# Patient Record
Sex: Female | Born: 1970
Health system: Southern US, Community
[De-identification: ages and names within clinical notes are randomized; demographics above are authoritative.]

## PROBLEM LIST (undated history)

## (undated) DIAGNOSIS — R112 Nausea with vomiting, unspecified: Secondary | ICD-10-CM

## (undated) DIAGNOSIS — I10 Essential (primary) hypertension: Secondary | ICD-10-CM

## (undated) DIAGNOSIS — Z9889 Other specified postprocedural states: Secondary | ICD-10-CM

## (undated) DIAGNOSIS — R55 Syncope and collapse: Secondary | ICD-10-CM

## (undated) DIAGNOSIS — T7840XA Allergy, unspecified, initial encounter: Secondary | ICD-10-CM

## (undated) DIAGNOSIS — R002 Palpitations: Secondary | ICD-10-CM

## (undated) DIAGNOSIS — R51 Headache: Secondary | ICD-10-CM

## (undated) HISTORY — PX: ABDOMINAL HYSTERECTOMY: SHX81

## (undated) HISTORY — DX: Palpitations: R00.2

## (undated) HISTORY — PX: LUMBAR DISC SURGERY: SHX700

## (undated) HISTORY — DX: Allergy, unspecified, initial encounter: T78.40XA

---

## 2008-03-29 ENCOUNTER — Encounter: Admission: RE | Admit: 2008-03-29 | Discharge: 2008-03-29 | Payer: Self-pay | Admitting: Family Medicine

## 2008-04-14 ENCOUNTER — Encounter: Admission: RE | Admit: 2008-04-14 | Discharge: 2008-04-14 | Payer: Self-pay | Admitting: Family Medicine

## 2009-07-09 ENCOUNTER — Encounter: Admission: RE | Admit: 2009-07-09 | Discharge: 2009-07-09 | Payer: Self-pay | Admitting: Family Medicine

## 2009-07-11 DIAGNOSIS — E559 Vitamin D deficiency, unspecified: Secondary | ICD-10-CM | POA: Insufficient documentation

## 2009-09-01 DIAGNOSIS — R55 Syncope and collapse: Secondary | ICD-10-CM

## 2009-09-01 HISTORY — PX: OTHER SURGICAL HISTORY: SHX169

## 2009-09-01 HISTORY — DX: Syncope and collapse: R55

## 2009-11-24 IMAGING — MG MM SCREENING MAMMOGRAM
4 series · 4 of 4 positions shown · non-contrast
Comparison: none

DG SCREEN MAMMOGRAM BILATERAL
Bilateral CC and MLO view(s) were taken.
Technologist: Giorgi Jumper

DIGITAL SCREENING MAMMOGRAM WITH CAD:
There are scattered fibroglandular densities.  Multiple nodules are noted in both breasts.  Spot 
compression views and possibly sonography are recommended for further evaluation.

[R CC]
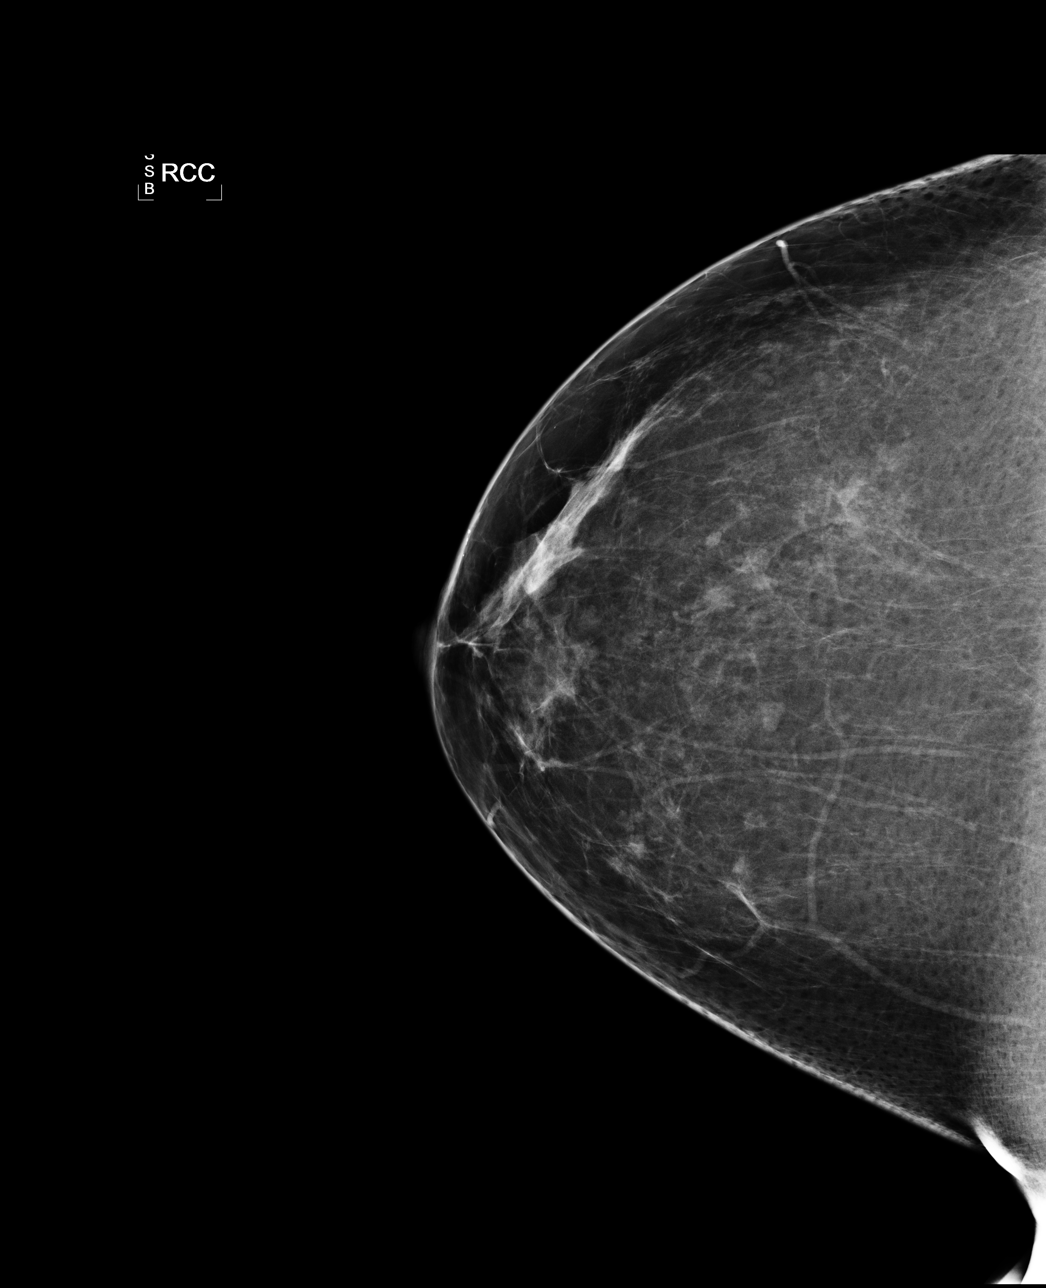

[L CC]
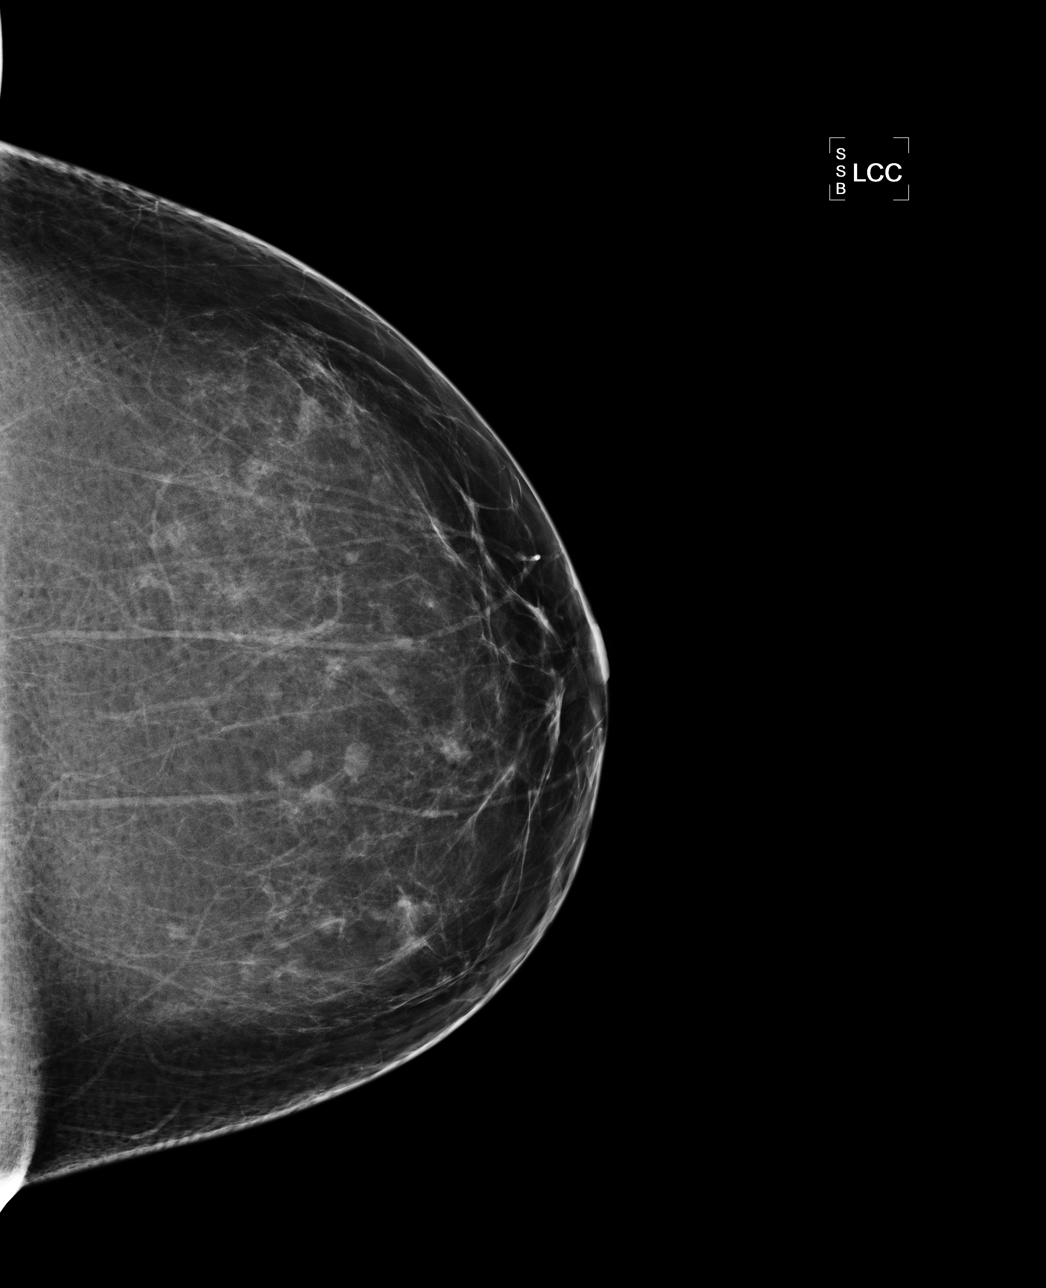

[L MLO]
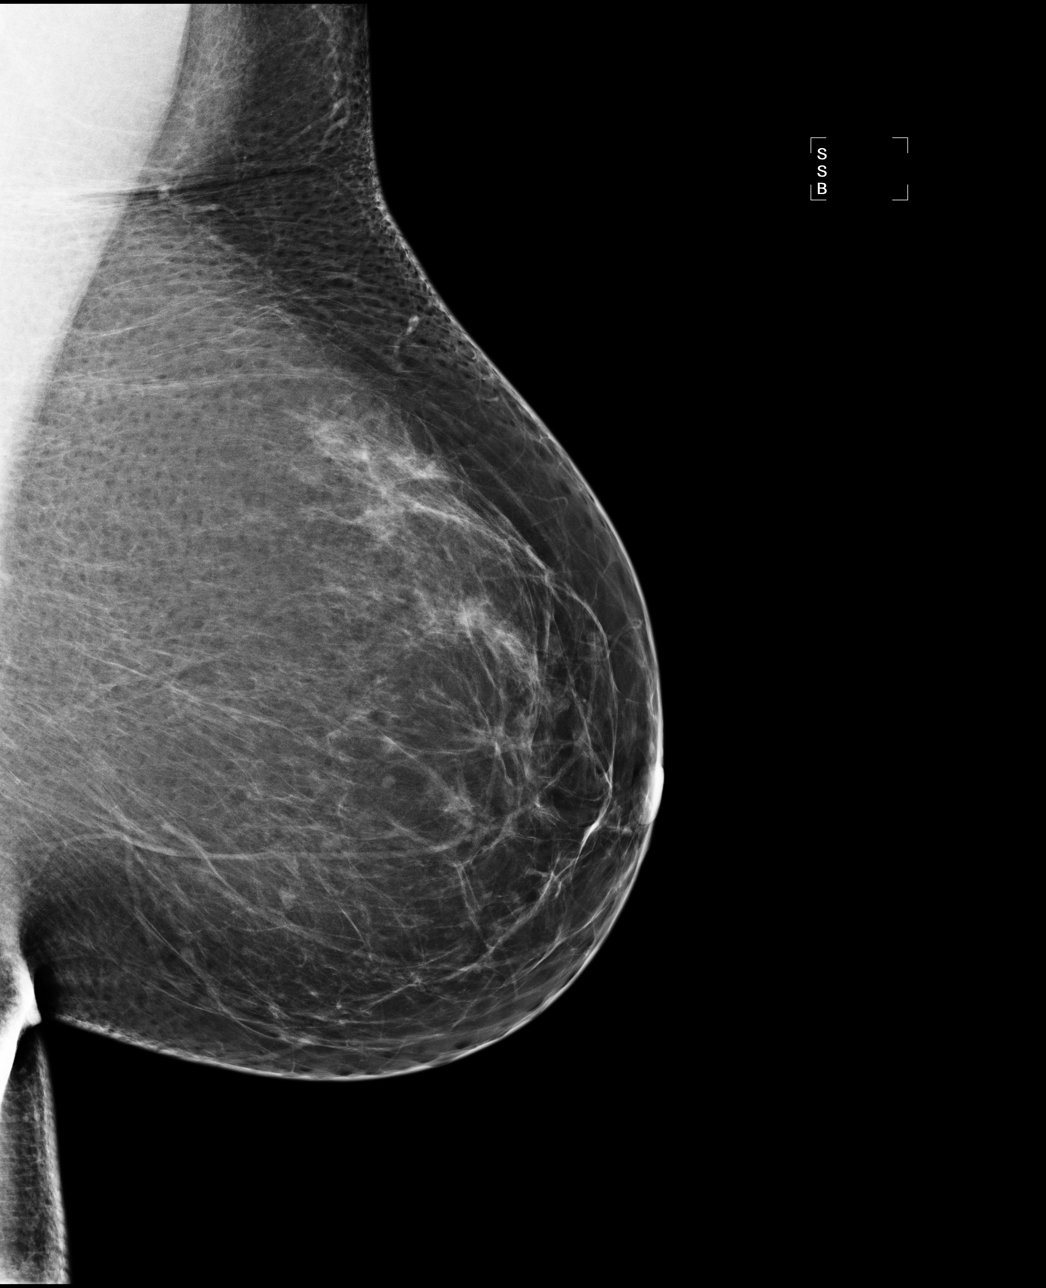

[R MLO]
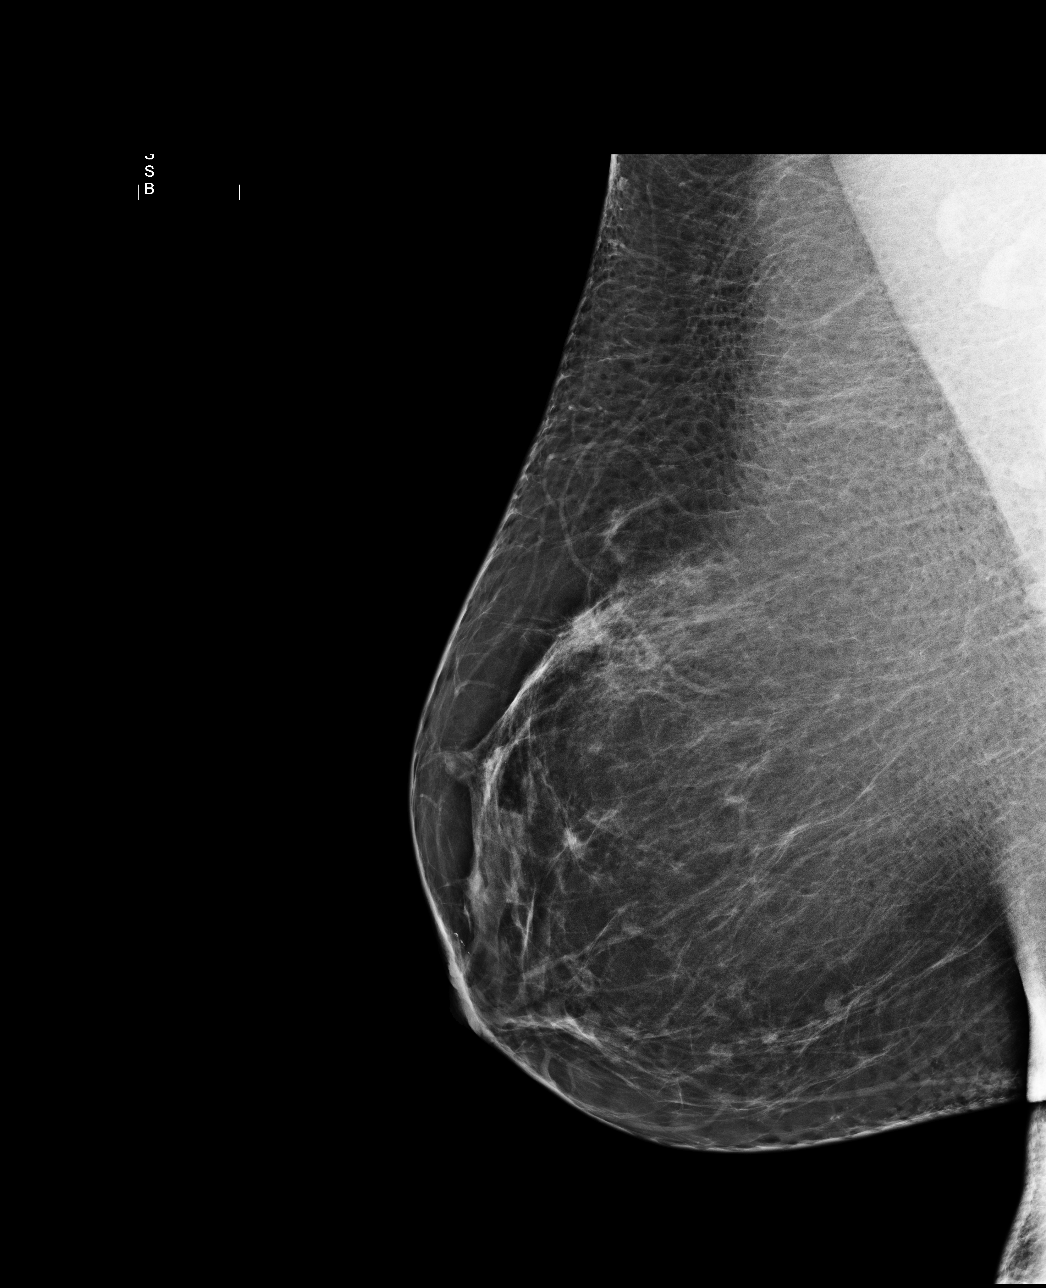

[4 of 4 positions shown; findings below may reference images not displayed]

IMPRESSION: Possible nodules, bilateral breasts.  Additional evaluation is indicated.  The patient will be 
contacted for additional studies and a supplementary report will follow.

ASSESSMENT: Need additional imaging evaluation and/or prior mammograms for comparison - BI-RADS 0

Further imaging of both breasts.
ANALYZED BY COMPUTER AIDED DETECTION. , THIS PROCEDURE WAS A DIGITAL MAMMOGRAM.

## 2010-09-21 ENCOUNTER — Other Ambulatory Visit: Payer: Self-pay | Admitting: Obstetrics

## 2010-09-21 DIAGNOSIS — Z0000 Encounter for general adult medical examination without abnormal findings: Secondary | ICD-10-CM

## 2010-09-21 DIAGNOSIS — Z1231 Encounter for screening mammogram for malignant neoplasm of breast: Secondary | ICD-10-CM

## 2010-10-03 ENCOUNTER — Ambulatory Visit: Payer: Self-pay

## 2010-10-10 ENCOUNTER — Ambulatory Visit
Admission: RE | Admit: 2010-10-10 | Discharge: 2010-10-10 | Disposition: A | Discharge status: Home / Self Care | Payer: PRIVATE HEALTH INSURANCE | Source: Ambulatory Visit | Attending: Obstetrics | Admitting: Obstetrics

## 2010-10-10 DIAGNOSIS — Z1231 Encounter for screening mammogram for malignant neoplasm of breast: Secondary | ICD-10-CM

## 2011-06-03 ENCOUNTER — Encounter: Payer: PRIVATE HEALTH INSURANCE | Admitting: Family Medicine

## 2011-06-03 NOTE — Progress Notes (Signed)
This encounter was created in error - please disregard.

## 2011-11-03 ENCOUNTER — Encounter (HOSPITAL_COMMUNITY): Payer: Self-pay | Admitting: Pharmacist

## 2011-11-14 ENCOUNTER — Other Ambulatory Visit: Payer: Self-pay | Admitting: Obstetrics

## 2011-11-17 ENCOUNTER — Other Ambulatory Visit: Payer: Self-pay

## 2011-11-17 ENCOUNTER — Encounter (HOSPITAL_COMMUNITY)
Admission: RE | Admit: 2011-11-17 | Discharge: 2011-11-17 | Disposition: A | Discharge status: Home / Self Care | Payer: No Typology Code available for payment source | Source: Ambulatory Visit | Attending: Obstetrics | Admitting: Obstetrics

## 2011-11-17 ENCOUNTER — Encounter (HOSPITAL_COMMUNITY): Payer: Self-pay

## 2011-11-17 HISTORY — DX: Syncope and collapse: R55

## 2011-11-17 HISTORY — DX: Nausea with vomiting, unspecified: R11.2

## 2011-11-17 HISTORY — DX: Headache: R51

## 2011-11-17 HISTORY — DX: Essential (primary) hypertension: I10

## 2011-11-17 HISTORY — DX: Nausea with vomiting, unspecified: Z98.890

## 2011-11-17 LAB — BASIC METABOLIC PANEL
CO2: 29 mEq/L (ref 19–32)
GFR calc non Af Amer: >90 mL/min (ref >90)
Glucose, Bld: 71 mg/dL (ref 70–99)
Potassium: 3.4 mEq/L — ABNORMAL LOW (ref 3.5–5.1)
Sodium: 141 mEq/L (ref 135–145)

## 2011-11-17 LAB — CBC
Hemoglobin: 14 g/dL (ref 12.0–15.0)
MCH: 27 pg (ref 26.0–34.0)
Platelets: 224 10*3/uL (ref 150–400)
RBC: 5.18 MIL/uL — ABNORMAL HIGH (ref 3.87–5.11)
WBC: 6.4 10*3/uL (ref 4.0–10.5)

## 2011-11-17 LAB — SURGICAL PCR SCREEN: Staphylococcus aureus: NEGATIVE

## 2011-11-17 NOTE — Patient Instructions (Addendum)
   Your procedure is scheduled on: Monday March 25th  Enter through the Main Entrance of [facility] at: [employer] up the phone at the desk and dial [phone] and inform us of your arrival.  Please call this number if you have any problems the morning of surgery: [phone]  Remember: Do not eat food after midnight: Sunday Do not drink clear liquids after: midnight Sunday Take these medicines the morning of surgery with a SIP OF WATER: lisinopril  Do not wear jewelry, make-up, or FINGER nail polish Do not wear lotions, powders, perfumes or deodorant. Do not shave 48 hours prior to surgery. Do not bring valuables to the hospital. Contacts, dentures or bridgework may not be worn into surgery.  Leave suitcase in the car. After Surgery it may be brought to your room. For patients being admitted to the hospital, checkout time is 11:00am the day of discharge.  Patients discharged on the day of surgery will not be allowed to drive home.     Remember to use your hibiclens as instructed.Please shower with 1/2 bottle the evening before your surgery and the other 1/2 bottle the morning of surgery. Neck down avoiding private area.

## 2011-11-23 MED ORDER — CEFAZOLIN SODIUM-DEXTROSE 2-3 GM-% IV SOLR
2.0000 g | Freq: Once | INTRAVENOUS | Status: AC
  Administered 2011-11-24: 2 g via INTRAVENOUS
  Filled 2011-11-23: qty 50

## 2011-11-24 ENCOUNTER — Encounter (HOSPITAL_COMMUNITY)
Admission: RE | Disposition: A | Discharge status: Home / Self Care | Payer: Self-pay | Source: Ambulatory Visit | Attending: Obstetrics

## 2011-11-24 ENCOUNTER — Encounter (HOSPITAL_COMMUNITY): Payer: Self-pay | Admitting: Anesthesiology

## 2011-11-24 ENCOUNTER — Ambulatory Visit (HOSPITAL_COMMUNITY)
Admission: RE | Admit: 2011-11-24 | Discharge: 2011-11-25 | Disposition: A | Discharge status: Home / Self Care | Payer: No Typology Code available for payment source | Source: Ambulatory Visit | Attending: Obstetrics | Admitting: Obstetrics

## 2011-11-24 ENCOUNTER — Encounter (HOSPITAL_COMMUNITY): Payer: Self-pay | Admitting: General Practice

## 2011-11-24 ENCOUNTER — Encounter (HOSPITAL_COMMUNITY): Payer: Self-pay

## 2011-11-24 ENCOUNTER — Ambulatory Visit (HOSPITAL_COMMUNITY): Payer: No Typology Code available for payment source | Admitting: Anesthesiology

## 2011-11-24 DIAGNOSIS — N920 Excessive and frequent menstruation with regular cycle: Secondary | ICD-10-CM | POA: Insufficient documentation

## 2011-11-24 DIAGNOSIS — Z01818 Encounter for other preprocedural examination: Secondary | ICD-10-CM | POA: Insufficient documentation

## 2011-11-24 DIAGNOSIS — I10 Essential (primary) hypertension: Secondary | ICD-10-CM | POA: Insufficient documentation

## 2011-11-24 DIAGNOSIS — Z01812 Encounter for preprocedural laboratory examination: Secondary | ICD-10-CM | POA: Insufficient documentation

## 2011-11-24 DIAGNOSIS — N946 Dysmenorrhea, unspecified: Secondary | ICD-10-CM | POA: Insufficient documentation

## 2011-11-24 LAB — TYPE AND SCREEN: Antibody Screen: NEGATIVE

## 2011-11-24 LAB — PREGNANCY, URINE: Preg Test, Ur: NEGATIVE

## 2011-11-24 LAB — ABO/RH: ABO/RH(D): A POS

## 2011-11-24 LAB — CBC
HCT: 41.6 % (ref 36.0–46.0)
MCHC: 32.5 g/dL (ref 30.0–36.0)
MCV: 83.9 fL (ref 78.0–100.0)
Platelets: 189 10*3/uL (ref 150–400)
RDW: 13.6 % (ref 11.5–15.5)

## 2011-11-24 SURGERY — ROBOTIC ASSISTED TOTAL HYSTERECTOMY
Anesthesia: General | Site: Abdomen | Wound class: Clean Contaminated

## 2011-11-24 MED ORDER — FENTANYL CITRATE 0.05 MG/ML IJ SOLN
INTRAMUSCULAR | Status: DC | PRN
  Administered 2011-11-24: 100 ug via INTRAVENOUS
  Administered 2011-11-24 (×3): 50 ug via INTRAVENOUS

## 2011-11-24 MED ORDER — LIDOCAINE HCL (CARDIAC) 20 MG/ML IV SOLN
INTRAVENOUS | Status: AC
  Filled 2011-11-24: qty 5

## 2011-11-24 MED ORDER — HYDROMORPHONE HCL PF 1 MG/ML IJ SOLN
INTRAMUSCULAR | Status: DC | PRN
  Administered 2011-11-24: 1 mg via INTRAVENOUS

## 2011-11-24 MED ORDER — KETOROLAC TROMETHAMINE 30 MG/ML IJ SOLN
15.0000 mg | Freq: Once | INTRAMUSCULAR | Status: AC | PRN
  Administered 2011-11-24: 30 mg via INTRAVENOUS

## 2011-11-24 MED ORDER — ONDANSETRON HCL 4 MG/2ML IJ SOLN: 4.0000 mg | Freq: Four times a day (QID) | INTRAMUSCULAR | Status: DC | PRN

## 2011-11-24 MED ORDER — SCOPOLAMINE 1 MG/3DAYS TD PT72
1.0000 | MEDICATED_PATCH | Freq: Once | TRANSDERMAL | Status: DC | PRN
  Administered 2011-11-24: 1.5 mg via TRANSDERMAL

## 2011-11-24 MED ORDER — GLYCOPYRROLATE 0.2 MG/ML IJ SOLN
INTRAMUSCULAR | Status: DC | PRN
  Administered 2011-11-24: .8 mg via INTRAVENOUS
  Administered 2011-11-24: 0.1 mg via INTRAVENOUS
  Administered 2011-11-24: 0.3 mg via INTRAVENOUS

## 2011-11-24 MED ORDER — ARTIFICIAL TEARS OP OINT
TOPICAL_OINTMENT | OPHTHALMIC | Status: DC | PRN
  Administered 2011-11-24: 1 via OPHTHALMIC

## 2011-11-24 MED ORDER — MENTHOL 3 MG MT LOZG: 1.0000 | LOZENGE | OROMUCOSAL | Status: DC | PRN

## 2011-11-24 MED ORDER — KETOROLAC TROMETHAMINE 30 MG/ML IJ SOLN
30.0000 mg | Freq: Four times a day (QID) | INTRAMUSCULAR | Status: DC
  Filled 2011-11-24: qty 1

## 2011-11-24 MED ORDER — IBUPROFEN 600 MG PO TABS: 600.0000 mg | ORAL_TABLET | Freq: Four times a day (QID) | ORAL | Status: DC | PRN

## 2011-11-24 MED ORDER — FENTANYL CITRATE 0.05 MG/ML IJ SOLN
25.0000 ug | INTRAMUSCULAR | Status: DC | PRN
  Administered 2011-11-24 (×2): 50 ug via INTRAVENOUS

## 2011-11-24 MED ORDER — DEXAMETHASONE SODIUM PHOSPHATE 10 MG/ML IJ SOLN
INTRAMUSCULAR | Status: AC
  Filled 2011-11-24: qty 1

## 2011-11-24 MED ORDER — ACETAMINOPHEN 325 MG PO TABS: 325.0000 mg | ORAL_TABLET | ORAL | Status: DC | PRN

## 2011-11-24 MED ORDER — NEOSTIGMINE METHYLSULFATE 1 MG/ML IJ SOLN
INTRAMUSCULAR | Status: DC | PRN
  Administered 2011-11-24: 4 mg via INTRAVENOUS

## 2011-11-24 MED ORDER — KETOROLAC TROMETHAMINE 30 MG/ML IJ SOLN
30.0000 mg | Freq: Four times a day (QID) | INTRAMUSCULAR | Status: DC
  Administered 2011-11-24 – 2011-11-25 (×3): 30 mg via INTRAVENOUS
  Filled 2011-11-24 (×2): qty 1

## 2011-11-24 MED ORDER — SCOPOLAMINE 1 MG/3DAYS TD PT72
MEDICATED_PATCH | TRANSDERMAL | Status: AC
  Administered 2011-11-24: 1.5 mg via TRANSDERMAL
  Filled 2011-11-24: qty 1

## 2011-11-24 MED ORDER — KETOROLAC TROMETHAMINE 30 MG/ML IJ SOLN
INTRAMUSCULAR | Status: AC
  Administered 2011-11-24: 30 mg via INTRAVENOUS
  Filled 2011-11-24: qty 1

## 2011-11-24 MED ORDER — EPHEDRINE 5 MG/ML INJ
INTRAVENOUS | Status: AC
  Filled 2011-11-24: qty 10

## 2011-11-24 MED ORDER — SODIUM CHLORIDE 0.9 % IV SOLN: INTRAVENOUS | Status: DC

## 2011-11-24 MED ORDER — LIDOCAINE HCL (CARDIAC) 20 MG/ML IV SOLN
INTRAVENOUS | Status: DC | PRN
  Administered 2011-11-24: 60 mg via INTRAVENOUS

## 2011-11-24 MED ORDER — PROPOFOL 10 MG/ML IV EMUL
INTRAVENOUS | Status: AC
  Filled 2011-11-24: qty 20

## 2011-11-24 MED ORDER — ROCURONIUM BROMIDE 100 MG/10ML IV SOLN
INTRAVENOUS | Status: DC | PRN
  Administered 2011-11-24: 45 mg via INTRAVENOUS
  Administered 2011-11-24: 5 mg via INTRAVENOUS
  Administered 2011-11-24: 20 mg via INTRAVENOUS

## 2011-11-24 MED ORDER — EPHEDRINE SULFATE 50 MG/ML IJ SOLN
INTRAMUSCULAR | Status: DC | PRN
  Administered 2011-11-24: 10 mg via INTRAVENOUS

## 2011-11-24 MED ORDER — MIDAZOLAM HCL 2 MG/2ML IJ SOLN: 0.5000 mg | Freq: Once | INTRAMUSCULAR | Status: DC | PRN

## 2011-11-24 MED ORDER — GLYCOPYRROLATE 0.2 MG/ML IJ SOLN
INTRAMUSCULAR | Status: AC
  Filled 2011-11-24: qty 1

## 2011-11-24 MED ORDER — PROPOFOL 10 MG/ML IV EMUL
INTRAVENOUS | Status: DC | PRN
  Administered 2011-11-24: 160 mg via INTRAVENOUS

## 2011-11-24 MED ORDER — HYDROMORPHONE HCL PF 1 MG/ML IJ SOLN
INTRAMUSCULAR | Status: AC
  Filled 2011-11-24: qty 1

## 2011-11-24 MED ORDER — LISINOPRIL 20 MG PO TABS
30.0000 mg | ORAL_TABLET | Freq: Every morning | ORAL | Status: DC
  Administered 2011-11-24: 30 mg via ORAL
  Filled 2011-11-24 (×2): qty 1

## 2011-11-24 MED ORDER — HYDROMORPHONE HCL PF 1 MG/ML IJ SOLN: 1.0000 mg | INTRAMUSCULAR | Status: DC | PRN

## 2011-11-24 MED ORDER — OXYCODONE-ACETAMINOPHEN 5-325 MG PO TABS
1.0000 | ORAL_TABLET | ORAL | Status: DC | PRN
  Administered 2011-11-24 (×2): 1 via ORAL
  Administered 2011-11-25: 2 via ORAL
  Filled 2011-11-24: qty 2
  Filled 2011-11-24 (×2): qty 1

## 2011-11-24 MED ORDER — MIDAZOLAM HCL 2 MG/2ML IJ SOLN
INTRAMUSCULAR | Status: AC
  Filled 2011-11-24: qty 2

## 2011-11-24 MED ORDER — NEOSTIGMINE METHYLSULFATE 1 MG/ML IJ SOLN
INTRAMUSCULAR | Status: AC
  Filled 2011-11-24: qty 10

## 2011-11-24 MED ORDER — ONDANSETRON HCL 4 MG/2ML IJ SOLN
INTRAMUSCULAR | Status: AC
  Filled 2011-11-24: qty 2

## 2011-11-24 MED ORDER — GLYCOPYRROLATE 0.2 MG/ML IJ SOLN
INTRAMUSCULAR | Status: AC
  Filled 2011-11-24: qty 2

## 2011-11-24 MED ORDER — PROMETHAZINE HCL 25 MG/ML IJ SOLN: 6.2500 mg | INTRAMUSCULAR | Status: DC | PRN

## 2011-11-24 MED ORDER — ONDANSETRON HCL 4 MG/2ML IJ SOLN
INTRAMUSCULAR | Status: DC | PRN
  Administered 2011-11-24: 4 mg via INTRAVENOUS

## 2011-11-24 MED ORDER — ONDANSETRON HCL 4 MG PO TABS: 4.0000 mg | ORAL_TABLET | Freq: Four times a day (QID) | ORAL | Status: DC | PRN

## 2011-11-24 MED ORDER — DEXAMETHASONE SODIUM PHOSPHATE 10 MG/ML IJ SOLN
INTRAMUSCULAR | Status: DC | PRN
  Administered 2011-11-24: 10 mg via INTRAVENOUS

## 2011-11-24 MED ORDER — BUPIVACAINE HCL (PF) 0.25 % IJ SOLN
INTRAMUSCULAR | Status: AC
  Filled 2011-11-24: qty 30

## 2011-11-24 MED ORDER — MEPERIDINE HCL 25 MG/ML IJ SOLN: 6.2500 mg | INTRAMUSCULAR | Status: DC | PRN

## 2011-11-24 MED ORDER — ZOLPIDEM TARTRATE 5 MG PO TABS: 5.0000 mg | ORAL_TABLET | Freq: Every evening | ORAL | Status: DC | PRN

## 2011-11-24 MED ORDER — FENTANYL CITRATE 0.05 MG/ML IJ SOLN
INTRAMUSCULAR | Status: AC
  Administered 2011-11-24: 50 ug via INTRAVENOUS
  Filled 2011-11-24: qty 2

## 2011-11-24 MED ORDER — MIDAZOLAM HCL 5 MG/5ML IJ SOLN
INTRAMUSCULAR | Status: DC | PRN
  Administered 2011-11-24: 2 mg via INTRAVENOUS

## 2011-11-24 MED ORDER — BUPIVACAINE HCL (PF) 0.25 % IJ SOLN
INTRAMUSCULAR | Status: DC | PRN
  Administered 2011-11-24: 13 mL

## 2011-11-24 MED ORDER — LACTATED RINGERS IV SOLN
INTRAVENOUS | Status: DC
  Administered 2011-11-24 (×2): via INTRAVENOUS
  Administered 2011-11-24: 125 mL/h via INTRAVENOUS

## 2011-11-24 MED ORDER — ROCURONIUM BROMIDE 50 MG/5ML IV SOLN
INTRAVENOUS | Status: AC
  Filled 2011-11-24: qty 2

## 2011-11-24 MED ORDER — PANTOPRAZOLE SODIUM 40 MG PO TBEC
40.0000 mg | DELAYED_RELEASE_TABLET | Freq: Every day | ORAL | Status: DC
  Filled 2011-11-24 (×2): qty 1

## 2011-11-24 MED ORDER — FENTANYL CITRATE 0.05 MG/ML IJ SOLN
INTRAMUSCULAR | Status: AC
  Filled 2011-11-24: qty 5

## 2011-11-24 SURGICAL SUPPLY — 72 items
BAG URINE DRAINAGE (UROLOGICAL SUPPLIES) ×4 IMPLANT
BARRIER ADHS 3X4 INTERCEED (GAUZE/BANDAGES/DRESSINGS) IMPLANT
BLADE LAPAROSCOPIC MORCELL KIT (BLADE) IMPLANT
CABLE HIGH FREQUENCY MONO STRZ (ELECTRODE) ×4 IMPLANT
CATH FOLEY 3WAY  5CC 16FR (CATHETERS) ×1
CATH FOLEY 3WAY 5CC 16FR (CATHETERS) ×3 IMPLANT
CHLORAPREP W/TINT 26ML (MISCELLANEOUS) ×4 IMPLANT
CLOTH BEACON ORANGE TIMEOUT ST (SAFETY) ×4 IMPLANT
CONT PATH 16OZ SNAP LID 3702 (MISCELLANEOUS) ×4 IMPLANT
COVER MAYO STAND STRL (DRAPES) ×4 IMPLANT
COVER TABLE BACK 60X90 (DRAPES) ×8 IMPLANT
COVER TIP SHEARS 8 DVNC (MISCELLANEOUS) ×3 IMPLANT
COVER TIP SHEARS 8MM DA VINCI (MISCELLANEOUS) ×1
DECANTER SPIKE VIAL GLASS SM (MISCELLANEOUS) ×4 IMPLANT
DERMABOND ADVANCED (GAUZE/BANDAGES/DRESSINGS) ×1
DERMABOND ADVANCED .7 DNX12 (GAUZE/BANDAGES/DRESSINGS) ×3 IMPLANT
DRAPE HUG U DISPOSABLE (DRAPE) ×4 IMPLANT
DRAPE LG THREE QUARTER DISP (DRAPES) ×8 IMPLANT
DRAPE MONITOR DA VINCI (DRAPE) ×4 IMPLANT
DRAPE WARM FLUID 44X44 (DRAPE) ×4 IMPLANT
ELECT REM PT RETURN 9FT ADLT (ELECTROSURGICAL) ×4
ELECTRODE REM PT RTRN 9FT ADLT (ELECTROSURGICAL) ×3 IMPLANT
EVACUATOR SMOKE 8.L (FILTER) ×4 IMPLANT
GAUZE VASELINE 3X9 (GAUZE/BANDAGES/DRESSINGS) IMPLANT
GLOVE BIO SURGEON STRL SZ 6.5 (GLOVE) ×16 IMPLANT
GLOVE BIO SURGEON STRL SZ7.5 (GLOVE) ×4 IMPLANT
GLOVE BIOGEL PI IND STRL 7.0 (GLOVE) ×9 IMPLANT
GLOVE BIOGEL PI INDICATOR 7.0 (GLOVE) ×3
GLOVE ECLIPSE 6.0 STRL STRAW (GLOVE) ×4 IMPLANT
GLOVE ECLIPSE 7.0 STRL STRAW (GLOVE) ×4 IMPLANT
GLOVE SS N UNI LF 7.0 STRL (GLOVE) ×4 IMPLANT
GOWN STRL REIN XL XLG (GOWN DISPOSABLE) ×32 IMPLANT
IV STOPCOCK 4 WAY 40  W/Y SET (IV SOLUTION) ×1
IV STOPCOCK 4 WAY 40 W/Y SET (IV SOLUTION) ×3 IMPLANT
KIT ACCESSORY DA VINCI DISP (KITS)
KIT ACCESSORY DVNC DISP (KITS) IMPLANT
KIT DISP ACCESSORY 4 ARM (KITS) ×4 IMPLANT
NEEDLE HYPO 22GX1.5 SAFETY (NEEDLE) IMPLANT
NEEDLE INSUFFLATION 14GA 120MM (NEEDLE) IMPLANT
OCCLUDER COLPOPNEUMO (BALLOONS) ×4 IMPLANT
PACK LAVH (CUSTOM PROCEDURE TRAY) ×4 IMPLANT
PAD PREP 24X48 CUFFED NSTRL (MISCELLANEOUS) ×8 IMPLANT
PLUG CATH AND CAP STER (CATHETERS) ×4 IMPLANT
PROTECTOR NERVE ULNAR (MISCELLANEOUS) ×8 IMPLANT
SET IRRIG TUBING LAPAROSCOPIC (IRRIGATION / IRRIGATOR) ×4 IMPLANT
SOLUTION ELECTROLUBE (MISCELLANEOUS) ×4 IMPLANT
SPONGE LAP 18X18 X RAY DECT (DISPOSABLE) IMPLANT
SUT VIC AB 0 CT1 27 (SUTURE) ×2
SUT VIC AB 0 CT1 27XBRD ANBCTR (SUTURE) ×6 IMPLANT
SUT VIC AB 0 CT1 27XBRD ANTBC (SUTURE) IMPLANT
SUT VIC AB 0 CT2 27 (SUTURE) IMPLANT
SUT VIC AB 2-0 CT1 27 (SUTURE)
SUT VIC AB 2-0 CT1 TAPERPNT 27 (SUTURE) IMPLANT
SUT VIC AB 4-0 PS2 27 (SUTURE) ×8 IMPLANT
SUT VICRYL 0 27 CT2 27 ABS (SUTURE) ×24 IMPLANT
SUT VICRYL 0 UR6 27IN ABS (SUTURE) ×8 IMPLANT
SYR 50ML LL SCALE MARK (SYRINGE) ×4 IMPLANT
SYSTEM CONVERTIBLE TROCAR (TROCAR) ×4 IMPLANT
TIP UTERINE 5.1X6CM LAV DISP (MISCELLANEOUS) IMPLANT
TIP UTERINE 6.7X10CM GRN DISP (MISCELLANEOUS) IMPLANT
TIP UTERINE 6.7X6CM WHT DISP (MISCELLANEOUS) IMPLANT
TIP UTERINE 6.7X8CM BLUE DISP (MISCELLANEOUS) ×4 IMPLANT
TOWEL OR 17X24 6PK STRL BLUE (TOWEL DISPOSABLE) ×12 IMPLANT
TROCAR 12M 150ML BLUNT (TROCAR) IMPLANT
TROCAR DISP BLADELESS 8 DVNC (TROCAR) ×3 IMPLANT
TROCAR DISP BLADELESS 8MM (TROCAR) ×1
TROCAR XCEL 12X100 BLDLESS (ENDOMECHANICALS) ×4 IMPLANT
TROCAR XCEL NON-BLD 5MMX100MML (ENDOMECHANICALS) ×4 IMPLANT
TROCAR Z-THREAD BLADED 12X100M (TROCAR) ×4 IMPLANT
TUBING FILTER THERMOFLATOR (ELECTROSURGICAL) ×8 IMPLANT
WARMER LAPAROSCOPE (MISCELLANEOUS) ×4 IMPLANT
WATER STERILE IRR 1000ML POUR (IV SOLUTION) ×12 IMPLANT

## 2011-11-24 NOTE — Anesthesia Preprocedure Evaluation (Signed)
Anesthesia Evaluation  Patient identified by MRN, date of birth, ID band Patient awake    Reviewed: Allergy & Precautions, H&P , Patient's Chart, lab work & pertinent test results, reviewed documented beta blocker date and time   History of Anesthesia Complications (+) PONV  Airway Mallampati: II TM Distance: >3 FB Neck ROM: full    Dental No notable dental hx.    Pulmonary neg pulmonary ROS,  breath sounds clear to auscultation  Pulmonary exam normal       Cardiovascular Exercise Tolerance: Good hypertension, negative cardio ROS  Rhythm:regular Rate:Normal     Neuro/Psych negative neurological ROS  negative psych ROS   GI/Hepatic negative GI ROS, Neg liver ROS,   Endo/Other  negative endocrine ROS  Renal/GU negative Renal ROS     Musculoskeletal   Abdominal   Peds  Hematology negative hematology ROS (+)   Anesthesia Other Findings PONV (postoperative nausea and vomiting)   vagal response per pt Hypertension   on lisinopril-will do ekg today    Headache   history of migraines Fainting episodes 2011 history of with blood draw, mammogram, post op 2011       Reproductive/Obstetrics negative OB ROS                           Anesthesia Physical Anesthesia Plan  ASA: II  Anesthesia Plan: General ETT   Post-op Pain Management:    Induction:   Airway Management Planned:   Additional Equipment:   Intra-op Plan:   Post-operative Plan:   Informed Consent: I have reviewed the patients History and Physical, chart, labs and discussed the procedure including the risks, benefits and alternatives for the proposed anesthesia with the patient or authorized representative who has indicated his/her understanding and acceptance.   Dental Advisory Given  Plan Discussed with: CRNA, Surgeon and Anesthesiologist  Anesthesia Plan Comments:         Anesthesia Quick Evaluation

## 2011-11-24 NOTE — H&P (Signed)
See full H&P under media tab  In short, 41 yo G0 w/ dysmenorrhea and menorrhagia. Nl pap, nl embx, nl appearing uterus. Pt tried IUD which made pain worse, despite proper placement. Not interested in hormonal management due to htn. Does not like the idea of ablation.  PMH: htn PSH: none PObH: G0, no h/o infertility, never attempted preg  Meds: lisinopril All: NKDA  PE: Filed Vitals:   11/24/11 0611  BP: 149/88  Pulse: 80  Temp: 98.1 F (36.7 C)  TempSrc: Oral  Resp: 16  SpO2: 100%   In office: CV- RRR Pulm: CTAB GU: 7 cm mobile uterus, limited cervical descent, nl vagina, no adenxal masses  Today: Abd: soft, NT LE: SCDs in place, no edema  CBC    Component Value Date/Time   WBC 6.4 11/17/2011 0846   RBC 5.18* 11/17/2011 0846   HGB 14.0 11/17/2011 0846   HCT 43.4 11/17/2011 0846   PLT 224 11/17/2011 0846   MCV 83.8 11/17/2011 0846   MCH 27.0 11/17/2011 0846   MCHC 32.3 11/17/2011 0846   RDW 13.5 11/17/2011 0846   A/P: 41 yo G0 for robotic assisted TLH w/ retention of ovaries for menorrhagia and dysmenorrhea - pt understands risks of surgery, including possible longer surgery w/ robotic approach leading to facial/ tracheal edema and possible difficult extubation. Pt also understands alternatives to surgery, that she has not exhausted all options and risks of bleeding, infections and surrounding damage. - htn, will restart meds post-op.  Chole Driver A. 11/24/2011 7:44 AM

## 2011-11-24 NOTE — Transfer of Care (Signed)
Immediate Anesthesia Transfer of Care Note  Patient: Tanya Carr  Procedure(s) Performed: Procedure(s) (LRB): ROBOTIC ASSISTED TOTAL HYSTERECTOMY (N/A) BILATERAL SALPINGECTOMY (Bilateral) ROBOTIC ASSISTED LAPAROSCOPIC LYSIS OF ADHESION (N/A)  Patient Location: PACU  Anesthesia Type: General  Level of Consciousness: awake, alert  and oriented  Airway & Oxygen Therapy: Patient Spontanous Breathing and Patient connected to nasal cannula oxygen  Post-op Assessment: Report given to PACU RN and Post -op Vital signs reviewed and stable  Post vital signs: Reviewed and stable  Complications: No apparent anesthesia complications

## 2011-11-24 NOTE — Addendum Note (Signed)
Addendum  created 11/24/11 1754 by Shanon Payor, CRNA   Modules edited:Notes Section

## 2011-11-24 NOTE — Op Note (Signed)
11/24/2011  10:39 AM  PATIENT:  Tanya Carr  41 y.o. female  PRE-OPERATIVE DIAGNOSIS:  Pelvic Pain, Dysmenorrhea  POST-OPERATIVE DIAGNOSIS:  Pelvic Pain, Dysmenorrhea  PROCEDURE:  Procedure(s) (LRB): ROBOTIC ASSISTED TOTAL HYSTERECTOMY (N/A) BILATERAL SALPINGECTOMY (Bilateral) ROBOTIC ASSISTED LAPAROSCOPIC LYSIS OF ADHESION (N/A)  SURGEON:  Surgeon(s) and Role:    Tresa Endo A. Ernestina Penna, MD - Primary    * Genia Del, MD - Assisting  PHYSICIAN ASSISTANT:   ASSISTANTS: Lavoie   ANESTHESIA:   general  EBL:  Total I/O In: 1000 [I.V.:1000] Out: 275 [Urine:200; Blood:75]  BLOOD ADMINISTERED:none and 0 CC PRBC  DRAINS: Urinary Catheter (Foley)   LOCAL MEDICATIONS USED:  MARCAINE     SPECIMEN:  Source of Specimen:  uterus, cervix, b/l tubes  DISPOSITION OF SPECIMEN:  PATHOLOGY Findings: nl uterus, tubes and ovaries, small adhesion L tube to ovary. Small bowel with filmy adhesions to RLQ side wall COUNTS:  YES  Abx: 1g Ancef Complications: none Indications: 41 yo G0 w/ bothersome menorrhagia and dsymenorrhea with limited uterine descent, failed trial of IUD and hypertension as the contraindication for OCs.   Procedure:Robotic-assisted total laparoscopic hysterectomy .  Procedure: After informed consent and discussion of alternatives to hysterectomy, the patient was taken to the operating room where general anesthesia was initiated without difficulty. She was prepped and draped in normal sterile fashion in the dorsal supine lithotomy position. A Foley catheter was inserted sterilely into the bladder. A bimanual examination was done to assess the size and position of the uterus.. A vaginal wall retractor was used on the anterior and posterior vagina and  the cervix was grasped with tenaculum. The cervix was sounded with the uterine sound. It sounded to 8 cm. The cervix was assessed to identify the Rumi-Co size.A small cup and an 8 cm shaft was used. This was easily  threaded through the cervix, into the uterus and the cup seated nicely around the cervix. The uterine balloon was inflated.  Gloved were changed. Attention was then turned to the patient's abdomen. 0.5 % marcaine was used prior to all incision. A total of 20 cc of marcaine was used.  A 10 mm incision was made in the umbilicus and blunt and sharp dissection was done until the fascia was identified. This was then grasped with Kocher clamps x2 and entered sharply. A pursestring suture of 0 Vicryl was then placed along the incision and a non-bladed Roseanne Reno was inserted into the peritoneal cavity. Intraperitoneal placement was confirmed with the use of the camera and pneumoperitoneum was created to 15 mm of mercury. The pursestring suture was secured around the port and pneumoperitoneum was maintained. Brief survey of the abdomen and pelvis was done with findings as above. The abdominal wall was assessed and additional port sites were marked. 8 mm incisions were placed in the right (two) and left lower quadrants (1) and non-bladed ports were placed under direct visualization. An additional 5 mm incision was made in LLQ and 5mm non-bladed port was placed under direct visualization.  The robot was brought to the patient's side and attached with the right side docking. The robotic instruments were placed under direct visualization until proper placement just over the uterus.  I then went to the robotic console. Brief survey of the patient's abdomen and pelvis revealed  findings as above. Bilateral ureters were identified and seen peristalsing well low in the pelvic sidewall. Bilateral tubes and ovaries were normal. . I began on the right side: the  right  tube  was serially cauterized with bipolar cautery with the use of the PK device and transected with the monopolar scissors. The  right utero-ovarian ligament was serially cauterized with the bipolar PK and transected with the monopolar scissors. The anterior leaf of the  broad ligament was dissected bluntly then monopolar cautery was used to separate the anterior and posterior leafs of the broad ligament. This was carried down through to the bladder flap. Attention was then turned to the patient's  left  side: the  left t tube was freed with monopolar scissored from it's attachment at the L ovary. The mesosalpynx was then cauterized and transected to the level of the utero-ovarian ligament,  the  left  uterine ovarian ligament was serially cauterized with the PK and transected with the monopolar scissors. The tube remained attached to the uterus. The left  broad ligament was opened up the anterior and posterior leaves were dissected apart from each other. Monopolar cautery was used to come across the anterior leaf of the right broad ligament. This dissection was carried down across to the midline to create the bladder flap. At this point the R mesosalpynx was serially cauterized and transected. The tube was freed and placed in the post cul-de-sac.  The right uterine artery was serially cauterized with the PK and transected with the monopolar scissors. The left uterine artery and then serially cauterized with the PK and transected with the monopolar scissors. The uterus was placed on stretch to allow better visualization of the arteries. The bladder flap was further taken down both sharply and with cautery. Cautery was used for hemostasis on several places along the bladder flap. At this point with the pressure inward on the Rumi, the posterior  colpotomy was made with the monopolar scissors. This was carried around to the patient's right side. Additional bipolar cautery was needed on the  both angles of the cuff- this was carried out with the PK bipolar. The uterus was then positioned  posteriorly  to allow easy access to the  anterior  colpotomy. This was carried out with the monopolar scissors.  Good hemostasis was noted along the angles of the incision.  The uterus, cervix and  b/l tubes were pulled out through the vagina.   Irrigation was used and the vaginal cuff appeared hemostatic. An 0 Vicryl on a CT-2 to was then placed through the #3 laparoscopic port. Instruments were changed to allow a suture cut needle driver through the #1 port and and a long-tipped forceps through the #2 port. The right angle was closed in a figure-of-eight fashion. Another suture was placed and the left angle was closed with a figure-of-eight suture. 4 additional sutures were then thrown along the vaginal cuff, all figure of 8 sutures with 0-vicryl. Excellent hemostasis was noted. Suction irrigation was carried out and hemostasis was assured along the vaginal cuff. The utero-ovarian ligaments were reassessed also found to be hemostatic. All free fluid was removed from the abdomen. T  The PK and monopolar scissors were introduced and the RLQ adhesions were taken down. Good hemostasis was noted.   The robotic portion was completed. The robot was undocked.   By standard laparoscopy the pelvis was irrigated and evaluated. The patient was placed in reverse Trendelenburg, all additional fluid was suctioned from the abdomen and pelvis the cuff was reinspected and  found to be hemostatic. All pedicles were also found to be hemostatic. Under direct visualization the ports were removed. Pneumoperitoneum was released and the umbilical port was removed.  The  pursestring suture at the umbilicus was then closed. No additional fascial incisions were closed due to the small size and the nondilated nature of these incisions. A 4-0 Vicryl was used to close the additional laparoscopic ports sites. Good hemostasis was noted.  Sponge lap and needle counts were correct x3 the patient was woken from general anesthesia having tolerated the procedure well and taken to the recovery room in a stable fashion.

## 2011-11-24 NOTE — Brief Op Note (Signed)
11/24/2011  10:39 AM  PATIENT:  Tanya Carr  41 y.o. female  PRE-OPERATIVE DIAGNOSIS:  Pelvic Pain, Dysmenorrhea  POST-OPERATIVE DIAGNOSIS:  Pelvic Pain, Dysmenorrhea  PROCEDURE:  Procedure(s) (LRB): ROBOTIC ASSISTED TOTAL HYSTERECTOMY (N/A) BILATERAL SALPINGECTOMY (Bilateral) ROBOTIC ASSISTED LAPAROSCOPIC LYSIS OF ADHESION (N/A)  SURGEON:  Surgeon(s) and Role:    Tresa Endo A. Ernestina Penna, MD - Primary    * Genia Del, MD - Assisting  PHYSICIAN ASSISTANT:   ASSISTANTS: Lavoie   ANESTHESIA:   general  EBL:  Total I/O In: 1000 [I.V.:1000] Out: 275 [Urine:200; Blood:75]  BLOOD ADMINISTERED:none and 0 CC PRBC  DRAINS: Urinary Catheter (Foley)   LOCAL MEDICATIONS USED:  MARCAINE     SPECIMEN:  Source of Specimen:  uterus, cervix, b/l tubes  DISPOSITION OF SPECIMEN:  PATHOLOGY  COUNTS:  YES  TOURNIQUET:  * No tourniquets in log *  DICTATION: .Note written in EPIC  PLAN OF CARE: Admit for overnight observation  PATIENT DISPOSITION:  PACU - hemodynamically stable.   Delay start of Pharmacological VTE agent (>24hrs) due to surgical blood loss or risk of bleeding: yes

## 2011-11-24 NOTE — Anesthesia Postprocedure Evaluation (Signed)
  Anesthesia Post-op Note  Patient: Tanya Carr  Procedure(s) Performed: Procedure(s) (LRB): ROBOTIC ASSISTED TOTAL HYSTERECTOMY (N/A) BILATERAL SALPINGECTOMY (Bilateral) ROBOTIC ASSISTED LAPAROSCOPIC LYSIS OF ADHESION (N/A)  Patient is awake and responsive. Pain and nausea are reasonably well controlled. Vital signs are stable and clinically acceptable. Oxygen saturation is clinically acceptable. There are no apparent anesthetic complications at this time. Patient is ready for discharge.

## 2011-11-24 NOTE — Anesthesia Procedure Notes (Signed)
Procedure Name: Intubation Date/Time: 11/24/2011 8:00 AM Performed by: Graciela Husbands Pre-anesthesia Checklist: Suction available, Emergency Drugs available, Timeout performed, Patient identified and Patient being monitored Patient Re-evaluated:Patient Re-evaluated prior to inductionOxygen Delivery Method: Circle system utilized Preoxygenation: Pre-oxygenation with 100% oxygen Intubation Type: IV induction Ventilation: Mask ventilation without difficulty Laryngoscope Size: Mac and 3 Grade View: Grade I Tube type: Oral Tube size: 7.0 mm Number of attempts: 1 Airway Equipment and Method: Stylet Placement Confirmation: ETT inserted through vocal cords under direct vision,  positive ETCO2 and breath sounds checked- equal and bilateral Secured at: 20 cm Tube secured with: Tape Dental Injury: Teeth and Oropharynx as per pre-operative assessment

## 2011-11-24 NOTE — Addendum Note (Signed)
Addendum  created 11/24/11 1357 by Graciela Husbands, CRNA   Modules edited:Anesthesia Responsible Staff

## 2011-11-24 NOTE — Anesthesia Postprocedure Evaluation (Signed)
  Anesthesia Post-op Note  Patient: Tanya Carr  Procedure(s) Performed: Procedure(s) (LRB): ROBOTIC ASSISTED TOTAL HYSTERECTOMY (N/A) BILATERAL SALPINGECTOMY (Bilateral) ROBOTIC ASSISTED LAPAROSCOPIC LYSIS OF ADHESION (N/A)  Patient Location: [facility]  Anesthesia Type: General  Level of Consciousness: awake, alert  and oriented  Airway and Oxygen Therapy: Patient Spontanous Breathing  Post-op Pain: none  Post-op Assessment: Post-op Vital signs reviewed and Patient's Cardiovascular Status Stable  Post-op Vital Signs: Reviewed and stable  Complications: No apparent anesthesia complications

## 2011-11-25 MED ORDER — IBUPROFEN 600 MG PO TABS: 600.0000 mg | ORAL_TABLET | Freq: Four times a day (QID) | ORAL | Status: AC | PRN

## 2011-11-25 MED ORDER — OXYCODONE-ACETAMINOPHEN 5-325 MG PO TABS: 2.0000 | ORAL_TABLET | ORAL | Status: AC | PRN

## 2011-11-25 MED ORDER — ONDANSETRON HCL 4 MG PO TABS: 4.0000 mg | ORAL_TABLET | Freq: Four times a day (QID) | ORAL | Status: AC | PRN

## 2011-11-25 NOTE — Progress Notes (Signed)
POD 1 s/p TLH- robotic assisted w/ LOA  No flatus, no N/V, tol reg po. Ambulating/ voiding w/o difficulty. Pain controlled w/ po meds. No vaginal bleeding, NO concerns, OR findings reviewed w/ pt. Pt notes blurry vision, no other neuro sx   PE  Filed Vitals:   11/24/11 2100 11/25/11 0155 11/25/11 0542 11/25/11 0544  BP: 113/73 100/58 152/70   Pulse: 61 61 99   Temp: 98.4 F (36.9 C) 98.1 F (36.7 C) 97.7 F (36.5 C)   TempSrc: Oral Oral Oral   Resp: 18 18 18    Height:      Weight:      SpO2: 100%   97%   Gen: well appearing CV: RRR Pulm: CTAB HEENT: scopolamine patch on Abd: soft, ND, approp tended Inc: well healing, dermabond LE: NT, no edema GU: no staining on pad  CBC    Component Value Date/Time   WBC 14.4* 11/24/2011 1330   RBC 4.96 11/24/2011 1330   HGB 13.5 11/24/2011 1330   HCT 41.6 11/24/2011 1330   PLT 189 11/24/2011 1330   MCV 83.9 11/24/2011 1330   MCH 27.2 11/24/2011 1330   MCHC 32.5 11/24/2011 1330   RDW 13.6 11/24/2011 1330    A/P; POD #1 TLH - appropriate post-op course, d/c home today, instructions reviewed, f/u in office 2 wks, warning signs d/w  Pt - scopolamine patch as cause for vision change - percocet at home for pain  Patient ID: Tanya Carr MRN: [id number] DOB/AGE: 1971/01/11 40 y.o.  Admit date: 11/24/2011 Discharge date: 11/25/11  Admission Diagnoses: Pelvic Pain, Dysmenorrhear  Discharge Diagnoses: Pelvic Pain, Dysmenorrhear         Discharged Condition: good  Hospital Course: normal post-op course  Consults: None and MFM  Treatments: surgery: TLH and b/l salpingectomy  Disposition: home   Medication List  As of 11/25/2011  8:01 AM   ASK your doctor about these medications         lisinopril 30 MG tablet   Commonly known as: PRINIVIL,ZESTRIL   Take 30 mg by mouth every morning.             Signed: Lendon Colonel., MD MD 11/25/2011, 8:01 AM

## 2011-11-25 NOTE — Progress Notes (Signed)
9604 nursing. Pt. Is discharged in the care of friend.Downstairs per ambulatory. Discharge instructions were given to pt, with good comprendison. Questions were asked and answered. Denies any further pain or discomfort. Voiding with out any difficulty. Stable.

## 2011-11-26 NOTE — Discharge Summary (Signed)
Physician Discharge Summary  Patient ID: Tanya Carr MRN: [id number] DOB/AGE: [birth year] 41 y.o.  Admit date: 11/24/2011 Discharge date: 11/25/11  Admission Diagnoses: Pelvic Pain, Dysmenorrhear  Discharge Diagnoses: Pelvic Pain, Dysmenorrhear        Active Problems:  * No active hospital problems. *    Discharged Condition: good  Hospital Course: Pt had uncomplicated robotic assisted TLH with bilateral salpingectomy. Post-operatively she did well and by time of d/c was ambulating, voiding w/o difficulty and had pain controlled with po meds and minimal vaginal spotting.   Consults: None  Treatments: surgery: robotic assisted TLH, b/l salpingectomy  Disposition: 01-Home or Self Care   Medication List  As of 11/26/2011  3:09 PM   TAKE these medications         ibuprofen 600 MG tablet   Commonly known as: ADVIL,MOTRIN   Take 1 tablet (600 mg total) by mouth every 6 (six) hours as needed for pain (mild pain).      lisinopril 30 MG tablet   Commonly known as: PRINIVIL,ZESTRIL   Take 30 mg by mouth every morning.      ondansetron 4 MG tablet   Commonly known as: ZOFRAN   Take 1 tablet (4 mg total) by mouth every 6 (six) hours as needed for nausea.      oxyCODONE-acetaminophen 5-325 MG per tablet   Commonly known as: PERCOCET   Take 2 tablets by mouth every 3 (three) hours as needed (moderate to severe pain (when tolerating fluids)).             Signed: Lendon Colonel., MD 11/26/2011, 3:09 PM

## 2011-12-18 ENCOUNTER — Other Ambulatory Visit (HOSPITAL_COMMUNITY): Payer: Self-pay | Admitting: Family Medicine

## 2011-12-18 DIAGNOSIS — Z1231 Encounter for screening mammogram for malignant neoplasm of breast: Secondary | ICD-10-CM

## 2011-12-19 ENCOUNTER — Ambulatory Visit (HOSPITAL_COMMUNITY)
Admission: RE | Admit: 2011-12-19 | Discharge: 2011-12-19 | Disposition: A | Discharge status: Home / Self Care | Payer: No Typology Code available for payment source | Source: Ambulatory Visit | Attending: Family Medicine | Admitting: Family Medicine

## 2011-12-19 DIAGNOSIS — Z1231 Encounter for screening mammogram for malignant neoplasm of breast: Secondary | ICD-10-CM | POA: Insufficient documentation

## 2013-02-16 ENCOUNTER — Other Ambulatory Visit: Payer: Self-pay | Admitting: Family Medicine

## 2013-02-16 DIAGNOSIS — Z1231 Encounter for screening mammogram for malignant neoplasm of breast: Secondary | ICD-10-CM

## 2013-03-24 ENCOUNTER — Encounter: Payer: Self-pay | Admitting: Family Medicine

## 2013-03-24 ENCOUNTER — Ambulatory Visit (HOSPITAL_COMMUNITY)
Admission: RE | Admit: 2013-03-24 | Discharge: 2013-03-24 | Disposition: A | Discharge status: Home / Self Care | Payer: No Typology Code available for payment source | Source: Ambulatory Visit | Attending: Family Medicine | Admitting: Family Medicine

## 2013-03-24 ENCOUNTER — Ambulatory Visit (INDEPENDENT_AMBULATORY_CARE_PROVIDER_SITE_OTHER): Payer: No Typology Code available for payment source | Admitting: Family Medicine

## 2013-03-24 ENCOUNTER — Ambulatory Visit: Payer: No Typology Code available for payment source | Admitting: Family Medicine

## 2013-03-24 VITALS — BP 120/82 | HR 76 | Temp 97.9°F | Ht 64.0 in | Wt 211.0 lb

## 2013-03-24 DIAGNOSIS — F4310 Post-traumatic stress disorder, unspecified: Secondary | ICD-10-CM | POA: Insufficient documentation

## 2013-03-24 DIAGNOSIS — I10 Essential (primary) hypertension: Secondary | ICD-10-CM | POA: Insufficient documentation

## 2013-03-24 DIAGNOSIS — R600 Localized edema: Secondary | ICD-10-CM | POA: Insufficient documentation

## 2013-03-24 DIAGNOSIS — R609 Edema, unspecified: Secondary | ICD-10-CM

## 2013-03-24 DIAGNOSIS — Z1231 Encounter for screening mammogram for malignant neoplasm of breast: Secondary | ICD-10-CM | POA: Insufficient documentation

## 2013-03-24 DIAGNOSIS — F4321 Adjustment disorder with depressed mood: Secondary | ICD-10-CM | POA: Insufficient documentation

## 2013-03-24 DIAGNOSIS — R252 Cramp and spasm: Secondary | ICD-10-CM | POA: Insufficient documentation

## 2013-03-24 LAB — COMPREHENSIVE METABOLIC PANEL
ALT: 20 U/L (ref 0–35)
AST: 19 U/L (ref 0–37)
Alkaline Phosphatase: 45 U/L (ref 39–117)
BUN: 16 mg/dL (ref 6–23)
Creatinine, Ser: 0.8 mg/dL (ref 0.4–1.2)
Total Bilirubin: 0.4 mg/dL (ref 0.3–1.2)

## 2013-03-24 LAB — CBC WITH DIFFERENTIAL/PLATELET
Basophils Relative: 0.5 % (ref 0.0–3.0)
Eosinophils Relative: 1.8 % (ref 0.0–5.0)
HCT: 42.3 % (ref 36.0–46.0)
Hemoglobin: 14 g/dL (ref 12.0–15.0)
Lymphs Abs: 1.6 10*3/uL (ref 0.7–4.0)
MCV: 84.1 fl (ref 78.0–100.0)
Monocytes Absolute: 0.4 10*3/uL (ref 0.1–1.0)
Neutro Abs: 5.8 10*3/uL (ref 1.4–7.7)
Neutrophils Relative %: 73 % (ref 43.0–77.0)
RBC: 5.03 Mil/uL (ref 3.87–5.11)
WBC: 8 10*3/uL (ref 4.5–10.5)

## 2013-03-24 MED ORDER — LISINOPRIL 30 MG PO TABS: 30.0000 mg | ORAL_TABLET | Freq: Every morning | ORAL | Status: DC

## 2013-03-24 MED ORDER — AMLODIPINE BESYLATE 5 MG PO TABS: 5.0000 mg | ORAL_TABLET | Freq: Every day | ORAL | Status: DC

## 2013-03-24 NOTE — Progress Notes (Signed)
Subjective:    Patient ID: Tanya Carr, female    DOB: [birth year], 42 y.o.   MRN: [id number]  HPI  42 yo G0 here to establish care.  Grief- has had a lot of loss over past 3 years including her mother, father and grandmother.  Feels she is coping the best she can.  Does have days when she is tearful and has anhedonia.  Denies panic attacks. No SI or HI.  HTN- has been stable on lisinopril 30 mg daily and amlodipine 5 mg daily. Had a CPX in 09/2012.  Brings in labs from previous doctor- LDL 110, TG 55, HDL 45.  Has mammogram scheduled for this afternoon.  Does complain of some cramping in her legs at night and some bilateral LE edema after she has been on her feet.  Concerned because her brother died of DVT/PE.  Patient Active Problem List   Diagnosis Date Noted  . Leg cramps 03/24/2013  . Lower extremity edema 03/24/2013  . PTSD (post-traumatic stress disorder) 03/24/2013  . Grief 03/24/2013  . Hypertension    Past Medical History  Diagnosis Date  . PONV (postoperative nausea and vomiting)     vagal response per pt  . Headache(784.0)     history of migraines  . Fainting episodes 2011    history of with blood draw, mammogram, post op 2011  . Hypertension     on lisinopril-will do ekg today   Past Surgical History  Procedure Laterality Date  . Ulnar nerve decompression  2011  . Abdominal hysterectomy     History  Substance Use Topics  . Smoking status: Former Smoker    Quit date: 11/16/1996  . Smokeless tobacco: Not on file  . Alcohol Use: Yes     Comment: rare   Family History  Problem Relation Age of Onset  . Cancer Mother   . Multiple sclerosis Mother   . COPD Father   . Pulmonary embolism Brother    Allergies  Allergen Reactions  . Food Hives    Seafood allergy: hives and itching   No current outpatient prescriptions on file prior to visit.   No current facility-administered medications on file prior to visit.   The PMH, PSH, Social  History, Family History, Medications, and allergies have been reviewed in [facility], and have been updated if relevant.   Review of Systems   See HPI No CP No SOB No blood in stool Objective:   Physical Exam BP 120/82  Pulse 76  Temp(Src) 97.9 F (36.6 C)  Ht 5\' 4"  (1.626 m)  Wt 211 lb (95.709 kg)  BMI 36.2 kg/m2  LMP 11/10/2011  General:  Well-developed,well-nourished,in no acute distress; alert,appropriate and cooperative throughout examination Head:  normocephalic and atraumatic.   Lungs:  Normal respiratory effort, chest expands symmetrically. Lungs are clear to auscultation, no crackles or wheezes. Heart:  Normal rate and regular rhythm. S1 and S2 normal without gallop, murmur, click, rub or other extra sounds. Abdomen:  Bowel sounds positive,abdomen soft and non-tender without masses, organomegaly or hernias noted. Msk:  No deformity or scoliosis noted of thoracic or lumbar spine.   Extremities:  No clubbing, cyanosis, edema, or deformity noted with normal full range of motion of all joints.   Neurologic:  alert & oriented X3 and gait normal.   Skin:  Intact without suspicious lesions or rashes Cervical Nodes:  No lymphadenopathy noted Axillary Nodes:  No palpable lymphadenopathy Psych:  Cognition and judgment appear intact. Alert and cooperative with normal  attention span and concentration. No apparent delusions, illusions, hallucinations     Assessment & Plan:  1. Hypertension Stable on current rx.  Rx refilled. - Comprehensive metabolic panel  2. Leg cramps Check electrolytes today.  3. Lower extremity edema Likely dependent edema.  Will check labs as well today. - TSH - CBC with Differential - T4, Free  4. PTSD (post-traumatic stress disorder) Discussed tx options.  Refusing medications and psychotherapy.  Has good support system.  5. Grief

## 2013-03-24 NOTE — Patient Instructions (Addendum)
Nice to meet you. I will call you with your lab results.  At your convenience, you can make an appointment for a physical.

## 2013-03-25 ENCOUNTER — Encounter: Payer: Self-pay | Admitting: *Deleted

## 2013-03-28 ENCOUNTER — Other Ambulatory Visit: Payer: Self-pay | Admitting: Family Medicine

## 2013-03-28 DIAGNOSIS — R928 Other abnormal and inconclusive findings on diagnostic imaging of breast: Secondary | ICD-10-CM

## 2013-03-29 ENCOUNTER — Telehealth: Payer: Self-pay

## 2013-03-29 NOTE — Telephone Encounter (Signed)
Noted. Thank you for the update.

## 2013-03-29 NOTE — Telephone Encounter (Signed)
Pt had mammogram done 03/24/13 and pt was called back for additional views on right breast to be done 04/12/13 at [facility] in [place]. Wanted Dr Dayton Martes to be aware. Pt requested lab results also ; advised would receive copy in mail and pt notified as instructed by phone.

## 2013-04-12 ENCOUNTER — Ambulatory Visit
Admission: RE | Admit: 2013-04-12 | Discharge: 2013-04-12 | Disposition: A | Discharge status: Home / Self Care | Payer: No Typology Code available for payment source | Source: Ambulatory Visit | Attending: Family Medicine | Admitting: Family Medicine

## 2013-04-12 DIAGNOSIS — R928 Other abnormal and inconclusive findings on diagnostic imaging of breast: Secondary | ICD-10-CM

## 2013-05-03 ENCOUNTER — Encounter: Payer: Self-pay | Admitting: Family Medicine

## 2013-07-07 ENCOUNTER — Other Ambulatory Visit: Payer: Self-pay

## 2013-10-14 ENCOUNTER — Ambulatory Visit (INDEPENDENT_AMBULATORY_CARE_PROVIDER_SITE_OTHER): Payer: No Typology Code available for payment source | Admitting: Internal Medicine

## 2013-10-14 ENCOUNTER — Encounter: Payer: Self-pay | Admitting: Internal Medicine

## 2013-10-14 VITALS — BP 120/82 | HR 70 | Temp 97.7°F | Ht 64.0 in | Wt 207.0 lb

## 2013-10-14 DIAGNOSIS — Z0000 Encounter for general adult medical examination without abnormal findings: Secondary | ICD-10-CM

## 2013-10-14 DIAGNOSIS — I10 Essential (primary) hypertension: Secondary | ICD-10-CM

## 2013-10-14 DIAGNOSIS — E669 Obesity, unspecified: Secondary | ICD-10-CM | POA: Insufficient documentation

## 2013-10-14 LAB — CBC
HEMATOCRIT: 41.7 % (ref 36.0–46.0)
Hemoglobin: 13.3 g/dL (ref 12.0–15.0)
MCHC: 32 g/dL (ref 30.0–36.0)
MCV: 85 fl (ref 78.0–100.0)
Platelets: 251 10*3/uL (ref 150.0–400.0)
RBC: 4.9 Mil/uL (ref 3.87–5.11)
RDW: 14 % (ref 11.5–14.6)
WBC: 7.6 10*3/uL (ref 4.5–10.5)

## 2013-10-14 LAB — COMPREHENSIVE METABOLIC PANEL
ALK PHOS: 40 U/L (ref 39–117)
ALT: 17 U/L (ref 0–35)
AST: 16 U/L (ref 0–37)
Albumin: 4 g/dL (ref 3.5–5.2)
BILIRUBIN TOTAL: 0.6 mg/dL (ref 0.3–1.2)
BUN: 16 mg/dL (ref 6–23)
CO2: 26 mEq/L (ref 19–32)
CREATININE: 0.8 mg/dL (ref 0.4–1.2)
Calcium: 9.1 mg/dL (ref 8.4–10.5)
Chloride: 105 mEq/L (ref 96–112)
GFR: 81.51 mL/min (ref >60.00)
GLUCOSE: 88 mg/dL (ref 70–99)
Potassium: 3.9 mEq/L (ref 3.5–5.1)
SODIUM: 138 meq/L (ref 135–145)
TOTAL PROTEIN: 7.1 g/dL (ref 6.0–8.3)

## 2013-10-14 LAB — LIPID PANEL
Cholesterol: 157 mg/dL (ref 0–200)
HDL: 43.1 mg/dL (ref >39.00)
LDL Cholesterol: 97 mg/dL (ref 0–99)
TRIGLYCERIDES: 83 mg/dL (ref 0.0–149.0)
Total CHOL/HDL Ratio: 4
VLDL: 16.6 mg/dL (ref 0.0–40.0)

## 2013-10-14 LAB — TSH: TSH: 2.36 u[IU]/mL (ref 0.35–5.50)

## 2013-10-14 LAB — HEMOGLOBIN A1C: HEMOGLOBIN A1C: 5.9 % (ref 4.6–6.5)

## 2013-10-14 NOTE — Patient Instructions (Addendum)

## 2013-10-14 NOTE — Assessment & Plan Note (Signed)
Encouraged her to work on diet and exercise 

## 2013-10-14 NOTE — Progress Notes (Signed)
Subjective:    Patient ID: Tanya Carr, female    DOB: 1970-10-16, 43 y.o.   MRN: [id number]  HPI  Pt presents to the clinic today for her annual exam. She does have some concerns today about a rash under her breast. She has been using a RX cream (triamcinolone) prescribed by another MD. She doesn't like to use the cream because it burns so bad.  Flu: never Tetanus: 09/2012 LMP: Hysterectomy Pap Smear: 2012 Mammogram: 03/2013 Eye Doctor: as needed Dentist: biannually  Review of Systems      Past Medical History  Diagnosis Date  . PONV (postoperative nausea and vomiting)     vagal response per pt  . Headache(784.0)     history of migraines  . Fainting episodes 2011    history of with blood draw, mammogram, post op 2011  . Hypertension     on lisinopril-will do ekg today    Current Outpatient Prescriptions  Medication Sig Dispense Refill  . amLODipine (NORVASC) 5 MG tablet Take 1 tablet (5 mg total) by mouth daily.  30 tablet  11  . lisinopril (PRINIVIL,ZESTRIL) 30 MG tablet Take 1 tablet (30 mg total) by mouth every morning.  30 tablet  11   No current facility-administered medications for this visit.    Allergies  Allergen Reactions  . Food Hives    Seafood allergy: hives and itching    Family History  Problem Relation Age of Onset  . Cancer Mother   . Multiple sclerosis Mother   . COPD Father   . Pulmonary embolism Brother     History   Social History  . Marital Status: Single    Spouse Name: N/A    Number of Children: N/A  . Years of Education: N/A   Occupational History  . Not on file.   Social History Main Topics  . Smoking status: Former Smoker    Quit date: 11/16/1996  . Smokeless tobacco: Not on file  . Alcohol Use: Yes     Comment: rare  . Drug Use: No  . Sexual Activity: Not on file   Other Topics Concern  . Not on file   Social History Narrative   G0   Travel agent     Constitutional: Denies fever, malaise, fatigue,  headache or abrupt weight changes.  HEENT: Denies eye pain, eye redness, ear pain, ringing in the ears, wax buildup, runny nose, nasal congestion, bloody nose, or sore throat. Respiratory: Denies difficulty breathing, shortness of breath, cough or sputum production.   Cardiovascular: Denies chest pain, chest tightness, palpitations or swelling in the hands or feet.  Gastrointestinal: Denies abdominal pain, bloating, constipation, diarrhea or blood in the stool.  GU: Denies urgency, frequency, pain with urination, burning sensation, blood in urine, odor or discharge. Musculoskeletal: Pt reports bilateral knee pain. Denies decrease in range of motion, difficulty with gait, muscle pain or joint swelling.  Skin: Pt reports rash around bilateral nipples. Denies redness, lesions or ulcercations.  Neurological: Denies dizziness, difficulty with memory, difficulty with speech or problems with balance and coordination.   No other specific complaints in a complete review of systems (except as listed in HPI above).  Objective:   Physical Exam   BP 120/82  Pulse 70  Temp(Src) 97.7 F (36.5 C) (Oral)  Ht 5\' 4"  (1.626 m)  Wt 207 lb (93.895 kg)  BMI 35.51 kg/m2  SpO2 98%  LMP 11/10/2011 Wt Readings from Last 3 Encounters:  10/14/13 207 lb (93.895 kg)  03/24/13 211 lb (95.709 kg)  11/25/11 190 lb (86.183 kg)    General: Appears her stated age, obese but well developed, well nourished in NAD. Skin: Warm, dry and intact. No lesions or ulcerations noted. Exzema noted on bilateral areolas. HEENT: Head: normal shape and size; Eyes: sclera white, no icterus, conjunctiva pink, PERRLA and EOMs intact; Ears: Tm's gray and intact, normal light reflex; Nose: mucosa pink and moist, septum midline; Throat/Mouth: Teeth present, mucosa pink and moist, no exudate, lesions or ulcerations noted.  Neck: Normal range of motion. Neck supple, trachea midline. No massses, lumps or thyromegaly present.  Cardiovascular:  Normal rate and rhythm. S1,S2 noted.  No murmur, rubs or gallops noted. No JVD or BLE edema. No carotid bruits noted. Pulmonary/Chest: Normal effort and positive vesicular breath sounds. No respiratory distress. No wheezes, rales or ronchi noted.  Abdomen: Soft and nontender. Normal bowel sounds, no bruits noted. No distention or masses noted. Liver, spleen and kidneys non palpable. Musculoskeletal: Normal range of motion. No signs of joint swelling. No difficulty with gait.  Neurological: Alert and oriented. Cranial nerves II-XII intact. Coordination normal. +DTRs bilaterally. Psychiatric: Mood and affect normal. Behavior is normal. Judgment and thought content normal.     BMET    Component Value Date/Time   NA 137 03/24/2013 1343   K 3.7 03/24/2013 1343   CL 102 03/24/2013 1343   CO2 28 03/24/2013 1343   GLUCOSE 84 03/24/2013 1343   BUN 16 03/24/2013 1343   CREATININE 0.8 03/24/2013 1343   CALCIUM 9.1 03/24/2013 1343   GFRNONAA >90 11/17/2011 0846   GFRAA >90 11/17/2011 0846     CBC    Component Value Date/Time   WBC 8.0 03/24/2013 1343   RBC 5.03 03/24/2013 1343   HGB 14.0 03/24/2013 1343   HCT 42.3 03/24/2013 1343   PLT 268.0 03/24/2013 1343   MCV 84.1 03/24/2013 1343   MCH 27.2 11/24/2011 1330   MCHC 33.1 03/24/2013 1343   RDW 14.2 03/24/2013 1343   LYMPHSABS 1.6 03/24/2013 1343   MONOABS 0.4 03/24/2013 1343   EOSABS 0.1 03/24/2013 1343   BASOSABS 0.0 03/24/2013 1343          Assessment & Plan:   Preventative Health Maintenance:  Encouraged her to work on diet and exercise- she is currently do workout videos at home She declines flu shot today Will obtain screening labs today  RTC in 1 year or sooner if needed

## 2013-10-14 NOTE — Progress Notes (Signed)
Pre-visit discussion using our clinic review tool. No additional management support is needed unless otherwise documented below in the visit note.  

## 2013-10-14 NOTE — Assessment & Plan Note (Signed)
Well controlled Continue current therapy for now

## 2013-10-17 ENCOUNTER — Telehealth: Payer: Self-pay | Admitting: Family Medicine

## 2013-10-17 NOTE — Telephone Encounter (Signed)
Relevant patient education assigned to patient using Emmi. ° °

## 2014-01-19 ENCOUNTER — Ambulatory Visit (INDEPENDENT_AMBULATORY_CARE_PROVIDER_SITE_OTHER): Payer: BC Managed Care – PPO | Admitting: Family Medicine

## 2014-01-19 ENCOUNTER — Ambulatory Visit (INDEPENDENT_AMBULATORY_CARE_PROVIDER_SITE_OTHER)
Admission: RE | Admit: 2014-01-19 | Discharge: 2014-01-19 | Disposition: A | Discharge status: Home / Self Care | Payer: BC Managed Care – PPO | Source: Ambulatory Visit | Attending: Family Medicine | Admitting: Family Medicine

## 2014-01-19 ENCOUNTER — Encounter: Payer: Self-pay | Admitting: Family Medicine

## 2014-01-19 VITALS — BP 122/80 | HR 73 | Temp 98.0°F | Ht 63.75 in | Wt 208.5 lb

## 2014-01-19 DIAGNOSIS — M25519 Pain in unspecified shoulder: Secondary | ICD-10-CM

## 2014-01-19 DIAGNOSIS — M25511 Pain in right shoulder: Secondary | ICD-10-CM | POA: Insufficient documentation

## 2014-01-19 NOTE — Patient Instructions (Signed)
You have rotator cuff impingement/tendonitis.  Try to avoid painful activities (overhead activities, lifting with extended arm) as much as possible.  Aleve 2 tabs twice a day with food OR ibuprofen 3 tabs three times a day with food for pain and inflammation.  Can take tylenol in addition to this.  Subacromial (cortisone) injection may be beneficial to help with pain and to decrease inflammation.  Start physical therapy with transition to home exercise program.  If not improving at follow-up we will consider further imaging, and/or nitro patches.

## 2014-01-19 NOTE — Progress Notes (Signed)
Pre visit review using our clinic review tool, if applicable. No additional management support is needed unless otherwise documented below in the visit note. 

## 2014-01-19 NOTE — Progress Notes (Signed)
SUBJECTIVE: Tanya Carr is a 43 y.o. female who complains of  right shoulder injury pain for 3-4 months.  No known injury:  Pain sometimes wakes her up at night, worse when she snaps her bra or lifts arm overhead.   Prior history of related problems: no prior problems with this area in the past.  Patient Active Problem List   Diagnosis Date Noted  . Right shoulder pain 01/19/2014  . Obesity (BMI 30-39.9) 10/14/2013  . Grief 03/24/2013  . Hypertension    Past Medical History  Diagnosis Date  . PONV (postoperative nausea and vomiting)     vagal response per pt  . Headache(784.0)     history of migraines  . Fainting episodes 2011    history of with blood draw, mammogram, post op 2011  . Hypertension     on lisinopril-will do ekg today   Past Surgical History  Procedure Laterality Date  . Ulnar nerve decompression  2011  . Abdominal hysterectomy     History  Substance Use Topics  . Smoking status: Former Smoker    Quit date: 11/16/1996  . Smokeless tobacco: Not on file  . Alcohol Use: Yes     Comment: rare   Family History  Problem Relation Age of Onset  . Cancer Mother   . Multiple sclerosis Mother   . COPD Father   . Pulmonary embolism Brother    Allergies  Allergen Reactions  . Food Hives    Seafood allergy: hives and itching   Current Outpatient Prescriptions on File Prior to Visit  Medication Sig Dispense Refill  . amLODipine (NORVASC) 5 MG tablet Take 1 tablet (5 mg total) by mouth daily.  30 tablet  11  . lisinopril (PRINIVIL,ZESTRIL) 30 MG tablet Take 1 tablet (30 mg total) by mouth every morning.  30 tablet  11   No current facility-administered medications on file prior to visit.   The PMH, PSH, Social History, Family History, Medications, and allergies have been reviewed in [facility], and have been updated if relevant.    OBJECTIVE: BP 122/80  Pulse 73  Temp(Src) 98 F (36.7 C) (Oral)  Ht 5' 3.75" (1.619 m)  Wt 208 lb 8 oz (94.575 kg)  BMI  36.08 kg/m2  SpO2 97%  LMP 11/10/2011  Vital signs as noted above. Appearance: alert, well appearing, and in no distress. Shoulder exam: normal exam, no swelling, tenderness, instability; ligaments intact, FROM shoulder joint, positive impingement signs. X-ray: ordered, but results not yet available.  ASSESSMENT: Shoulder impingement (rotator cuff)  PLAN: rest the injured area as much as practical, X-Ray ordered, PT. See orders for this visit as documented in the electronic medical record.

## 2014-03-13 ENCOUNTER — Other Ambulatory Visit: Payer: Self-pay

## 2014-03-13 DIAGNOSIS — Z1231 Encounter for screening mammogram for malignant neoplasm of breast: Secondary | ICD-10-CM

## 2014-03-22 ENCOUNTER — Other Ambulatory Visit: Payer: Self-pay | Admitting: Family Medicine

## 2014-04-24 ENCOUNTER — Ambulatory Visit
Admission: RE | Admit: 2014-04-24 | Discharge: 2014-04-24 | Disposition: A | Discharge status: Home / Self Care | Payer: BC Managed Care – PPO | Source: Ambulatory Visit

## 2014-04-24 DIAGNOSIS — Z1231 Encounter for screening mammogram for malignant neoplasm of breast: Secondary | ICD-10-CM

## 2014-05-10 ENCOUNTER — Ambulatory Visit
Admission: RE | Admit: 2014-05-10 | Discharge: 2014-05-10 | Disposition: A | Discharge status: Home / Self Care | Payer: BC Managed Care – PPO | Source: Ambulatory Visit | Attending: Family Medicine | Admitting: Family Medicine

## 2014-05-10 ENCOUNTER — Encounter: Payer: Self-pay | Admitting: Family Medicine

## 2014-05-10 ENCOUNTER — Ambulatory Visit (INDEPENDENT_AMBULATORY_CARE_PROVIDER_SITE_OTHER): Payer: BC Managed Care – PPO | Admitting: Family Medicine

## 2014-05-10 VITALS — BP 120/82 | HR 77 | Temp 98.2°F | Ht 63.75 in | Wt 209.8 lb

## 2014-05-10 DIAGNOSIS — S40022A Contusion of left upper arm, initial encounter: Secondary | ICD-10-CM

## 2014-05-10 DIAGNOSIS — S40029A Contusion of unspecified upper arm, initial encounter: Secondary | ICD-10-CM

## 2014-05-10 DIAGNOSIS — M79632 Pain in left forearm: Secondary | ICD-10-CM

## 2014-05-10 DIAGNOSIS — M79609 Pain in unspecified limb: Secondary | ICD-10-CM

## 2014-05-10 NOTE — Progress Notes (Signed)
Dr. Frederico Hamman T. Toshiye Kever, MD, [facility] [place], [address] Phone: [phone] Fax: [phone]  05/10/2014  Patient: Tanya Carr, MRN: [id number], DOB: [birth year], 43 y.o.  Primary Physician:  Arnette Norris, MD  Chief Complaint: Arm Injury  Subjective:   Tanya Carr is a 43 y.o. very pleasant female patient who presents with the following:  Left forearm, fell on Sunday. Tried to swing up from the hot sand. He patient [place]. At that time she traumatized and she has continued to have significant pain in the midshaft of herforearm and volarly. It is on both sides radially and ulnarly. She is able to move at the elbow and the shoulder freely. No prior operative interventions or fractures in the affected arm.  Past Medical History, Surgical History, Social History, Family History, Problem List, Medications, and Allergies have been reviewed and updated if relevant.  GEN: No fevers, chills. Nontoxic. Primarily MSK c/o today. MSK: Detailed in the HPI GI: tolerating PO intake without difficulty Neuro: No numbness, parasthesias, or tingling associated. Otherwise the pertinent positives of the ROS are noted above.   Objective:   BP 120/82  Pulse 77  Temp(Src) 98.2 F (36.8 C) (Oral)  Ht 5' 3.75" (1.619 m)  Wt 209 lb 12 oz (95.142 kg)  BMI 36.30 kg/m2  LMP 11/10/2011   GEN: WDWN, NAD, Non-toxic, Alert & Oriented x 3 HEENT: Atraumatic, Normocephalic.  Ears and Nose: No external deformity. EXTR: No clubbing/cyanosis/edema NEURO: Normal gait.  PSYCH: Normally interactive. Conversant. Not depressed or anxious appearing.  Calm demeanor.   Shoulder: L Inspection: No muscle wasting or winging Ecchymosis/edema: neg  AC joint, scapula, clavicle: NT Cervical spine: NT, full ROM Spurling's: neg Abduction: full, 5/5 Flexion: full, 5/5 IR, full, lift-off: 5/5 ER at neutral: full,  5/5  Full rom at elbow, all directions  Hand: L Ecchymosis or edema: VOLAR edema and bruising approx 2-3 cm prox to wrist extending 7 cm and tender to palpation ROM wrist/hand/digits/elbow: full  Carpals, MCP's, digits: NT Distal Ulna and Radius: NT Supination lift test: neg Ecchymosis or edema: neg Cysts/nodules: neg Finkelstein's test: neg Snuffbox tenderness: neg Scaphoid tubercle: NT Hook of Hamate: NT Resisted supination: NT Full composite fist Grip, all digits: 5/5 str No tenosynovitis Axial load test: neg Phalen's: neg Tinel's: neg Atrophy: neg  Hand sensation: intact   Radiology: Dg Forearm Left  05/10/2014   CLINICAL DATA:  Contusion to anterior distal third of left forearm, injured while jumping  EXAM: LEFT FOREARM - 2 VIEW  COMPARISON:  None.  FINDINGS: There is no evidence of fracture or other focal bone lesions. Soft tissues are unremarkable.  IMPRESSION: Negative.   Electronically Signed   By: Skipper Cliche M.D.   On: 05/10/2014 14:40   Assessment and Plan:   Left forearm pain - Plan: DG Forearm Left  Hematoma of arm, left, initial encounter  C/w hematoma without fracture. Anticipate at least 2 weeks until resolution. Advil, alleve, or tylenol prn.  Follow-up: No Follow-up on file.  New Prescriptions   No medications on file   Orders Placed This Encounter  Procedures  . DG Forearm Left    Signed,  Kelcy Laible T. Marleny Faller, MD   Patient's Medications  New Prescriptions   No medications on file  Previous Medications   AMLODIPINE (NORVASC) 5 MG TABLET    TAKE 1 TABLET BY MOUTH DAILY.   LISINOPRIL (PRINIVIL,ZESTRIL)  30 MG TABLET    TAKE 1 TABLET BY MOUTH EVERY MORNING.  Modified Medications   No medications on file  Discontinued Medications   No medications on file

## 2014-05-10 NOTE — Progress Notes (Signed)
Pre visit review using our clinic review tool, if applicable. No additional management support is needed unless otherwise documented below in the visit note. 

## 2014-07-21 ENCOUNTER — Other Ambulatory Visit: Payer: Self-pay | Admitting: Family Medicine

## 2014-08-07 ENCOUNTER — Encounter: Payer: Self-pay | Admitting: Family Medicine

## 2014-08-07 ENCOUNTER — Ambulatory Visit (INDEPENDENT_AMBULATORY_CARE_PROVIDER_SITE_OTHER): Payer: BC Managed Care – PPO | Admitting: Family Medicine

## 2014-08-07 VITALS — BP 120/78 | HR 72 | Temp 98.1°F | Wt 213.0 lb

## 2014-08-07 DIAGNOSIS — I10 Essential (primary) hypertension: Secondary | ICD-10-CM

## 2014-08-07 DIAGNOSIS — F4321 Adjustment disorder with depressed mood: Secondary | ICD-10-CM

## 2014-08-07 DIAGNOSIS — E669 Obesity, unspecified: Secondary | ICD-10-CM

## 2014-08-07 MED ORDER — LORCASERIN HCL 10 MG PO TABS: ORAL_TABLET | ORAL | Status: DC

## 2014-08-07 NOTE — Assessment & Plan Note (Signed)
Well controlled on current rx. No changes made but she is due for lab work. Orders entered- she would like to have them done at a [facility] ancillary facility.

## 2014-08-07 NOTE — Assessment & Plan Note (Signed)
Deteriorated. Discussed tx options. Explained that phentermine containing weight loss rx not appropriate for her since she is being treated for HTN. Also, she is prone to palpitations. She would like to try Belviq- rx given and discussed possible side effects/risk.s

## 2014-08-07 NOTE — Assessment & Plan Note (Signed)
Deteriorated but remains appropriate. No red flag symptoms. Offered my support.

## 2014-08-07 NOTE — Patient Instructions (Signed)
Great to see you. Please go to any [facility] for your labs.  We are starting Belviq- please call me with update.

## 2014-08-07 NOTE — Progress Notes (Signed)
Subjective:    Patient ID: Tanya Carr, female    DOB: 04-10-1971, 43 y.o.   MRN: [id number]  HPI  43 yo pleasant female here for follow up.  HTN- has been stable on lisinopril 30 mg daily and amlodipine 5 mg daily. Lab Results  Component Value Date   CREATININE 0.8 10/14/2013   Lab Results  Component Value Date   NA 138 10/14/2013   K 3.9 10/14/2013   CL 105 10/14/2013   CO2 26 10/14/2013   Obesity- she is asking for weight loss rx her husband has- phentermine-topamax combination.  Feels she needs something to suppress her appetite.  Now that she works from home, she eats when she bored. She also stress eats.  Now that all of her family has passed away- the holidays are a sad, stressful time. Wt Readings from Last 3 Encounters:  08/07/14 213 lb (96.616 kg)  05/10/14 209 lb 12 oz (95.142 kg)  01/19/14 208 lb 8 oz (94.575 kg)     Patient Active Problem List   Diagnosis Date Noted  . Right shoulder pain 01/19/2014  . Obesity (BMI 30-39.9) 10/14/2013  . Grief 03/24/2013  . Hypertension    Past Medical History  Diagnosis Date  . PONV (postoperative nausea and vomiting)     vagal response per pt  . Headache(784.0)     history of migraines  . Fainting episodes 2011    history of with blood draw, mammogram, post op 2011  . Hypertension     on lisinopril-will do ekg today   Past Surgical History  Procedure Laterality Date  . Ulnar nerve decompression  2011  . Abdominal hysterectomy     History  Substance Use Topics  . Smoking status: Former Smoker    Quit date: 11/16/1996  . Smokeless tobacco: Never Used  . Alcohol Use: Yes     Comment: rare   Family History  Problem Relation Age of Onset  . Cancer Mother   . Multiple sclerosis Mother   . COPD Father   . Pulmonary embolism Brother    Allergies  Allergen Reactions  . Food Hives    Seafood allergy: hives and itching   Current Outpatient Prescriptions on File Prior to Visit  Medication Sig  Dispense Refill  . amLODipine (NORVASC) 5 MG tablet TAKE 1 TABLET BY MOUTH DAILY. 30 tablet 0  . lisinopril (PRINIVIL,ZESTRIL) 30 MG tablet TAKE 1 TABLET BY MOUTH EVERY MORNING. 30 tablet 0   No current facility-administered medications on file prior to visit.   The PMH, PSH, Social History, Family History, Medications, and allergies have been reviewed in [facility], and have been updated if relevant.   Review of Systems   See HPI No CP No SOB No blood in stool +sadness but denies feeling depressed No SI or HI No HA No blurred vision Objective:   Physical Exam BP 120/78 mmHg  Pulse 72  Temp(Src) 98.1 F (36.7 C) (Oral)  Wt 213 lb (96.616 kg)  SpO2 97%  LMP 11/10/2011  General:  Well-developed,well-nourished,in no acute distress; alert,appropriate and cooperative throughout examination Head:  normocephalic and atraumatic.   Lungs:  Normal respiratory effort, chest expands symmetrically. Lungs are clear to auscultation, no crackles or wheezes. Heart:  Normal rate and regular rhythm. S1 and S2 normal without gallop, murmur, click, rub or other extra sounds. Abdomen:  Bowel sounds positive,abdomen soft and non-tender without masses, organomegaly or hernias noted. Msk:  No deformity or scoliosis noted of thoracic or lumbar spine.  Extremities:  No clubbing, cyanosis, edema, or deformity noted with normal full range of motion of all joints.   Neurologic:  alert & oriented X3 and gait normal.   Skin:  Intact without suspicious lesions or rashes Psych:  Cognition and judgment appear intact. Alert and cooperative with normal attention span and concentration. No apparent delusions, illusions, hallucinations     Assessment & Plan:

## 2014-08-07 NOTE — Progress Notes (Signed)
Pre visit review using our clinic review tool, if applicable. No additional management support is needed unless otherwise documented below in the visit note. 

## 2014-08-17 ENCOUNTER — Other Ambulatory Visit: Payer: Self-pay | Admitting: Family Medicine

## 2014-09-06 ENCOUNTER — Other Ambulatory Visit: Payer: Self-pay | Admitting: Family Medicine

## 2014-09-06 NOTE — Telephone Encounter (Signed)
Pt requesting medication refill. Last f/u appt 08/2014. pls advise

## 2014-09-06 NOTE — Telephone Encounter (Signed)
Rx called in to requested pharmacy 

## 2014-10-13 ENCOUNTER — Other Ambulatory Visit: Payer: Self-pay | Admitting: Family Medicine

## 2014-10-13 NOTE — Telephone Encounter (Signed)
Spoke to pt and advised that an appt is required q39mos. Pt states that she unable to schedule a f/u appt

## 2014-10-13 NOTE — Telephone Encounter (Signed)
Pt left v/m requesting why Belviq refill was denied. Called pt and advised that she does need to be seen at least every 2 months. Pt said she was unaware of that and wanted cb with more details of why she has to be seen q 14months.

## 2014-10-24 ENCOUNTER — Other Ambulatory Visit (INDEPENDENT_AMBULATORY_CARE_PROVIDER_SITE_OTHER): Payer: BLUE CROSS/BLUE SHIELD

## 2014-10-24 DIAGNOSIS — I10 Essential (primary) hypertension: Secondary | ICD-10-CM

## 2014-10-25 LAB — COMPREHENSIVE METABOLIC PANEL
A/G RATIO: 1.4 (ref 1.1–2.5)
ALBUMIN: 4.4 g/dL (ref 3.5–5.5)
ALK PHOS: 49 IU/L (ref 39–117)
ALT: 16 IU/L (ref 0–32)
AST: 23 IU/L (ref 0–40)
BILIRUBIN TOTAL: 0.3 mg/dL (ref 0.0–1.2)
BUN/Creatinine Ratio: 15 (ref 9–23)
BUN: 14 mg/dL (ref 6–24)
CHLORIDE: 101 mmol/L (ref 97–108)
CO2: 21 mmol/L (ref 18–29)
Calcium: 9.2 mg/dL (ref 8.7–10.2)
Creatinine, Ser: 0.93 mg/dL (ref 0.57–1.00)
GFR calc Af Amer: 86 mL/min/{1.73_m2} (ref >59)
GFR calc non Af Amer: 74 mL/min/{1.73_m2} (ref >59)
GLUCOSE: 111 mg/dL — AB (ref 65–99)
Globulin, Total: 3.1 g/dL (ref 1.5–4.5)
Potassium: 4.2 mmol/L (ref 3.5–5.2)
Sodium: 140 mmol/L (ref 134–144)
TOTAL PROTEIN: 7.5 g/dL (ref 6.0–8.5)

## 2014-11-20 ENCOUNTER — Other Ambulatory Visit: Payer: Self-pay | Admitting: Family Medicine

## 2015-01-06 ENCOUNTER — Encounter: Payer: Self-pay | Admitting: Family Medicine

## 2015-01-06 ENCOUNTER — Ambulatory Visit (INDEPENDENT_AMBULATORY_CARE_PROVIDER_SITE_OTHER): Payer: BLUE CROSS/BLUE SHIELD | Admitting: Family Medicine

## 2015-01-06 VITALS — BP 120/80 | HR 66 | Temp 97.7°F | Resp 20 | Ht 63.75 in | Wt 203.0 lb

## 2015-01-06 DIAGNOSIS — M545 Low back pain: Secondary | ICD-10-CM

## 2015-01-06 DIAGNOSIS — R109 Unspecified abdominal pain: Secondary | ICD-10-CM | POA: Diagnosis not present

## 2015-01-06 DIAGNOSIS — M5459 Other low back pain: Secondary | ICD-10-CM

## 2015-01-06 LAB — POCT URINALYSIS DIPSTICK
BILIRUBIN UA: NEGATIVE
Blood, UA: NEGATIVE
Glucose, UA: NEGATIVE
LEUKOCYTES UA: NEGATIVE
NITRITE UA: NEGATIVE
PROTEIN UA: NEGATIVE
Spec Grav, UA: <=1.005
Urobilinogen, UA: 0.2
pH, UA: 7.5

## 2015-01-06 NOTE — Patient Instructions (Signed)
Continue ibuprofen 600-800 mg two to three times per day with food as needed.

## 2015-01-06 NOTE — Progress Notes (Signed)
Pre visit review using our clinic review tool, if applicable. No additional management support is needed unless otherwise documented below in the visit note. 

## 2015-01-06 NOTE — Progress Notes (Signed)
OFFICE NOTE  01/06/2015  CC:  Chief Complaint  Patient presents with  . Flank Pain    Right, since Wed   HPI: Patient is a 44 y.o. Caucasian female who is here for right flank pain. Onset about 3d/a, pain in RLB/R flank region--constant ache, difficult to find comfortable position in bed.  No radiation of the pain.  No tingling or burning sensation, no rash in the area. Took 800 mg ibuprofen x 2 doses and this has helped.  No change in her usual urinary frequency, no urgency, no dysuria.  Urine appears normal.  No hx of UTIs.  No fevers.  No hx of injury/strain recalled by pt.  ROS: 1 wk diarrhea illness preceding these sx's but these sx's resolved now.  Pertinent PMH:  PMH and PSH reviewed  MEDS:  Outpatient Prescriptions Prior to Visit  Medication Sig Dispense Refill  . amLODipine (NORVASC) 5 MG tablet TAKE 1 TABLET (5 MG TOTAL) BY MOUTH DAILY. 30 tablet 8  . lisinopril (PRINIVIL,ZESTRIL) 30 MG tablet TAKE 1 TABLET (30 MG TOTAL) BY MOUTH DAILY. 30 tablet 8  . BELVIQ 10 MG TABS TAKE 1 TABLET BY MOUTH 2 TIMES A DAY. (Patient not taking: Reported on 01/06/2015) 60 tablet 0   No facility-administered medications prior to visit.    PE: Blood pressure 120/80, pulse 66, temperature 97.7 F (36.5 C), temperature source Oral, resp. rate 20, height 5' 3.75" (1.619 m), weight 203 lb (92.08 kg), last menstrual period 11/10/2011, SpO2 98 %. Gen: Alert, well appearing.  Patient is oriented to person, place, time, and situation. OMV:[facility]MC: no injection, icteris, swelling, or exudate.  EOMI, PERRLA. Mouth: lips without lesion/swelling.  Oral mucosa pink and moist. Oropharynx without erythema, exudate, or swelling.  CV: RRR, no m/r/g.   LUNGS: CTA bilat, nonlabored resps, good aeration in all lung fields. BACK and flanks: NO tenderness to palpation. ROM of back fully intact; mild pulling type discomfort in RLB with L lateral bending of L spine and with rotating L spine to the L.  LAB: CC UA today  normal.  IMPRESSION AND PLAN:  Acute musculoskeletal LBP. Reassured pt, encouraged stretching, heat, continue ibuprofen, watchful waiting approach.  An After Visit Summary was printed and given to the patient.  FOLLOW UP: prn

## 2015-02-14 ENCOUNTER — Ambulatory Visit (INDEPENDENT_AMBULATORY_CARE_PROVIDER_SITE_OTHER): Payer: BLUE CROSS/BLUE SHIELD | Admitting: Family Medicine

## 2015-02-14 ENCOUNTER — Encounter: Payer: Self-pay | Admitting: Family Medicine

## 2015-02-14 VITALS — BP 120/82 | HR 67 | Temp 98.1°F | Wt 205.8 lb

## 2015-02-14 DIAGNOSIS — F4321 Adjustment disorder with depressed mood: Secondary | ICD-10-CM | POA: Diagnosis not present

## 2015-02-14 DIAGNOSIS — E669 Obesity, unspecified: Secondary | ICD-10-CM

## 2015-02-14 DIAGNOSIS — I10 Essential (primary) hypertension: Secondary | ICD-10-CM | POA: Diagnosis not present

## 2015-02-14 MED ORDER — LORCASERIN HCL 10 MG PO TABS: ORAL_TABLET | ORAL | Status: DC

## 2015-02-14 NOTE — Progress Notes (Signed)
Subjective:    Patient ID: Tanya Carr, female    DOB: [birth year], 44 y.o.   MRN: [id number]  HPI  44 yo pleasant female here for follow up.  HTN- has been stable on lisinopril 30 mg daily and amlodipine 5 mg daily.  Feels it is working well.  Denies any CP, SOB, or LE edema. Lab Results  Component Value Date   CREATININE 0.93 10/24/2014   Lab Results  Component Value Date   NA 140 10/24/2014   K 4.2 10/24/2014   CL 101 10/24/2014   CO2 21 10/24/2014   Obesity- she is asking to restart Belviq.  Worked well for her in the past.  Lost 13 pounds over a course of 3 months.  Denied any side effects, just could not come in to see me for follow up.  Lab Results  Component Value Date   CHOL 157 10/14/2013   HDL 43.10 10/14/2013   LDLCALC 97 10/14/2013   TRIG 83.0 10/14/2013   CHOLHDL 4 10/14/2013   Lab Results  Component Value Date   TSH 2.36 10/14/2013     Wt Readings from Last 3 Encounters:  02/14/15 205 lb 12 oz (93.328 kg)  01/06/15 203 lb (92.08 kg)  08/07/14 213 lb (96.616 kg)     Patient Active Problem List   Diagnosis Date Noted  . Right shoulder pain 01/19/2014  . Obesity (BMI 30-39.9) 10/14/2013  . Grief 03/24/2013  . Hypertension    Past Medical History  Diagnosis Date  . PONV (postoperative nausea and vomiting)     vagal response per pt  . Headache(784.0)     history of migraines  . Fainting episodes 2011    history of with blood draw, mammogram, post op 2011  . Hypertension     on lisinopril-will do ekg today   Past Surgical History  Procedure Laterality Date  . Ulnar nerve decompression  2011  . Abdominal hysterectomy     History  Substance Use Topics  . Smoking status: Former Smoker    Quit date: 11/16/1996  . Smokeless tobacco: Never Used  . Alcohol Use: Yes     Comment: rare   Family History  Problem Relation Age of Onset  . Cancer Mother   . Multiple sclerosis Mother   . COPD Father   . Pulmonary embolism Brother     Allergies  Allergen Reactions  . Food Hives    Seafood allergy: hives and itching   Current Outpatient Prescriptions on File Prior to Visit  Medication Sig Dispense Refill  . amLODipine (NORVASC) 5 MG tablet TAKE 1 TABLET (5 MG TOTAL) BY MOUTH DAILY. 30 tablet 8  . lisinopril (PRINIVIL,ZESTRIL) 30 MG tablet TAKE 1 TABLET (30 MG TOTAL) BY MOUTH DAILY. 30 tablet 8   No current facility-administered medications on file prior to visit.   The PMH, PSH, Social History, Family History, Medications, and allergies have been reviewed in [facility], and have been updated if relevant.   Review of Systems   See HPI No CP No SOB No SI or HI No HA No blurred vision Objective:   Physical Exam BP 120/82 mmHg  Pulse 67  Temp(Src) 98.1 F (36.7 C) (Oral)  Wt 205 lb 12 oz (93.328 kg)  SpO2 97%  LMP 11/10/2011  General:  Well-developed,well-nourished,in no acute distress; alert,appropriate and cooperative throughout examination Head:  normocephalic and atraumatic.   Lungs:  Normal respiratory effort, chest expands symmetrically. Lungs are clear to auscultation, no crackles or wheezes. Heart:  Normal rate and regular rhythm. S1 and S2 normal without gallop, murmur, click, rub or other extra sounds. Abdomen:  Bowel sounds positive,abdomen soft and non-tender without masses, organomegaly or hernias noted. Msk:  No deformity or scoliosis noted of thoracic or lumbar spine.   Extremities:  No clubbing, cyanosis, edema, or deformity noted with normal full range of motion of all joints.   Neurologic:  alert & oriented X3 and gait normal.   Skin:  Intact without suspicious lesions or rashes Psych:  Cognition and judgment appear intact. Alert and cooperative with normal attention span and concentration. No apparent delusions, illusions, hallucinations     Assessment & Plan:

## 2015-02-14 NOTE — Assessment & Plan Note (Signed)
Did well with Belviq and she would like to restart it. Discussed risks and side effects again today. Rx given to pt with 2 additional refills. Follow up in 3 months. The patient indicates understanding of these issues and agrees with the plan.

## 2015-02-14 NOTE — Progress Notes (Signed)
Pre visit review using our clinic review tool, if applicable. No additional management support is needed unless otherwise documented below in the visit note. 

## 2015-02-14 NOTE — Assessment & Plan Note (Signed)
Well controlled on current rxs. No changes made today. 

## 2015-02-14 NOTE — Patient Instructions (Signed)
Great to see you. Please come to see me in 3 months.

## 2015-04-30 ENCOUNTER — Other Ambulatory Visit: Payer: Self-pay | Admitting: Family Medicine

## 2015-04-30 DIAGNOSIS — Z1231 Encounter for screening mammogram for malignant neoplasm of breast: Secondary | ICD-10-CM

## 2015-05-28 ENCOUNTER — Ambulatory Visit: Payer: BLUE CROSS/BLUE SHIELD | Admitting: Family Medicine

## 2015-05-28 ENCOUNTER — Ambulatory Visit (HOSPITAL_COMMUNITY)
Admission: RE | Admit: 2015-05-28 | Discharge: 2015-05-28 | Disposition: A | Discharge status: Home / Self Care | Payer: BLUE CROSS/BLUE SHIELD | Source: Ambulatory Visit | Attending: Family Medicine | Admitting: Family Medicine

## 2015-05-28 DIAGNOSIS — Z1231 Encounter for screening mammogram for malignant neoplasm of breast: Secondary | ICD-10-CM

## 2015-05-29 ENCOUNTER — Encounter: Payer: Self-pay | Admitting: Family Medicine

## 2015-05-29 ENCOUNTER — Ambulatory Visit (INDEPENDENT_AMBULATORY_CARE_PROVIDER_SITE_OTHER): Payer: BLUE CROSS/BLUE SHIELD | Admitting: Family Medicine

## 2015-05-29 VITALS — BP 118/72 | HR 85 | Temp 97.5°F | Wt 190.8 lb

## 2015-05-29 DIAGNOSIS — N6320 Unspecified lump in the left breast, unspecified quadrant: Secondary | ICD-10-CM | POA: Insufficient documentation

## 2015-05-29 DIAGNOSIS — E669 Obesity, unspecified: Secondary | ICD-10-CM | POA: Diagnosis not present

## 2015-05-29 DIAGNOSIS — N63 Unspecified lump in breast: Secondary | ICD-10-CM | POA: Diagnosis not present

## 2015-05-29 DIAGNOSIS — I10 Essential (primary) hypertension: Secondary | ICD-10-CM

## 2015-05-29 MED ORDER — LORCASERIN HCL 10 MG PO TABS: ORAL_TABLET | ORAL | Status: DC

## 2015-05-29 NOTE — Progress Notes (Signed)
Pre visit review using our clinic review tool, if applicable. No additional management support is needed unless otherwise documented below in the visit note. 

## 2015-05-29 NOTE — Assessment & Plan Note (Signed)
Improving and she is pleased with belviq. No side effects. Rx refilled and she will schedule 3 month follow up with me.

## 2015-05-29 NOTE — Patient Instructions (Signed)
Great to see you. Please stop by to see Tanya Carr on your way out. You're doing great and look fantastic! Please come see me in 3 months.

## 2015-05-29 NOTE — Progress Notes (Signed)
Subjective:    Patient ID: Tanya Carr, female    DOB: 09-Feb-1971, 44 y.o.   MRN: [id number]  HPI  44 yo pleasant female here for follow up.  Left breast mass- noticed a left breast mass a couple of weeks ago.  Went for her scheduled screening mammogram yesterday and answered "yes" on survey asking if she has noticed any masses.  Breast center cancelled her screening mammogram and advised her to see me here today instead.  Last mammogram 04/24/14.  HTN- has been stable on lisinopril 30 mg daily and amlodipine 5 mg daily.  Feels it is working well.  Denies any CP, SOB, or LE edema. Lab Results  Component Value Date   CREATININE 0.93 10/24/2014   Lab Results  Component Value Date   NA 140 10/24/2014   K 4.2 10/24/2014   CL 101 10/24/2014   CO2 21 10/24/2014   Obesity- has restarted Belviq, has lost 17 pounds.  She is pleased.  Denied any side effects, just could not come in to see me for follow up.  Lab Results  Component Value Date   CHOL 157 10/14/2013   HDL 43.10 10/14/2013   LDLCALC 97 10/14/2013   TRIG 83.0 10/14/2013   CHOLHDL 4 10/14/2013   Lab Results  Component Value Date   TSH 2.36 10/14/2013     Wt Readings from Last 3 Encounters:  05/29/15 190 lb 12 oz (86.524 kg)  02/14/15 205 lb 12 oz (93.328 kg)  01/06/15 203 lb (92.08 kg)     Patient Active Problem List   Diagnosis Date Noted  . Obesity (BMI 30-39.9) 10/14/2013  . Hypertension    Past Medical History  Diagnosis Date  . PONV (postoperative nausea and vomiting)     vagal response per pt  . Headache(784.0)     history of migraines  . Fainting episodes 2011    history of with blood draw, mammogram, post op 2011  . Hypertension     on lisinopril-will do ekg today   Past Surgical History  Procedure Laterality Date  . Ulnar nerve decompression  2011  . Abdominal hysterectomy     Social History  Substance Use Topics  . Smoking status: Former Smoker    Quit date: 11/16/1996  .  Smokeless tobacco: Never Used  . Alcohol Use: Yes     Comment: rare   Family History  Problem Relation Age of Onset  . Cancer Mother   . Multiple sclerosis Mother   . COPD Father   . Pulmonary embolism Brother    Allergies  Allergen Reactions  . Food Hives    Seafood allergy: hives and itching   Current Outpatient Prescriptions on File Prior to Visit  Medication Sig Dispense Refill  . amLODipine (NORVASC) 5 MG tablet TAKE 1 TABLET (5 MG TOTAL) BY MOUTH DAILY. 30 tablet 8  . lisinopril (PRINIVIL,ZESTRIL) 30 MG tablet TAKE 1 TABLET (30 MG TOTAL) BY MOUTH DAILY. 30 tablet 8   No current facility-administered medications on file prior to visit.   The PMH, PSH, Social History, Family History, Medications, and allergies have been reviewed in [facility], and have been updated if relevant.   Review of Systems  Constitutional: Negative.   HENT: Negative.   Respiratory: Negative.   Cardiovascular: Negative.   Gastrointestinal: Negative.   Endocrine: Negative.   Genitourinary: Negative.   Musculoskeletal: Negative.   Skin: Negative.   Allergic/Immunologic: Negative.   Neurological: Negative.   Hematological: Negative.   Psychiatric/Behavioral: Negative.  All other systems reviewed and are negative.     Objective:   Physical Exam BP 118/72 mmHg  Pulse 85  Temp(Src) 97.5 F (36.4 C) (Oral)  Wt 190 lb 12 oz (86.524 kg)  SpO2 99%  LMP 11/10/2011  General:  Well-developed,well-nourished,in no acute distress; alert,appropriate and cooperative throughout examination Head:  normocephalic and atraumatic.   Lungs:  Normal respiratory effort, chest expands symmetrically. Lungs are clear to auscultation, no crackles or wheezes. Breasts:  Area of concern for pt- above left breast in chest wall- freely movable- lipoma No masses or nipple changes noted in either breasts Heart:  Normal rate and regular rhythm. S1 and S2 normal without gallop, murmur, click, rub or other extra  sounds. Abdomen:  Bowel sounds positive,abdomen soft and non-tender without masses, organomegaly or hernias noted. Msk:  No deformity or scoliosis noted of thoracic or lumbar spine.   Extremities:  No clubbing, cyanosis, edema, or deformity noted with normal full range of motion of all joints.   Neurologic:  alert & oriented X3 and gait normal.   Skin:  Intact without suspicious lesions or rashes Psych:  Cognition and judgment appear intact. Alert and cooperative with normal attention span and concentration. No apparent delusions, illusions, hallucinations     Assessment & Plan:

## 2015-05-29 NOTE — Assessment & Plan Note (Signed)
New- actually above left breast. ?lipoma diag mammogram and [place] ordered for further evaluation. Orders Placed This Encounter  Procedures  . MM Digital Diagnostic Bilat  . US BREAST COMPLETE UNI LEFT INC AXILLA

## 2015-05-29 NOTE — Assessment & Plan Note (Signed)
Well controlled.  No changes made. 

## 2015-05-30 ENCOUNTER — Ambulatory Visit
Admission: RE | Admit: 2015-05-30 | Discharge: 2015-05-30 | Disposition: A | Discharge status: Home / Self Care | Payer: BLUE CROSS/BLUE SHIELD | Source: Ambulatory Visit | Attending: Family Medicine | Admitting: Family Medicine

## 2015-05-30 DIAGNOSIS — N6320 Unspecified lump in the left breast, unspecified quadrant: Secondary | ICD-10-CM

## 2015-08-06 ENCOUNTER — Encounter: Payer: BLUE CROSS/BLUE SHIELD | Admitting: Family Medicine

## 2015-08-15 ENCOUNTER — Ambulatory Visit (INDEPENDENT_AMBULATORY_CARE_PROVIDER_SITE_OTHER): Payer: BLUE CROSS/BLUE SHIELD | Admitting: Family Medicine

## 2015-08-15 VITALS — BP 130/80 | HR 66 | Temp 98.1°F | Ht 64.0 in | Wt 190.0 lb

## 2015-08-15 DIAGNOSIS — Z01419 Encounter for gynecological examination (general) (routine) without abnormal findings: Secondary | ICD-10-CM | POA: Insufficient documentation

## 2015-08-15 DIAGNOSIS — E669 Obesity, unspecified: Secondary | ICD-10-CM

## 2015-08-15 DIAGNOSIS — I10 Essential (primary) hypertension: Secondary | ICD-10-CM

## 2015-08-15 DIAGNOSIS — Z0000 Encounter for general adult medical examination without abnormal findings: Secondary | ICD-10-CM | POA: Diagnosis not present

## 2015-08-15 LAB — CBC WITH DIFFERENTIAL/PLATELET
BASOS ABS: 0 10*3/uL (ref 0.0–0.1)
BASOS PCT: 0.4 % (ref 0.0–3.0)
EOS ABS: 0.2 10*3/uL (ref 0.0–0.7)
Eosinophils Relative: 2.7 % (ref 0.0–5.0)
HCT: 40.7 % (ref 36.0–46.0)
HEMOGLOBIN: 13.3 g/dL (ref 12.0–15.0)
Lymphocytes Relative: 19 % (ref 12.0–46.0)
Lymphs Abs: 1.5 10*3/uL (ref 0.7–4.0)
MCHC: 32.7 g/dL (ref 30.0–36.0)
MCV: 81.6 fl (ref 78.0–100.0)
MONO ABS: 0.4 10*3/uL (ref 0.1–1.0)
Monocytes Relative: 5.2 % (ref 3.0–12.0)
Neutro Abs: 5.6 10*3/uL (ref 1.4–7.7)
Neutrophils Relative %: 72.7 % (ref 43.0–77.0)
PLATELETS: 244 10*3/uL (ref 150.0–400.0)
RBC: 4.99 Mil/uL (ref 3.87–5.11)
RDW: 14.3 % (ref 11.5–15.5)
WBC: 7.7 10*3/uL (ref 4.0–10.5)

## 2015-08-15 LAB — COMPREHENSIVE METABOLIC PANEL
ALBUMIN: 4 g/dL (ref 3.5–5.2)
ALT: 12 U/L (ref 0–35)
AST: 13 U/L (ref 0–37)
Alkaline Phosphatase: 41 U/L (ref 39–117)
BUN: 19 mg/dL (ref 6–23)
CHLORIDE: 103 meq/L (ref 96–112)
CO2: 29 meq/L (ref 19–32)
CREATININE: 0.91 mg/dL (ref 0.40–1.20)
Calcium: 9.1 mg/dL (ref 8.4–10.5)
GFR: 70.68 mL/min (ref >60.00)
Glucose, Bld: 88 mg/dL (ref 70–99)
Potassium: 3.9 mEq/L (ref 3.5–5.1)
SODIUM: 138 meq/L (ref 135–145)
Total Bilirubin: 0.3 mg/dL (ref 0.2–1.2)
Total Protein: 7 g/dL (ref 6.0–8.3)

## 2015-08-15 LAB — LIPID PANEL
CHOL/HDL RATIO: 3
Cholesterol: 160 mg/dL (ref 0–200)
HDL: 50 mg/dL (ref >39.00)
LDL CALC: 95 mg/dL (ref 0–99)
NONHDL: 109.81
Triglycerides: 75 mg/dL (ref 0.0–149.0)
VLDL: 15 mg/dL (ref 0.0–40.0)

## 2015-08-15 LAB — TSH: TSH: 3.95 u[IU]/mL (ref 0.35–4.50)

## 2015-08-15 LAB — HEMOGLOBIN A1C: HEMOGLOBIN A1C: 5.8 % (ref 4.6–6.5)

## 2015-08-15 MED ORDER — LISINOPRIL 30 MG PO TABS: 30.0000 mg | ORAL_TABLET | Freq: Every day | ORAL | Status: DC

## 2015-08-15 MED ORDER — LORCASERIN HCL 10 MG PO TABS: ORAL_TABLET | ORAL | Status: DC

## 2015-08-15 NOTE — Patient Instructions (Signed)
Great to see you. Happy Holidays.  Please STOP taking norvasc (amlodipine). Keep checking your blood pressure at home.  You can get your lab results on mychart.  Please let me know if you have any questions.

## 2015-08-15 NOTE — Progress Notes (Signed)
Subjective:   Patient ID: Tanya Carr, female    DOB: 1970-09-03, 44 y.o.   MRN: [id number]  PARMINDER CERICOLA is a pleasant 44 y.o. year old female who presents to clinic today with Annual Exam  and follow up of chronic medical conditions on 08/15/2015  HPI:  Remote h/o hysterectomy.  Does still have her ovaries.  Mammogram 05/30/15, recommended follow up in 6 months.  HTN- was well controlled on Amlodipine 5 mg daily and Prinvil 30 mg daily but now that she is losing weight, she is getting dizzy when she stands.  At home, BP ranging in low 100s/70s. She has been more fatigued.  Obesity- maintaining weight with Belviq.  Eating more around the holidays. Denies any side effects.   Lab Results  Component Value Date   CHOL 157 10/14/2013   HDL 43.10 10/14/2013   LDLCALC 97 10/14/2013   TRIG 83.0 10/14/2013   CHOLHDL 4 10/14/2013   Lab Results  Component Value Date   CREATININE 0.93 10/24/2014   Lab Results  Component Value Date   TSH 2.36 10/14/2013   Lab Results  Component Value Date   NA 140 10/24/2014   K 4.2 10/24/2014   CL 101 10/24/2014   CO2 21 10/24/2014   Lab Results  Component Value Date   ALT 16 10/24/2014   AST 23 10/24/2014   ALKPHOS 49 10/24/2014   BILITOT 0.3 10/24/2014    Wt Readings from Last 3 Encounters:  08/15/15 190 lb (86.183 kg)  05/29/15 190 lb 12 oz (86.524 kg)  02/14/15 205 lb 12 oz (93.328 kg)   Current Outpatient Prescriptions on File Prior to Visit  Medication Sig Dispense Refill  . amLODipine (NORVASC) 5 MG tablet TAKE 1 TABLET (5 MG TOTAL) BY MOUTH DAILY. 30 tablet 8  . lisinopril (PRINIVIL,ZESTRIL) 30 MG tablet TAKE 1 TABLET (30 MG TOTAL) BY MOUTH DAILY. 30 tablet 8  . Lorcaserin HCl (BELVIQ) 10 MG TABS TAKE 1 TABLET BY MOUTH 2 TIMES A DAY. 60 tablet 2   No current facility-administered medications on file prior to visit.    Allergies  Allergen Reactions  . Food Hives    Seafood allergy: hives and itching     Past Medical History  Diagnosis Date  . PONV (postoperative nausea and vomiting)     vagal response per pt  . Headache(784.0)     history of migraines  . Fainting episodes 2011    history of with blood draw, mammogram, post op 2011  . Hypertension     on lisinopril-will do ekg today    Past Surgical History  Procedure Laterality Date  . Ulnar nerve decompression  2011  . Abdominal hysterectomy      Family History  Problem Relation Age of Onset  . Cancer Mother   . Multiple sclerosis Mother   . COPD Father   . Pulmonary embolism Brother     Social History   Social History  . Marital Status: Single    Spouse Name: N/A  . Number of Children: N/A  . Years of Education: N/A   Occupational History  . Not on file.   Social History Main Topics  . Smoking status: Former Smoker    Quit date: 11/16/1996  . Smokeless tobacco: Never Used  . Alcohol Use: Yes     Comment: rare  . Drug Use: No  . Sexual Activity: Not on file   Other Topics Concern  . Not on file   Social  History Narrative   G0   Travel agent   The PMH, PSH, Social History, Family History, Medications, and allergies have been reviewed in [facility], and have been updated if relevant.   Review of Systems  Constitutional: Positive for fatigue.  HENT: Negative.   Eyes: Negative.   Respiratory: Negative.   Cardiovascular: Negative.   Gastrointestinal: Negative.   Musculoskeletal: Negative.   Skin: Negative.   Neurological: Positive for dizziness. Negative for seizures, facial asymmetry, light-headedness, numbness and headaches.  Hematological: Negative.   Psychiatric/Behavioral: Negative.   All other systems reviewed and are negative.      Objective:    BP 130/80 mmHg  Pulse 66  Temp(Src) 98.1 F (36.7 C) (Oral)  Ht 5\' 4"  (1.626 m)  Wt 190 lb (86.183 kg)  BMI 32.60 kg/m2  SpO2 100%  LMP 11/10/2011   Physical Exam    General:  Well-developed,well-nourished,in no acute distress;  alert,appropriate and cooperative throughout examination Head:  normocephalic and atraumatic.   Eyes:  vision grossly intact, pupils equal, pupils round, and pupils reactive to light.   Ears:  R ear normal and L ear normal.   Nose:  no external deformity.   Mouth:  good dentition.   Neck:  No deformities, masses, or tenderness noted. Breasts:  No mass, nodules, thickening, tenderness, bulging, retraction, inflamation, nipple discharge or skin changes noted.   Lungs:  Normal respiratory effort, chest expands symmetrically. Lungs are clear to auscultation, no crackles or wheezes. Heart:  Normal rate and regular rhythm. S1 and S2 normal without gallop, murmur, click, rub or other extra sounds. Abdomen:  Bowel sounds positive,abdomen soft and non-tender without masses, organomegaly or hernias noted. Rectal:  no external abnormalities.   Genitalia:  Pelvic Exam:        External: normal female genitalia without lesions or masses        Vagina: normal without lesions or masses        Cervix: absent        Adnexa: normal bimanual exam without masses or fullness        Uterus: absent        Msk:  No deformity or scoliosis noted of thoracic or lumbar spine.   Extremities:  No clubbing, cyanosis, edema, or deformity noted with normal full range of motion of all joints.   Neurologic:  alert & oriented X3 and gait normal.   Skin:  Intact without suspicious lesions or rashes Cervical Nodes:  No lymphadenopathy noted Axillary Nodes:  No palpable lymphadenopathy Psych:  Cognition and judgment appear intact. Alert and cooperative with normal attention span and concentration. No apparent delusions, illusions, hallucinations      Assessment & Plan:   Well woman exam - Plan: CBC with Differential/Platelet, Comprehensive metabolic panel, Lipid panel, TSH, HIV antibody (with reflex), Hemoglobin A1c  Essential hypertension  Obesity (BMI 30-39.9) No Follow-up on file.

## 2015-08-15 NOTE — Assessment & Plan Note (Signed)
Reviewed preventive care protocols, scheduled due services, and updated immunizations Discussed nutrition, exercise, diet, and healthy lifestyle.  Orders Placed This Encounter  Procedures  . CBC with Differential/Platelet  . Comprehensive metabolic panel  . Lipid panel  . TSH  . HIV antibody (with reflex)    

## 2015-08-15 NOTE — Progress Notes (Signed)
Pre visit review using our clinic review tool, if applicable. No additional management support is needed unless otherwise documented below in the visit note. 

## 2015-08-15 NOTE — Assessment & Plan Note (Signed)
Now with hypotension at home, symptomatic. D/c amlodipine. She will continue to monitor BP at home.

## 2015-08-15 NOTE — Assessment & Plan Note (Signed)
Stable with belviq. Rx refilled today.

## 2015-08-15 NOTE — Addendum Note (Signed)
Addended by: Daralene Milch C on: 08/15/2015 11:24 AM   Modules accepted: Miquel Dunn

## 2015-08-16 LAB — HIV ANTIBODY (ROUTINE TESTING W REFLEX): HIV 1&2 Ab, 4th Generation: NONREACTIVE

## 2015-09-04 ENCOUNTER — Other Ambulatory Visit: Payer: Self-pay | Admitting: Internal Medicine

## 2015-09-04 ENCOUNTER — Telehealth: Payer: Self-pay

## 2015-09-04 MED ORDER — AMLODIPINE BESYLATE 5 MG PO TABS: 5.0000 mg | ORAL_TABLET | Freq: Every day | ORAL | Status: DC

## 2015-09-04 NOTE — Telephone Encounter (Signed)
Did she have any adverse effects on Norvasc? If not, we can restart that one.

## 2015-09-04 NOTE — Telephone Encounter (Signed)
Norvasc sent to [facility]

## 2015-09-04 NOTE — Telephone Encounter (Signed)
Spoke to pt who states she did not have any adverse reactions to norvasc and is willing to restart. Can be sent to [facility]

## 2015-09-04 NOTE — Telephone Encounter (Signed)
Pt left v/m; pt was seen 08/15/15, amlodipine was stopped at that time;now pt said BP readings are high for her(BP 145/94 on 09/02/15; BP 172/82 later on 09/02/15;no rushing or upset and had not eaten food with salt; and heart rate in the 40s; today BP 155/98; pt having h/a,dizziness and nausea today; No CP or SOB.Pt taking lisinopril 30 mg one daily. Pt request to start another BP med. Pt request cb. [facility].

## 2015-09-07 ENCOUNTER — Telehealth: Payer: Self-pay | Admitting: Family Medicine

## 2015-09-07 NOTE — Telephone Encounter (Signed)
Pt called inquiring about the labs drawn at her last visit (cpe in December 2016). She said she was billed for one lab which she was not aware was being ordered and stated she would have refused to have the test done if she knew about it. She can be reached at [phone].

## 2015-09-07 NOTE — Telephone Encounter (Signed)
Please call pt to help her with billing issue.

## 2015-09-11 NOTE — Telephone Encounter (Signed)
I spoke w/Tanya Carr and she states she did not request HIV testing and did not authorize it.  She states if she had known she would have denied b/c she is not high risk and was tested in her 52's. Please advise. Thank you.

## 2015-09-11 NOTE — Telephone Encounter (Signed)
Please apologize and see if we can remove charge.  Thank you.

## 2015-10-31 ENCOUNTER — Other Ambulatory Visit: Payer: Self-pay | Admitting: Internal Medicine

## 2015-10-31 NOTE — Telephone Encounter (Signed)
Please confirm--last OV was CPE and i saw in note that this medication was to be d/c--please advise if pt should continue medication

## 2015-10-31 NOTE — Telephone Encounter (Signed)
Thanks for asking.  It looks like it was restarted by REgina. See phone note from 09/04/15. Ok to refill.

## 2015-10-31 NOTE — Telephone Encounter (Signed)
Rx sent through e-scribe  

## 2015-12-12 ENCOUNTER — Other Ambulatory Visit: Payer: Self-pay | Admitting: Family Medicine

## 2015-12-12 DIAGNOSIS — N63 Unspecified lump in unspecified breast: Secondary | ICD-10-CM

## 2015-12-20 ENCOUNTER — Ambulatory Visit
Admission: RE | Admit: 2015-12-20 | Discharge: 2015-12-20 | Disposition: A | Discharge status: Home / Self Care | Payer: BLUE CROSS/BLUE SHIELD | Source: Ambulatory Visit | Attending: Family Medicine | Admitting: Family Medicine

## 2015-12-20 DIAGNOSIS — N63 Unspecified lump in unspecified breast: Secondary | ICD-10-CM

## 2015-12-25 ENCOUNTER — Other Ambulatory Visit: Payer: BLUE CROSS/BLUE SHIELD

## 2016-03-20 ENCOUNTER — Other Ambulatory Visit: Payer: Self-pay | Admitting: Family Medicine

## 2016-07-23 ENCOUNTER — Other Ambulatory Visit: Payer: Self-pay | Admitting: Family Medicine

## 2016-07-23 DIAGNOSIS — N6320 Unspecified lump in the left breast, unspecified quadrant: Secondary | ICD-10-CM

## 2016-08-15 ENCOUNTER — Ambulatory Visit
Admission: RE | Admit: 2016-08-15 | Discharge: 2016-08-15 | Disposition: A | Discharge status: Home / Self Care | Payer: BLUE CROSS/BLUE SHIELD | Source: Ambulatory Visit | Attending: Family Medicine | Admitting: Family Medicine

## 2016-08-15 DIAGNOSIS — N6320 Unspecified lump in the left breast, unspecified quadrant: Secondary | ICD-10-CM

## 2016-08-18 ENCOUNTER — Other Ambulatory Visit: Payer: Self-pay | Admitting: Family Medicine

## 2016-08-19 ENCOUNTER — Ambulatory Visit (INDEPENDENT_AMBULATORY_CARE_PROVIDER_SITE_OTHER): Payer: BLUE CROSS/BLUE SHIELD | Admitting: Family Medicine

## 2016-08-19 ENCOUNTER — Encounter: Payer: Self-pay | Admitting: Family Medicine

## 2016-08-19 VITALS — BP 130/82 | HR 68 | Temp 98.1°F | Ht 64.0 in | Wt 195.0 lb

## 2016-08-19 DIAGNOSIS — Z01419 Encounter for gynecological examination (general) (routine) without abnormal findings: Secondary | ICD-10-CM | POA: Diagnosis not present

## 2016-08-19 DIAGNOSIS — I10 Essential (primary) hypertension: Secondary | ICD-10-CM

## 2016-08-19 LAB — LIPID PANEL
CHOL/HDL RATIO: 3
Cholesterol: 168 mg/dL (ref 0–200)
HDL: 54.5 mg/dL (ref >39.00)
LDL Cholesterol: 102 mg/dL — ABNORMAL HIGH (ref 0–99)
NONHDL: 113.72
Triglycerides: 61 mg/dL (ref 0.0–149.0)
VLDL: 12.2 mg/dL (ref 0.0–40.0)

## 2016-08-19 LAB — CBC WITH DIFFERENTIAL/PLATELET
BASOS ABS: 0 10*3/uL (ref 0.0–0.1)
Basophils Relative: 0.5 % (ref 0.0–3.0)
EOS ABS: 0.3 10*3/uL (ref 0.0–0.7)
Eosinophils Relative: 5.2 % — ABNORMAL HIGH (ref 0.0–5.0)
HCT: 41.6 % (ref 36.0–46.0)
Hemoglobin: 13.8 g/dL (ref 12.0–15.0)
LYMPHS ABS: 1.4 10*3/uL (ref 0.7–4.0)
Lymphocytes Relative: 21.5 % (ref 12.0–46.0)
MCHC: 33.2 g/dL (ref 30.0–36.0)
MCV: 81.4 fl (ref 78.0–100.0)
MONO ABS: 0.4 10*3/uL (ref 0.1–1.0)
MONOS PCT: 7 % (ref 3.0–12.0)
NEUTROS PCT: 65.8 % (ref 43.0–77.0)
Neutro Abs: 4.2 10*3/uL (ref 1.4–7.7)
Platelets: 244 10*3/uL (ref 150.0–400.0)
RBC: 5.11 Mil/uL (ref 3.87–5.11)
RDW: 14 % (ref 11.5–15.5)
WBC: 6.4 10*3/uL (ref 4.0–10.5)

## 2016-08-19 LAB — COMPREHENSIVE METABOLIC PANEL
ALK PHOS: 38 U/L — AB (ref 39–117)
ALT: 16 U/L (ref 0–35)
AST: 16 U/L (ref 0–37)
Albumin: 4 g/dL (ref 3.5–5.2)
BILIRUBIN TOTAL: 0.3 mg/dL (ref 0.2–1.2)
BUN: 20 mg/dL (ref 6–23)
CO2: 30 meq/L (ref 19–32)
Calcium: 8.9 mg/dL (ref 8.4–10.5)
Chloride: 104 mEq/L (ref 96–112)
Creatinine, Ser: 0.85 mg/dL (ref 0.40–1.20)
GFR: 76.13 mL/min (ref >60.00)
GLUCOSE: 93 mg/dL (ref 70–99)
Potassium: 4.2 mEq/L (ref 3.5–5.1)
SODIUM: 139 meq/L (ref 135–145)
TOTAL PROTEIN: 6.7 g/dL (ref 6.0–8.3)

## 2016-08-19 LAB — TSH: TSH: 3.01 u[IU]/mL (ref 0.35–4.50)

## 2016-08-19 NOTE — Progress Notes (Signed)
Subjective:   Patient ID: Tanya Carr, female    DOB: [birth year], 45 y.o.   MRN: [id number]  Tanya Carr is a pleasant 45 y.o. year old female who presents to clinic today with Annual Exam  on 08/19/2016  HPI:  Remote h/o hysterectomy. Does still have ovaries.  Mammogram 08/15/16  HTN- has been well controlled with Norvasc 5 mg daily and Lisinopril 30 mg daily. Denies HA, blurred vision, CP, SOB or LE edema. Lab Results  Component Value Date   CREATININE 0.91 08/15/2015   Lab Results  Component Value Date   CHOL 160 08/15/2015   HDL 50.00 08/15/2015   [place] 95 08/15/2015   TRIG 75.0 08/15/2015   CHOLHDL 3 08/15/2015   Lab Results  Component Value Date   ALT 12 08/15/2015   AST 13 08/15/2015   ALKPHOS 41 08/15/2015   BILITOT 0.3 08/15/2015   Lab Results  Component Value Date   TSH 3.95 08/15/2015   Lab Results  Component Value Date   WBC 7.7 08/15/2015   HGB 13.3 08/15/2015   HCT 40.7 08/15/2015   MCV 81.6 08/15/2015   PLT 244.0 08/15/2015   Current Outpatient Prescriptions on File Prior to Visit  Medication Sig Dispense Refill  . amLODipine (NORVASC) 5 MG tablet TAKE 1 TABLET (5 MG TOTAL) BY MOUTH DAILY. 90 tablet 1  . lisinopril (PRINIVIL,ZESTRIL) 30 MG tablet Take 1 tablet (30 mg total) by mouth daily. OV REQUIRED FOR ADDITIONAL REFILLS 30 tablet 0   No current facility-administered medications on file prior to visit.     Allergies  Allergen Reactions  . Food Hives    Seafood allergy: hives and itching    Past Medical History:  Diagnosis Date  . Fainting episodes 2011   history of with blood draw, mammogram, post op 2011  . Headache(784.0)    history of migraines  . Hypertension    on lisinopril-will do ekg today  . PONV (postoperative nausea and vomiting)    vagal response per pt    Past Surgical History:  Procedure Laterality Date  . ABDOMINAL HYSTERECTOMY    . ulnar nerve decompression  2011    Family History    Problem Relation Age of Onset  . Cancer Mother   . Multiple sclerosis Mother   . COPD Father   . Pulmonary embolism Brother     Social History   Social History  . Marital status: Single    Spouse name: N/A  . Number of children: N/A  . Years of education: N/A   Occupational History  . Not on file.   Social History Main Topics  . Smoking status: Former Smoker    Quit date: 11/16/1996  . Smokeless tobacco: Never Used  . Alcohol use Yes     Comment: rare  . Drug use: No  . Sexual activity: Not on file   Other Topics Concern  . Not on file   Social History Narrative   G0   Travel agent   The PMH, PSH, Social History, Family History, Medications, and allergies have been reviewed in [facility], and have been updated if relevant.  Review of Systems  Constitutional: Negative.   HENT: Negative.   Eyes: Negative.   Respiratory: Negative.   Cardiovascular: Negative.   Gastrointestinal: Negative.   Endocrine: Negative.   Genitourinary: Negative.   Musculoskeletal: Negative.   Skin: Negative.   Allergic/Immunologic: Negative.   Neurological: Negative.   Psychiatric/Behavioral: Negative.   All other systems reviewed  and are negative.      Objective:    BP (!) 152/96   Pulse 68   Temp 98.1 F (36.7 C) (Oral)   Ht 5\' 4"  (1.626 m)   Wt 195 lb (88.5 kg)   LMP 11/10/2011   SpO2 99%   BMI 33.47 kg/m   BP Readings from Last 3 Encounters:  08/19/16 (!) 152/96  08/15/15 130/80  05/29/15 118/72    Physical Exam   General:  Well-developed,well-nourished,in no acute distress; alert,appropriate and cooperative throughout examination Head:  normocephalic and atraumatic.   Eyes:  vision grossly intact, PERRL Ears:  R ear normal and L ear normal externally, TMs clear bilaterally Nose:  no external deformity.   Mouth:  good dentition.   Neck:  No deformities, masses, or tenderness noted. Breasts:  No mass, nodules, thickening, tenderness, bulging, retraction,  inflamation, nipple discharge or skin changes noted.   Lungs:  Normal respiratory effort, chest expands symmetrically. Lungs are clear to auscultation, no crackles or wheezes. Heart:  Normal rate and regular rhythm. S1 and S2 normal without gallop, murmur, click, rub or other extra sounds. Abdomen:  Bowel sounds positive,abdomen soft and non-tender without masses, organomegaly or hernias noted. Rectal:  no external abnormalities.   Genitalia:  Pelvic Exam:        External: normal female genitalia without lesions or masses        Vagina: normal without lesions or masses        Cervix: normal without lesions or masses        Adnexa: normal bimanual exam without masses or fullness    Msk:  No deformity or scoliosis noted of thoracic or lumbar spine.   Extremities:  No clubbing, cyanosis, edema, or deformity noted with normal full range of motion of all joints.   Neurologic:  alert & oriented X3 and gait normal.   Skin:  Intact without suspicious lesions or rashes Cervical Nodes:  No lymphadenopathy noted Axillary Nodes:  No palpable lymphadenopathy Psych:  Cognition and judgment appear intact. Alert and cooperative with normal attention span and concentration. No apparent delusions, illusions, hallucinations       Assessment & Plan:   Well woman exam - Plan: CBC with Differential/Platelet, Comprehensive metabolic panel, Lipid panel, TSH  Essential hypertension No Follow-up on file.

## 2016-08-19 NOTE — Assessment & Plan Note (Signed)
Reviewed preventive care protocols, scheduled due services, and updated immunizations Discussed nutrition, exercise, diet, and healthy lifestyle.  Orders Placed This Encounter  Procedures  . CBC with Differential/Platelet  . Comprehensive metabolic panel  . Lipid panel  . TSH     

## 2016-08-19 NOTE — Assessment & Plan Note (Signed)
Well controlled. No changes made today. 

## 2016-08-19 NOTE — Patient Instructions (Signed)
Great to see you. Happy Holidays.   

## 2016-09-04 ENCOUNTER — Encounter: Payer: Self-pay | Admitting: Family Medicine

## 2016-09-04 ENCOUNTER — Ambulatory Visit (INDEPENDENT_AMBULATORY_CARE_PROVIDER_SITE_OTHER): Payer: BLUE CROSS/BLUE SHIELD | Admitting: Family Medicine

## 2016-09-04 VITALS — BP 114/72 | HR 60 | Temp 97.7°F | Wt 201.2 lb

## 2016-09-04 DIAGNOSIS — M5442 Lumbago with sciatica, left side: Secondary | ICD-10-CM | POA: Diagnosis not present

## 2016-09-04 MED ORDER — PREDNISONE 20 MG PO TABS: 40.0000 mg | ORAL_TABLET | Freq: Every day | ORAL | 0 refills | Status: DC

## 2016-09-04 NOTE — Progress Notes (Signed)
SUBJECTIVE:  Tanya Carr is a 46 y.o. female who complains of low back pain for 3 week(s), positional with bending or lifting, with radiation down the legs. Precipitating factors: recent heavy lifting. Prior history of back problems: recurrent self limited episodes of low back pain in the past. There is no numbness in the legs.  Current Outpatient Prescriptions on File Prior to Visit  Medication Sig Dispense Refill  . amLODipine (NORVASC) 5 MG tablet TAKE 1 TABLET (5 MG TOTAL) BY MOUTH DAILY. 90 tablet 1  . lisinopril (PRINIVIL,ZESTRIL) 30 MG tablet Take 1 tablet (30 mg total) by mouth daily. OV REQUIRED FOR ADDITIONAL REFILLS 30 tablet 0   No current facility-administered medications on file prior to visit.     Allergies  Allergen Reactions  . Food Hives    Seafood allergy: hives and itching    Past Medical History:  Diagnosis Date  . Fainting episodes 2011   history of with blood draw, mammogram, post op 2011  . Headache(784.0)    history of migraines  . Hypertension    on lisinopril-will do ekg today  . PONV (postoperative nausea and vomiting)    vagal response per pt    Past Surgical History:  Procedure Laterality Date  . ABDOMINAL HYSTERECTOMY    . ulnar nerve decompression  2011    Family History  Problem Relation Age of Onset  . Cancer Mother   . Multiple sclerosis Mother   . COPD Father   . Pulmonary embolism Brother     Social History   Social History  . Marital status: Single    Spouse name: N/A  . Number of children: N/A  . Years of education: N/A   Occupational History  . Not on file.   Social History Main Topics  . Smoking status: Former Smoker    Quit date: 11/16/1996  . Smokeless tobacco: Never Used  . Alcohol use Yes     Comment: rare  . Drug use: No  . Sexual activity: Not on file   Other Topics Concern  . Not on file   Social History Narrative   G0   Travel agent   The PMH, PSH, Social History, Family History, Medications,  and allergies have been reviewed in [facility], and have been updated if relevant.  OBJECTIVE: BP 114/72   Pulse 60   Temp 97.7 F (36.5 C) (Oral)   Wt 201 lb 4 oz (91.3 kg)   LMP 11/10/2011   SpO2 98%   BMI 34.54 kg/m   BP 114/72   Pulse 60   Temp 97.7 F (36.5 C) (Oral)   Wt 201 lb 4 oz (91.3 kg)   LMP 11/10/2011   SpO2 98%   BMI 34.54 kg/m   Patient appears to be in mild to moderate pain, antalgic gait noted. Lumbosacral spine area reveals no local tenderness or mass.  Painful and reduced LS ROM noted. Straight leg raise is positive at 45 degrees on left. DTR's, motor strength and sensation normal, including heel and toe gait.  Peripheral pulses are palpable. X-Ray: not indicated.  ASSESSMENT:  herniated disc likely at L5-S1  PLAN: Prednisone 40 mg daily x 7 days,  For acute pain, rest, intermittent application of heat (do not sleep on heating pad)  Discussed longer term treatment plan and discussed a home back care exercise program with flexion exercise routine. Proper lifting with avoidance of heavy lifting discussed. Consider Physical Therapy and XRay studies if not improving. Call or return to  clinic prn if these symptoms worsen or fail to improve as anticipated.

## 2016-09-04 NOTE — Patient Instructions (Signed)
Sciatica Sciatica is pain, numbness, weakness, or tingling along the path of the sciatic nerve. The sciatic nerve starts in the lower back and runs down the back of each leg. The nerve controls the muscles in the lower leg and in the back of the knee. It also provides feeling (sensation) to the back of the thigh, the lower leg, and the sole of the foot. Sciatica is a symptom of another medical condition that pinches or puts pressure on the sciatic nerve. Generally, sciatica only affects one side of the body. Sciatica usually goes away on its own or with treatment. In some cases, sciatica may keep coming back (recur). What are the causes? This condition is caused by pressure on the sciatic nerve, or pinching of the sciatic nerve. This may be the result of:  A disk in between the bones of the spine (vertebrae) bulging out too far (herniated disk).  Age-related changes in the spinal disks (degenerative disk disease).  A pain disorder that affects a muscle in the buttock (piriformis syndrome).  Extra bone growth (bone spur) near the sciatic nerve.  An injury or break (fracture) of the pelvis.  Pregnancy.  Tumor (rare). What increases the risk? The following factors may make you more likely to develop this condition:  Playing sports that place pressure or stress on the spine, such as football or weight lifting.  Having poor strength and flexibility.  A history of back injury.  A history of back surgery.  Sitting for long periods of time.  Doing activities that involve repetitive bending or lifting.  Obesity. What are the signs or symptoms? Symptoms can vary from mild to very severe, and they may include:  Any of these problems in the lower back, leg, hip, or buttock:  Mild tingling or dull aches.  Burning sensations.  Sharp pains.  Numbness in the back of the calf or the sole of the foot.  Leg weakness.  Severe back pain that makes movement difficult. These symptoms may  get worse when you cough, sneeze, or laugh, or when you sit or stand for long periods of time. Being overweight may also make symptoms worse. In some cases, symptoms may recur over time. How is this diagnosed? This condition may be diagnosed based on:  Your symptoms.  A physical exam. Your health care provider may ask you to do certain movements to check whether those movements trigger your symptoms.  You may have tests, including:  Blood tests.  X-rays.  MRI.  CT scan. How is this treated? In many cases, this condition improves on its own, without any treatment. However, treatment may include:  Reducing or modifying physical activity during periods of pain.  Exercising and stretching to strengthen your abdomen and improve the flexibility of your spine.  Icing and applying heat to the affected area.  Medicines that help:  To relieve pain and swelling.  To relax your muscles.  Injections of medicines that help to relieve pain, irritation, and inflammation around the sciatic nerve (steroids).  Surgery. Follow these instructions at home: Medicines   Take over-the-counter and prescription medicines only as told by your health care provider.  Do not drive or operate heavy machinery while taking prescription pain medicine. Managing pain   If directed, apply ice to the affected area.  Put ice in a plastic bag.  Place a towel between your skin and the bag.  Leave the ice on for 20 minutes, 2-3 times a day.  After icing, apply heat to the   affected area before you exercise or as often as told by your health care provider. Use the heat source that your health care provider recommends, such as a moist heat pack or a heating pad.  Place a towel between your skin and the heat source.  Leave the heat on for 20-30 minutes.  Remove the heat if your skin turns bright red. This is especially important if you are unable to feel pain, heat, or cold. You may have a greater risk of  getting burned. Activity   Return to your normal activities as told by your health care provider. Ask your health care provider what activities are safe for you.  Avoid activities that make your symptoms worse.  Take brief periods of rest throughout the day. Resting in a lying or standing position is usually better than sitting to rest.  When you rest for longer periods, mix in some mild activity or stretching between periods of rest. This will help to prevent stiffness and pain.  Avoid sitting for long periods of time without moving. Get up and move around at least one time each hour.  Exercise and stretch regularly, as told by your health care provider.  Do not lift anything that is heavier than 10 lb (4.5 kg) while you have symptoms of sciatica. When you do not have symptoms, you should still avoid heavy lifting, especially repetitive heavy lifting.  When you lift objects, always use proper lifting technique, which includes:  Bending your knees.  Keeping the load close to your body.  Avoiding twisting. General instructions   Use good posture.  Avoid leaning forward while sitting.  Avoid hunching over while standing.  Maintain a healthy weight. Excess weight puts extra stress on your back and makes it difficult to maintain good posture.  Wear supportive, comfortable shoes. Avoid wearing high heels.  Avoid sleeping on a mattress that is too soft or too hard. A mattress that is firm enough to support your back when you sleep may help to reduce your pain.  Keep all follow-up visits as told by your health care provider. This is important. Contact a health care provider if:  You have pain that wakes you up when you are sleeping.  You have pain that gets worse when you lie down.  Your pain is worse than you have experienced in the past.  Your pain lasts longer than 4 weeks.  You experience unexplained weight loss. Get help right away if:  You lose control of your bowel  or bladder (incontinence).  You have:  Weakness in your lower back, pelvis, buttocks, or legs that gets worse.  Redness or swelling of your back.  A burning sensation when you urinate. This information is not intended to replace advice given to you by your health care provider. Make sure you discuss any questions you have with your health care provider. Document Released: 08/12/2001 Document Revised: 01/22/2016 Document Reviewed: 04/27/2015 Elsevier Interactive Patient Education  2017 [employer]  

## 2016-09-12 ENCOUNTER — Telehealth: Payer: Self-pay

## 2016-09-12 DIAGNOSIS — M5440 Lumbago with sciatica, unspecified side: Secondary | ICD-10-CM

## 2016-09-12 NOTE — Telephone Encounter (Signed)
I am sorry she has had no relief.  Yes, I would suggest PT and xray at this point. I will place referral for both.  She can come in to our office to have xray done.

## 2016-09-12 NOTE — Telephone Encounter (Signed)
Pt left v/m; pt seen 09/04/16 for sciatica; pt has finished prednisone with no relief of pain. Pt wanted to know if needed refill of prednisone or a different med. [facility]. Pt request cb. At 09/04/16 visit mentioned possible PT or xray studies.Please advise.

## 2016-09-12 NOTE — Telephone Encounter (Signed)
Spoke to pt and advised per Dr Deborra Medina. Pt advised to await a call with appt details for referral and states she will RTO to have XRay completed.

## 2016-09-16 ENCOUNTER — Ambulatory Visit (INDEPENDENT_AMBULATORY_CARE_PROVIDER_SITE_OTHER)
Admission: RE | Admit: 2016-09-16 | Discharge: 2016-09-16 | Disposition: A | Discharge status: Home / Self Care | Payer: BLUE CROSS/BLUE SHIELD | Source: Ambulatory Visit | Attending: Family Medicine | Admitting: Family Medicine

## 2016-09-16 ENCOUNTER — Telehealth: Payer: Self-pay | Admitting: Family Medicine

## 2016-09-16 DIAGNOSIS — M5440 Lumbago with sciatica, unspecified side: Secondary | ICD-10-CM | POA: Diagnosis not present

## 2016-09-16 NOTE — Telephone Encounter (Signed)
Spoke with pt she stated she didn't want to go to PT because of the expense.  She stated she goes to gym a couple times a week.  She would like a call back she has additional questions about her xray results

## 2016-09-19 ENCOUNTER — Other Ambulatory Visit: Payer: Self-pay | Admitting: Family Medicine

## 2016-09-23 ENCOUNTER — Encounter: Payer: Self-pay | Admitting: Family Medicine

## 2016-09-24 ENCOUNTER — Ambulatory Visit (INDEPENDENT_AMBULATORY_CARE_PROVIDER_SITE_OTHER): Payer: BLUE CROSS/BLUE SHIELD | Admitting: Family Medicine

## 2016-09-24 ENCOUNTER — Encounter: Payer: Self-pay | Admitting: Family Medicine

## 2016-09-24 DIAGNOSIS — M5137 Other intervertebral disc degeneration, lumbosacral region: Secondary | ICD-10-CM | POA: Insufficient documentation

## 2016-09-24 DIAGNOSIS — M51379 Other intervertebral disc degeneration, lumbosacral region without mention of lumbar back pain or lower extremity pain: Secondary | ICD-10-CM | POA: Insufficient documentation

## 2016-09-24 MED ORDER — PREDNISONE 20 MG PO TABS: 40.0000 mg | ORAL_TABLET | Freq: Every day | ORAL | 0 refills | Status: AC

## 2016-09-24 NOTE — Patient Instructions (Signed)
Back Exercises Introduction If you have pain in your back, do these exercises 2-3 times each day or as told by your doctor. When the pain goes away, do the exercises once each day, but repeat the steps more times for each exercise (do more repetitions). If you do not have pain in your back, do these exercises once each day or as told by your doctor. Exercises Single Knee to Chest  Do these steps 3-5 times in a row for each leg: 1. Lie on your back on a firm bed or the floor with your legs stretched out. 2. Bring one knee to your chest. 3. Hold your knee to your chest by grabbing your knee or thigh. 4. Pull on your knee until you feel a gentle stretch in your lower back. 5. Keep doing the stretch for 10-30 seconds. 6. Slowly let go of your leg and straighten it. Pelvic Tilt  Do these steps 5-10 times in a row: 1. Lie on your back on a firm bed or the floor with your legs stretched out. 2. Bend your knees so they point up to the ceiling. Your feet should be flat on the floor. 3. Tighten your lower belly (abdomen) muscles to press your lower back against the floor. This will make your tailbone point up to the ceiling instead of pointing down to your feet or the floor. 4. Stay in this position for 5-10 seconds while you gently tighten your muscles and breathe evenly. Cat-Cow  Do these steps until your lower back bends more easily: 1. Get on your hands and knees on a firm surface. Keep your hands under your shoulders, and keep your knees under your hips. You may put padding under your knees. 2. Let your head hang down, and make your tailbone point down to the floor so your lower back is round like the back of a cat. 3. Stay in this position for 5 seconds. 4. Slowly lift your head and make your tailbone point up to the ceiling so your back hangs low (sags) like the back of a cow. 5. Stay in this position for 5 seconds. Press-Ups  Do these steps 5-10 times in a row: 1. Lie on your belly  (face-down) on the floor. 2. Place your hands near your head, about shoulder-width apart. 3. While you keep your back relaxed and keep your hips on the floor, slowly straighten your arms to raise the top half of your body and lift your shoulders. Do not use your back muscles. To make yourself more comfortable, you may change where you place your hands. 4. Stay in this position for 5 seconds. 5. Slowly return to lying flat on the floor. Bridges  Do these steps 10 times in a row: 1. Lie on your back on a firm surface. 2. Bend your knees so they point up to the ceiling. Your feet should be flat on the floor. 3. Tighten your butt muscles and lift your butt off of the floor until your waist is almost as high as your knees. If you do not feel the muscles working in your butt and the back of your thighs, slide your feet 1-2 inches farther away from your butt. 4. Stay in this position for 3-5 seconds. 5. Slowly lower your butt to the floor, and let your butt muscles relax. If this exercise is too easy, try doing it with your arms crossed over your chest. Belly Crunches  Do these steps 5-10 times in a row: 1. Lie   on your back on a firm bed or the floor with your legs stretched out. 2. Bend your knees so they point up to the ceiling. Your feet should be flat on the floor. 3. Cross your arms over your chest. 4. Tip your chin a little bit toward your chest but do not bend your neck. 5. Tighten your belly muscles and slowly raise your chest just enough to lift your shoulder blades a tiny bit off of the floor. 6. Slowly lower your chest and your head to the floor. Back Lifts  Do these steps 5-10 times in a row: 1. Lie on your belly (face-down) with your arms at your sides, and rest your forehead on the floor. 2. Tighten the muscles in your legs and your butt. 3. Slowly lift your chest off of the floor while you keep your hips on the floor. Keep the back of your head in line with the curve in your back.  Look at the floor while you do this. 4. Stay in this position for 3-5 seconds. 5. Slowly lower your chest and your face to the floor. Contact a doctor if:  Your back pain gets a lot worse when you do an exercise.  Your back pain does not lessen 2 hours after you exercise. If you have any of these problems, stop doing the exercises. Do not do them again unless your doctor says it is okay. Get help right away if:  You have sudden, very bad back pain. If this happens, stop doing the exercises. Do not do them again unless your doctor says it is okay. This information is not intended to replace advice given to you by your health care provider. Make sure you discuss any questions you have with your health care provider. Document Released: 09/20/2010 Document Revised: 01/24/2016 Document Reviewed: 10/12/2014  2017 Elsevier  

## 2016-09-24 NOTE — Progress Notes (Signed)
Pre visit review using our clinic review tool, if applicable. No additional management support is needed unless otherwise documented below in the visit note. 

## 2016-09-24 NOTE — Assessment & Plan Note (Signed)
With persistent radiculopathy. Refer to spine specialist, likely would benefit from further imaging.   NSAIDs do help but pain returns as soon as they wear off. Repeat course of prednisone. Exercises given to pt and discussed- see AVS.

## 2016-09-24 NOTE — Progress Notes (Signed)
Subjective:   Patient ID: Tanya Carr, female    DOB: Nov 15, 1970, 46 y.o.   MRN: [id number]  Tanya Carr is a pleasant 46 y.o. year old female who presents to clinic today with Back Pain (Discuss xray results and options)  on 09/24/2016  HPI:  I saw her on 09/04/16 for back pain with left sided radiculopathy.  Notes reviewed.  Placed on prednsione taper for 7 days and ordered an xray.  Xray showed degenerative disc discuss at L5- S1:  CLINICAL DATA:  Low back pain and left lower extremity sciatica.  EXAM: LUMBAR SPINE - COMPLETE 4+ VIEW  COMPARISON:  None.  FINDINGS: There is evidence of degenerative disc disease with disc space narrowing and facet hypertrophy at the L5-S1 level. No fracture or subluxation. No other significant disc space narrowing. No bony lesions identified.  IMPRESSION: Degenerative disc disease at L5-S1.  Symptoms improved with the prednisone but now have returned.  She feels she cannot afford PT at this time- copay is $100 a visit with her current insurance.  Current Outpatient Prescriptions on File Prior to Visit  Medication Sig Dispense Refill  . amLODipine (NORVASC) 5 MG tablet TAKE 1 TABLET (5 MG TOTAL) BY MOUTH DAILY. 90 tablet 1  . lisinopril (PRINIVIL,ZESTRIL) 30 MG tablet TAKE 1 TABLET (30 MG TOTAL) BY MOUTH DAILY. 90 tablet 3   No current facility-administered medications on file prior to visit.     Allergies  Allergen Reactions  . Food Hives    Seafood allergy: hives and itching    Past Medical History:  Diagnosis Date  . Fainting episodes 2011   history of with blood draw, mammogram, post op 2011  . Headache(784.0)    history of migraines  . Hypertension    on lisinopril-will do ekg today  . PONV (postoperative nausea and vomiting)    vagal response per pt    Past Surgical History:  Procedure Laterality Date  . ABDOMINAL HYSTERECTOMY    . ulnar nerve decompression  2011    Family History  Problem  Relation Age of Onset  . Cancer Mother   . Multiple sclerosis Mother   . COPD Father   . Pulmonary embolism Brother     Social History   Social History  . Marital status: Single    Spouse name: N/A  . Number of children: N/A  . Years of education: N/A   Occupational History  . Not on file.   Social History Main Topics  . Smoking status: Former Smoker    Quit date: 11/16/1996  . Smokeless tobacco: Never Used  . Alcohol use Yes     Comment: rare  . Drug use: No  . Sexual activity: Not on file   Other Topics Concern  . Not on file   Social History Narrative   G0   Travel agent   The PMH, PSH, Social History, Family History, Medications, and allergies have been reviewed in [facility], and have been updated if relevant.     Review of Systems  Constitutional: Negative.   Musculoskeletal: Positive for back pain.  Neurological: Positive for numbness. Negative for dizziness, tremors, seizures, syncope, facial asymmetry, speech difficulty, weakness, light-headedness and headaches.  All other systems reviewed and are negative.      Objective:    BP 118/80 (BP Location: Left Arm, Patient Position: Sitting, Cuff Size: Normal)   Pulse 66   Temp 97.5 F (36.4 C) (Oral)   Wt 200 lb 12 oz (91.1 kg)  LMP 11/10/2011   SpO2 98%   BMI 34.46 kg/m    Physical Exam   OBJECTIVE: BP 118/80 (BP Location: Left Arm, Patient Position: Sitting, Cuff Size: Normal)   Pulse 66   Temp 97.5 F (36.4 C) (Oral)   Wt 200 lb 12 oz (91.1 kg)   LMP 11/10/2011   SpO2 98%   BMI 34.46 kg/m   Patient appears to be in mild to moderate pain, antalgic gait noted. Lumbosacral spine area reveals no local tenderness or mass.  Painful and reduced LS ROM noted. Straight leg raise is positive at 45 degrees on left. DTR's, motor strength and sensation normal, including heel and toe gait.  Peripheral pulses are palpable.         Assessment & Plan:   DDD (degenerative disc disease), lumbosacral No  Follow-up on file.

## 2016-09-30 ENCOUNTER — Other Ambulatory Visit: Payer: Self-pay | Admitting: Family Medicine

## 2017-02-06 DIAGNOSIS — M5137 Other intervertebral disc degeneration, lumbosacral region: Secondary | ICD-10-CM | POA: Diagnosis not present

## 2017-02-06 DIAGNOSIS — M5432 Sciatica, left side: Secondary | ICD-10-CM | POA: Diagnosis not present

## 2017-02-06 DIAGNOSIS — M545 Low back pain: Secondary | ICD-10-CM | POA: Diagnosis not present

## 2017-02-10 DIAGNOSIS — M5116 Intervertebral disc disorders with radiculopathy, lumbar region: Secondary | ICD-10-CM | POA: Diagnosis not present

## 2017-02-10 DIAGNOSIS — M4726 Other spondylosis with radiculopathy, lumbar region: Secondary | ICD-10-CM | POA: Diagnosis not present

## 2017-02-10 DIAGNOSIS — M5137 Other intervertebral disc degeneration, lumbosacral region: Secondary | ICD-10-CM | POA: Diagnosis not present

## 2017-03-03 DIAGNOSIS — M5417 Radiculopathy, lumbosacral region: Secondary | ICD-10-CM | POA: Diagnosis not present

## 2017-03-03 DIAGNOSIS — M5416 Radiculopathy, lumbar region: Secondary | ICD-10-CM | POA: Diagnosis not present

## 2017-03-03 DIAGNOSIS — M4726 Other spondylosis with radiculopathy, lumbar region: Secondary | ICD-10-CM | POA: Diagnosis not present

## 2017-03-05 ENCOUNTER — Telehealth: Payer: Self-pay | Admitting: Family Medicine

## 2017-03-05 DIAGNOSIS — M545 Low back pain: Secondary | ICD-10-CM | POA: Diagnosis not present

## 2017-03-05 DIAGNOSIS — Z4689 Encounter for fitting and adjustment of other specified devices: Secondary | ICD-10-CM | POA: Diagnosis not present

## 2017-03-05 DIAGNOSIS — M4726 Other spondylosis with radiculopathy, lumbar region: Secondary | ICD-10-CM | POA: Diagnosis not present

## 2017-03-05 DIAGNOSIS — M5432 Sciatica, left side: Secondary | ICD-10-CM | POA: Diagnosis not present

## 2017-03-05 DIAGNOSIS — M5116 Intervertebral disc disorders with radiculopathy, lumbar region: Secondary | ICD-10-CM | POA: Diagnosis not present

## 2017-03-05 NOTE — Telephone Encounter (Signed)
No if they fax EKG, I am comfortable clearing her without an appointment.

## 2017-03-05 NOTE — Telephone Encounter (Signed)
I do not see the form but typically and EKG is required.  Would she be able to come in to see one of my partners tomorrow?

## 2017-03-05 NOTE — Telephone Encounter (Signed)
Yes I can see her on Monday.

## 2017-03-05 NOTE — Telephone Encounter (Signed)
Patient said if they do her ekg tomorrow, she'll call to cancel her appointment on Monday.  She said it's very important that the form is faxed back to Dr.Torrealba's office as soon as possible on Monday. I received the form from Dr.Torrealba's office and will put it in Dr.Aron's in box.

## 2017-03-05 NOTE — Telephone Encounter (Signed)
The schedule is full on tomorrow.  Do you want to see her on Monday?

## 2017-03-05 NOTE — Telephone Encounter (Signed)
Patient scheduled appointment with Dr.Aron on Monday at 11:30.  Patient is going for pre-op tomorrow at [facility].  Patient will have them fax ekg to Dr.Aron.  Patient will ask Dr.Torrealba's office to fax clearance form again.  I gave her my fax number to send  the ekg and form. Patient asked if they send the ekg, does she still need the appointment on Monday?

## 2017-03-05 NOTE — Telephone Encounter (Signed)
Patient called.  Patient is having surgery on 03/11/17 by Dr.Rubin Torrealba for back surgery.  Dr.Torrealba's office sent a clearance for surgery on 03/03/17.  Patient said they need the form sent back to them a.s.a.p.. Please call patient back to let patient know when form is sent.

## 2017-03-06 ENCOUNTER — Other Ambulatory Visit: Payer: Self-pay | Admitting: Orthopedic Surgery

## 2017-03-06 DIAGNOSIS — M4726 Other spondylosis with radiculopathy, lumbar region: Secondary | ICD-10-CM

## 2017-03-06 DIAGNOSIS — M5116 Intervertebral disc disorders with radiculopathy, lumbar region: Secondary | ICD-10-CM | POA: Diagnosis not present

## 2017-03-06 DIAGNOSIS — F419 Anxiety disorder, unspecified: Secondary | ICD-10-CM | POA: Insufficient documentation

## 2017-03-06 DIAGNOSIS — Z01818 Encounter for other preprocedural examination: Secondary | ICD-10-CM | POA: Diagnosis not present

## 2017-03-09 ENCOUNTER — Ambulatory Visit (INDEPENDENT_AMBULATORY_CARE_PROVIDER_SITE_OTHER): Payer: Commercial Managed Care - PPO | Admitting: Family Medicine

## 2017-03-09 ENCOUNTER — Encounter: Payer: Self-pay | Admitting: Family Medicine

## 2017-03-09 DIAGNOSIS — Z01818 Encounter for other preprocedural examination: Secondary | ICD-10-CM | POA: Diagnosis not present

## 2017-03-09 NOTE — Telephone Encounter (Signed)
Patient seen today for pre op visit, EKG performed and surgical clearance faxed back as requested.

## 2017-03-09 NOTE — Progress Notes (Signed)
Subjective:    Tanya Carr is a 46 y.o. female who presents to the office today for a preoperative consultation at the request of surgeon Dr. Ivan Croft who plans on performing removal of spinal lamina on July 11. Planned anesthesia: general. The patient has the following known anesthesia issues: none.  Patient does not have objections to receiving blood products if needed.   Current Outpatient Prescriptions on File Prior to Visit  Medication Sig Dispense Refill  . amLODipine (NORVASC) 5 MG tablet TAKE 1 TABLET BY MOUTH DAILY. 90 tablet 2  . lisinopril (PRINIVIL,ZESTRIL) 30 MG tablet TAKE 1 TABLET (30 MG TOTAL) BY MOUTH DAILY. 90 tablet 3   No current facility-administered medications on file prior to visit.     Allergies  Allergen Reactions  . Food Hives    Seafood allergy: hives and itching    Past Medical History:  Diagnosis Date  . Fainting episodes 2011   history of with blood draw, mammogram, post op 2011  . Headache(784.0)    history of migraines  . Hypertension    on lisinopril-will do ekg today  . PONV (postoperative nausea and vomiting)    vagal response per pt    Past Surgical History:  Procedure Laterality Date  . ABDOMINAL HYSTERECTOMY    . ulnar nerve decompression  2011    Family History  Problem Relation Age of Onset  . Cancer Mother   . Multiple sclerosis Mother   . COPD Father   . Pulmonary embolism Brother     Social History   Social History  . Marital status: Single    Spouse name: N/A  . Number of children: N/A  . Years of education: N/A   Occupational History  . Not on file.   Social History Main Topics  . Smoking status: Former Smoker    Quit date: 11/16/1996  . Smokeless tobacco: Never Used  . Alcohol use Yes     Comment: rare  . Drug use: No  . Sexual activity: Not on file   Other Topics Concern  . Not on file   Social History Narrative   G0   Travel agent   The PMH, PSH, Social History, Family History,  Medications, and allergies have been reviewed in [facility], and have been updated if relevant.  Review of Systems Pertinent items are noted in HPI.    Objective:    BP 118/72 (BP Location: Left Arm, Patient Position: Sitting, Cuff Size: Normal)   Pulse 90   Ht 5\' 4"  (1.626 m)   Wt 203 lb (92.1 kg)   LMP 11/10/2011   SpO2 98%   BMI 34.84 kg/m   General Appearance:    Alert, cooperative, no distress, appears stated age  Head:    Normocephalic, without obvious abnormality, atraumatic  Eyes:    PERRL, conjunctiva/corneas clear, EOM's intact, fundi    benign, both eyes  Ears:    Normal TM's and external ear canals, both ears  Nose:   Nares normal, septum midline, mucosa normal, no drainage    or sinus tenderness  Throat:   Lips, mucosa, and tongue normal; teeth and gums normal  Neck:   Supple, symmetrical, trachea midline, no adenopathy;    thyroid:  no enlargement/tenderness/nodules; no carotid   bruit or JVD  Back:     Symmetric, no curvature, ROM normal, no CVA tenderness  Lungs:     Clear to auscultation bilaterally, respirations unlabored  Chest Wall:    No tenderness or deformity  Heart:    Regular rate and rhythm, S1 and S2 normal, no murmur, rub   or gallop  Extremities:   Extremities normal, atraumatic, no cyanosis or edema  Pulses:   2+ and symmetric all extremities  Skin:   Skin color, texture, turgor normal, no rashes or lesions  Lymph nodes:   Cervical, supraclavicular, and axillary nodes normal  Neurologic:   CNII-XII intact, normal strength, sensation and reflexes    throughout      Cardiographics ECG: normal sinus rhythm, no blocks or conduction defects, no ischemic changes   Lab Review  No visits with results within 6 Month(s) from this visit.  Latest known visit with results is:  Office Visit on 08/19/2016  Component Date Value  . WBC 08/19/2016 6.4   . RBC 08/19/2016 5.11   . Hemoglobin 08/19/2016 13.8   . HCT 08/19/2016 41.6   . MCV 08/19/2016 81.4   .  MCHC 08/19/2016 33.2   . RDW 08/19/2016 14.0   . Platelets 08/19/2016 244.0   . Neutrophils Relative % 08/19/2016 65.8   . Lymphocytes Relative 08/19/2016 21.5   . Monocytes Relative 08/19/2016 7.0   . Eosinophils Relative 08/19/2016 5.2*  . Basophils Relative 08/19/2016 0.5   . Neutro Abs 08/19/2016 4.2   . Lymphs Abs 08/19/2016 1.4   . Monocytes Absolute 08/19/2016 0.4   . Eosinophils Absolute 08/19/2016 0.3   . Basophils Absolute 08/19/2016 0.0   . Sodium 08/19/2016 139   . Potassium 08/19/2016 4.2   . Chloride 08/19/2016 104   . CO2 08/19/2016 30   . Glucose, Bld 08/19/2016 93   . BUN 08/19/2016 20   . Creatinine, Ser 08/19/2016 0.85   . Total Bilirubin 08/19/2016 0.3   . Alkaline Phosphatase 08/19/2016 38*  . AST 08/19/2016 16   . ALT 08/19/2016 16   . Total Protein 08/19/2016 6.7   . Albumin 08/19/2016 4.0   . Calcium 08/19/2016 8.9   . GFR 08/19/2016 76.13   . Cholesterol 08/19/2016 168   . Triglycerides 08/19/2016 61.0   . HDL 08/19/2016 54.50   . VLDL 08/19/2016 12.2   . LDL Cholesterol 08/19/2016 102*  . Total CHOL/HDL Ratio 08/19/2016 3   . NonHDL 08/19/2016 113.72   . TSH 08/19/2016 3.01       Assessment:      46 y.o. female with planned surgery as above.   Known risk factors for perioperative complications: None   Difficulty with intubation is not anticipated.  Cardiac Risk Estimation: low    Plan:   Medically cleared for surgery Form fillwed out and faxed back to Dr. Sherlyn Lick.

## 2017-03-11 DIAGNOSIS — M5116 Intervertebral disc disorders with radiculopathy, lumbar region: Secondary | ICD-10-CM | POA: Diagnosis not present

## 2017-03-11 DIAGNOSIS — Z87891 Personal history of nicotine dependence: Secondary | ICD-10-CM | POA: Diagnosis not present

## 2017-03-11 DIAGNOSIS — M5410 Radiculopathy, site unspecified: Secondary | ICD-10-CM | POA: Diagnosis not present

## 2017-03-11 DIAGNOSIS — M4726 Other spondylosis with radiculopathy, lumbar region: Secondary | ICD-10-CM | POA: Diagnosis not present

## 2017-03-11 DIAGNOSIS — M5432 Sciatica, left side: Secondary | ICD-10-CM | POA: Diagnosis not present

## 2017-03-11 DIAGNOSIS — M5117 Intervertebral disc disorders with radiculopathy, lumbosacral region: Secondary | ICD-10-CM | POA: Diagnosis not present

## 2017-03-12 DIAGNOSIS — Z87891 Personal history of nicotine dependence: Secondary | ICD-10-CM | POA: Diagnosis not present

## 2017-03-12 DIAGNOSIS — M4726 Other spondylosis with radiculopathy, lumbar region: Secondary | ICD-10-CM | POA: Diagnosis not present

## 2017-03-12 DIAGNOSIS — M5117 Intervertebral disc disorders with radiculopathy, lumbosacral region: Secondary | ICD-10-CM | POA: Diagnosis not present

## 2017-04-09 DIAGNOSIS — M545 Low back pain: Secondary | ICD-10-CM | POA: Diagnosis not present

## 2017-05-25 DIAGNOSIS — M6281 Muscle weakness (generalized): Secondary | ICD-10-CM | POA: Diagnosis not present

## 2017-05-25 DIAGNOSIS — Z9889 Other specified postprocedural states: Secondary | ICD-10-CM | POA: Diagnosis not present

## 2017-05-28 DIAGNOSIS — Z9889 Other specified postprocedural states: Secondary | ICD-10-CM | POA: Diagnosis not present

## 2017-05-28 DIAGNOSIS — M6281 Muscle weakness (generalized): Secondary | ICD-10-CM | POA: Diagnosis not present

## 2017-05-29 DIAGNOSIS — Z9889 Other specified postprocedural states: Secondary | ICD-10-CM | POA: Diagnosis not present

## 2017-05-29 DIAGNOSIS — M6281 Muscle weakness (generalized): Secondary | ICD-10-CM | POA: Diagnosis not present

## 2017-06-01 DIAGNOSIS — Z9889 Other specified postprocedural states: Secondary | ICD-10-CM | POA: Diagnosis not present

## 2017-06-01 DIAGNOSIS — M6281 Muscle weakness (generalized): Secondary | ICD-10-CM | POA: Diagnosis not present

## 2017-06-03 DIAGNOSIS — M6281 Muscle weakness (generalized): Secondary | ICD-10-CM | POA: Diagnosis not present

## 2017-06-03 DIAGNOSIS — Z9889 Other specified postprocedural states: Secondary | ICD-10-CM | POA: Diagnosis not present

## 2017-06-04 DIAGNOSIS — M6281 Muscle weakness (generalized): Secondary | ICD-10-CM | POA: Diagnosis not present

## 2017-06-04 DIAGNOSIS — Z9889 Other specified postprocedural states: Secondary | ICD-10-CM | POA: Diagnosis not present

## 2017-06-10 DIAGNOSIS — M6281 Muscle weakness (generalized): Secondary | ICD-10-CM | POA: Diagnosis not present

## 2017-06-10 DIAGNOSIS — Z9889 Other specified postprocedural states: Secondary | ICD-10-CM | POA: Diagnosis not present

## 2017-06-15 DIAGNOSIS — Z9889 Other specified postprocedural states: Secondary | ICD-10-CM | POA: Diagnosis not present

## 2017-06-15 DIAGNOSIS — M6281 Muscle weakness (generalized): Secondary | ICD-10-CM | POA: Diagnosis not present

## 2017-06-17 DIAGNOSIS — Z9889 Other specified postprocedural states: Secondary | ICD-10-CM | POA: Diagnosis not present

## 2017-06-17 DIAGNOSIS — M6281 Muscle weakness (generalized): Secondary | ICD-10-CM | POA: Diagnosis not present

## 2017-06-18 DIAGNOSIS — M6281 Muscle weakness (generalized): Secondary | ICD-10-CM | POA: Diagnosis not present

## 2017-06-18 DIAGNOSIS — Z9889 Other specified postprocedural states: Secondary | ICD-10-CM | POA: Diagnosis not present

## 2017-06-22 DIAGNOSIS — Z9889 Other specified postprocedural states: Secondary | ICD-10-CM | POA: Diagnosis not present

## 2017-06-22 DIAGNOSIS — M6281 Muscle weakness (generalized): Secondary | ICD-10-CM | POA: Diagnosis not present

## 2017-06-24 DIAGNOSIS — Z9889 Other specified postprocedural states: Secondary | ICD-10-CM | POA: Diagnosis not present

## 2017-06-24 DIAGNOSIS — M6281 Muscle weakness (generalized): Secondary | ICD-10-CM | POA: Diagnosis not present

## 2017-06-25 DIAGNOSIS — Z9889 Other specified postprocedural states: Secondary | ICD-10-CM | POA: Diagnosis not present

## 2017-06-25 DIAGNOSIS — M6281 Muscle weakness (generalized): Secondary | ICD-10-CM | POA: Diagnosis not present

## 2017-06-29 DIAGNOSIS — Z9889 Other specified postprocedural states: Secondary | ICD-10-CM | POA: Diagnosis not present

## 2017-06-29 DIAGNOSIS — M6281 Muscle weakness (generalized): Secondary | ICD-10-CM | POA: Diagnosis not present

## 2017-07-01 DIAGNOSIS — Z9889 Other specified postprocedural states: Secondary | ICD-10-CM | POA: Diagnosis not present

## 2017-07-01 DIAGNOSIS — M6281 Muscle weakness (generalized): Secondary | ICD-10-CM | POA: Diagnosis not present

## 2017-07-03 DIAGNOSIS — M5116 Intervertebral disc disorders with radiculopathy, lumbar region: Secondary | ICD-10-CM | POA: Diagnosis not present

## 2017-07-03 DIAGNOSIS — M4726 Other spondylosis with radiculopathy, lumbar region: Secondary | ICD-10-CM | POA: Diagnosis not present

## 2017-07-03 DIAGNOSIS — M5432 Sciatica, left side: Secondary | ICD-10-CM | POA: Diagnosis not present

## 2017-07-20 DIAGNOSIS — M5432 Sciatica, left side: Secondary | ICD-10-CM | POA: Diagnosis not present

## 2017-09-14 ENCOUNTER — Other Ambulatory Visit: Payer: Self-pay | Admitting: Family Medicine

## 2017-09-14 DIAGNOSIS — Z139 Encounter for screening, unspecified: Secondary | ICD-10-CM

## 2017-09-28 ENCOUNTER — Other Ambulatory Visit: Payer: Self-pay | Admitting: Family Medicine

## 2017-10-20 ENCOUNTER — Ambulatory Visit
Admission: RE | Admit: 2017-10-20 | Discharge: 2017-10-20 | Disposition: A | Discharge status: Home / Self Care | Payer: Commercial Managed Care - PPO | Source: Ambulatory Visit | Attending: Family Medicine | Admitting: Family Medicine

## 2017-10-20 ENCOUNTER — Encounter: Payer: Self-pay | Admitting: Family Medicine

## 2017-10-20 ENCOUNTER — Ambulatory Visit (INDEPENDENT_AMBULATORY_CARE_PROVIDER_SITE_OTHER): Payer: Commercial Managed Care - PPO | Admitting: Family Medicine

## 2017-10-20 VITALS — BP 116/80 | HR 63 | Temp 97.5°F | Ht 64.25 in | Wt 206.2 lb

## 2017-10-20 DIAGNOSIS — I10 Essential (primary) hypertension: Secondary | ICD-10-CM | POA: Diagnosis not present

## 2017-10-20 DIAGNOSIS — R5383 Other fatigue: Secondary | ICD-10-CM | POA: Diagnosis not present

## 2017-10-20 DIAGNOSIS — Z139 Encounter for screening, unspecified: Secondary | ICD-10-CM

## 2017-10-20 DIAGNOSIS — Z1231 Encounter for screening mammogram for malignant neoplasm of breast: Secondary | ICD-10-CM | POA: Diagnosis not present

## 2017-10-20 DIAGNOSIS — Z0000 Encounter for general adult medical examination without abnormal findings: Secondary | ICD-10-CM | POA: Insufficient documentation

## 2017-10-20 LAB — CBC
HCT: 42.1 % (ref 36.0–46.0)
Hemoglobin: 13.7 g/dL (ref 12.0–15.0)
MCHC: 32.4 g/dL (ref 30.0–36.0)
MCV: 83.1 fl (ref 78.0–100.0)
Platelets: 211 10*3/uL (ref 150.0–400.0)
RBC: 5.07 Mil/uL (ref 3.87–5.11)
RDW: 15 % (ref 11.5–15.5)
WBC: 7.4 10*3/uL (ref 4.0–10.5)

## 2017-10-20 LAB — LIPID PANEL
CHOL/HDL RATIO: 3
Cholesterol: 152 mg/dL (ref 0–200)
HDL: 46.7 mg/dL (ref >39.00)
LDL Cholesterol: 87 mg/dL (ref 0–99)
NONHDL: 105.6
Triglycerides: 92 mg/dL (ref 0.0–149.0)
VLDL: 18.4 mg/dL (ref 0.0–40.0)

## 2017-10-20 LAB — COMPREHENSIVE METABOLIC PANEL
ALK PHOS: 42 U/L (ref 39–117)
ALT: 14 U/L (ref 0–35)
AST: 15 U/L (ref 0–37)
Albumin: 4 g/dL (ref 3.5–5.2)
BILIRUBIN TOTAL: 0.5 mg/dL (ref 0.2–1.2)
BUN: 14 mg/dL (ref 6–23)
CO2: 28 meq/L (ref 19–32)
Calcium: 8.9 mg/dL (ref 8.4–10.5)
Chloride: 102 mEq/L (ref 96–112)
Creatinine, Ser: 0.76 mg/dL (ref 0.40–1.20)
GFR: 86.2 mL/min (ref >60.00)
GLUCOSE: 96 mg/dL (ref 70–99)
Potassium: 4 mEq/L (ref 3.5–5.1)
SODIUM: 136 meq/L (ref 135–145)
TOTAL PROTEIN: 6.8 g/dL (ref 6.0–8.3)

## 2017-10-20 LAB — TSH: TSH: 3.31 u[IU]/mL (ref 0.35–4.50)

## 2017-10-20 MED ORDER — PREGABALIN 75 MG PO CAPS: 75.0000 mg | ORAL_CAPSULE | Freq: Two times a day (BID) | ORAL | 2 refills | Status: DC

## 2017-10-20 MED ORDER — TIZANIDINE HCL 4 MG PO TABS: 4.0000 mg | ORAL_TABLET | Freq: Three times a day (TID) | ORAL | 3 refills | Status: DC

## 2017-10-20 NOTE — Patient Instructions (Signed)
Great to see you. I will call you with your lab results from today and you can view them online.   

## 2017-10-20 NOTE — Assessment & Plan Note (Signed)
Reviewed preventive care protocols, scheduled due services, and updated immunizations Discussed nutrition, exercise, diet, and healthy lifestyle.  

## 2017-10-20 NOTE — Progress Notes (Signed)
Subjective:   Patient ID: Tanya Carr, female    DOB: 1971/06/19, 47 y.o.   MRN: [id number]  Tanya Carr is a pleasant 47 y.o. year old female who presents to clinic today with Annual Exam (Patient is here today for a CPE without PAP.  She is currently fasting. Patient would like for you to take over Lyrica.)  on 10/20/2017  HPI:  Remote h/o hysterectomy. Does still have ovaries.  Mammogram 08/15/16, has one scheduled for today.  HTN- has been well controlled with Norvasc 5 mg daily and Lisinopril 30 mg daily. Denies HA, blurred vision, CP, SOB or LE edema. Lab Results  Component Value Date   CREATININE 0.85 08/19/2016   Lab Results  Component Value Date   CHOL 168 08/19/2016   HDL 54.50 08/19/2016   LDLCALC 102 (H) 08/19/2016   TRIG 61.0 08/19/2016   CHOLHDL 3 08/19/2016   Lab Results  Component Value Date   ALT 16 08/19/2016   AST 16 08/19/2016   ALKPHOS 38 (L) 08/19/2016   BILITOT 0.3 08/19/2016   Lab Results  Component Value Date   TSH 3.01 08/19/2016   Lab Results  Component Value Date   WBC 6.4 08/19/2016   HGB 13.8 08/19/2016   HCT 41.6 08/19/2016   MCV 81.4 08/19/2016   PLT 244.0 08/19/2016   Current Outpatient Medications on File Prior to Visit  Medication Sig Dispense Refill  . amLODipine (NORVASC) 5 MG tablet TAKE 1 TABLET BY MOUTH DAILY. 90 tablet 0  . lidocaine (PF) 30 mL, bupivacaine 30 mL, sodium bicarbonate 5 mL Take 1 tablet by mouth daily.    Marland Kitchen lisinopril (PRINIVIL,ZESTRIL) 30 MG tablet TAKE 1 TABLET (30 MG TOTAL) BY MOUTH DAILY. 90 tablet 3  . tiZANidine (ZANAFLEX) 4 MG tablet Take 4 mg by mouth 3 (three) times daily.     No current facility-administered medications on file prior to visit.     Allergies  Allergen Reactions  . Bee Pollen   . Food Hives    Seafood allergy: hives and itching  . Shellfish Allergy     Past Medical History:  Diagnosis Date  . Fainting episodes 2011   history of with blood draw, mammogram,  post op 2011  . Headache(784.0)    history of migraines  . Hypertension    on lisinopril-will do ekg today  . PONV (postoperative nausea and vomiting)    vagal response per pt    Past Surgical History:  Procedure Laterality Date  . ABDOMINAL HYSTERECTOMY    . ulnar nerve decompression  2011    Family History  Problem Relation Age of Onset  . Cancer Mother   . Multiple sclerosis Mother   . COPD Father   . Pulmonary embolism Brother     Social History   Socioeconomic History  . Marital status: Single    Spouse name: Not on file  . Number of children: Not on file  . Years of education: Not on file  . Highest education level: Not on file  Social Needs  . Financial resource strain: Not on file  . Food insecurity - worry: Not on file  . Food insecurity - inability: Not on file  . Transportation needs - medical: Not on file  . Transportation needs - non-medical: Not on file  Occupational History  . Not on file  Tobacco Use  . Smoking status: Former Smoker    Last attempt to quit: 11/16/1996    Years since quitting:  20.9  . Smokeless tobacco: Never Used  Substance and Sexual Activity  . Alcohol use: Yes    Comment: rare  . Drug use: No  . Sexual activity: Not on file  Other Topics Concern  . Not on file  Social History Narrative   G0   Travel agent   The PMH, PSH, Social History, Family History, Medications, and allergies have been reviewed in [facility], and have been updated if relevant.  Review of Systems  Constitutional: Negative.   HENT: Negative.   Eyes: Negative.   Respiratory: Negative.   Cardiovascular: Negative.   Gastrointestinal: Negative.   Endocrine: Negative.   Genitourinary: Negative.   Musculoskeletal: Negative.   Skin: Negative.   Allergic/Immunologic: Negative.   Neurological: Negative.   Psychiatric/Behavioral: Negative.   All other systems reviewed and are negative.      Objective:    BP 116/80 (BP Location: Left Arm, Patient Position:  Sitting, Cuff Size: Normal)   Pulse 63   Temp (!) 97.5 F (36.4 C) (Oral)   Ht 5' 4.25" (1.632 m)   Wt 206 lb 3.2 oz (93.5 kg)   LMP 11/10/2011   SpO2 99%   BMI 35.12 kg/m   BP Readings from Last 3 Encounters:  10/20/17 116/80  03/09/17 118/72  09/24/16 118/80    Physical Exam   General:  Well-developed,well-nourished,in no acute distress; alert,appropriate and cooperative throughout examination Head:  normocephalic and atraumatic.   Eyes:  vision grossly intact, PERRL Ears:  R ear normal and L ear normal externally, TMs clear bilaterally Nose:  no external deformity.   Mouth:  good dentition.   Neck:  No deformities, masses, or tenderness noted. Breasts:  No mass, nodules, thickening, tenderness, bulging, retraction, inflamation, nipple discharge or skin changes noted.   Lungs:  Normal respiratory effort, chest expands symmetrically. Lungs are clear to auscultation, no crackles or wheezes. Heart:  Normal rate and regular rhythm. S1 and S2 normal without gallop, murmur, click, rub or other extra sounds. Abdomen:  Bowel sounds positive,abdomen soft and non-tender without masses, organomegaly or hernias noted. Rectal:  no external abnormalities.   Genitalia:  Pelvic Exam:        External: normal female genitalia without lesions or masses        Vagina: normal without lesions or masses        Adnexa: normal bimanual exam without masses or fullness Msk:  No deformity or scoliosis noted of thoracic or lumbar spine.   Extremities:  No clubbing, cyanosis, edema, or deformity noted with normal full range of motion of all joints.   Neurologic:  alert & oriented X3 and gait normal.   Skin:  Intact without suspicious lesions or rashes Cervical Nodes:  No lymphadenopathy noted Axillary Nodes:  No palpable lymphadenopathy Psych:  Cognition and judgment appear intact. Alert and cooperative with normal attention span and concentration. No apparent delusions, illusions, hallucinations       Assessment & Plan:   Essential hypertension  Well woman exam without gynecological exam No Follow-up on file.

## 2017-10-20 NOTE — Addendum Note (Signed)
Addended by: Lucille Passy on: 10/20/2017 07:53 AM   Modules accepted: Orders

## 2017-10-20 NOTE — Assessment & Plan Note (Signed)
Well controlled.  No changes made. 

## 2017-10-26 ENCOUNTER — Encounter: Payer: Self-pay | Admitting: Family Medicine

## 2017-12-01 ENCOUNTER — Other Ambulatory Visit: Payer: Self-pay | Admitting: Family Medicine

## 2017-12-15 ENCOUNTER — Other Ambulatory Visit: Payer: Self-pay | Admitting: Family Medicine

## 2018-03-10 ENCOUNTER — Other Ambulatory Visit: Payer: Self-pay | Admitting: Family Medicine

## 2018-04-28 ENCOUNTER — Ambulatory Visit: Payer: Commercial Managed Care - PPO | Admitting: Family Medicine

## 2018-04-28 ENCOUNTER — Encounter: Payer: Self-pay | Admitting: Family Medicine

## 2018-04-28 VITALS — BP 126/88 | HR 69 | Temp 98.6°F | Ht 64.25 in | Wt 198.4 lb

## 2018-04-28 DIAGNOSIS — R0789 Other chest pain: Secondary | ICD-10-CM

## 2018-04-28 DIAGNOSIS — R5383 Other fatigue: Secondary | ICD-10-CM

## 2018-04-28 DIAGNOSIS — R42 Dizziness and giddiness: Secondary | ICD-10-CM | POA: Diagnosis not present

## 2018-04-28 DIAGNOSIS — I10 Essential (primary) hypertension: Secondary | ICD-10-CM | POA: Diagnosis not present

## 2018-04-28 LAB — COMPREHENSIVE METABOLIC PANEL
ALT: 13 U/L (ref 0–35)
AST: 17 U/L (ref 0–37)
Albumin: 4.3 g/dL (ref 3.5–5.2)
Alkaline Phosphatase: 47 U/L (ref 39–117)
BILIRUBIN TOTAL: 0.5 mg/dL (ref 0.2–1.2)
BUN: 18 mg/dL (ref 6–23)
CO2: 24 mEq/L (ref 19–32)
Calcium: 9.2 mg/dL (ref 8.4–10.5)
Chloride: 103 mEq/L (ref 96–112)
Creatinine, Ser: 0.81 mg/dL (ref 0.40–1.20)
GFR: 79.92 mL/min (ref >60.00)
GLUCOSE: 98 mg/dL (ref 70–99)
POTASSIUM: 4.2 meq/L (ref 3.5–5.1)
SODIUM: 138 meq/L (ref 135–145)
Total Protein: 7.2 g/dL (ref 6.0–8.3)

## 2018-04-28 LAB — LUTEINIZING HORMONE: LH: 4.66 m[IU]/mL

## 2018-04-28 LAB — VITAMIN D 25 HYDROXY (VIT D DEFICIENCY, FRACTURES): VITD: 26.34 ng/mL — AB (ref 30.00–100.00)

## 2018-04-28 LAB — TSH: TSH: 3.18 u[IU]/mL (ref 0.35–4.50)

## 2018-04-28 LAB — FOLLICLE STIMULATING HORMONE: FSH: 18.2 m[IU]/mL

## 2018-04-28 NOTE — Assessment & Plan Note (Addendum)
New- intermittent, atypical. EKG reviewed and reassuring- bradycardia within normal limits. But she is very active and having new onset exercise fatigue, DOE and heart fluttering which is concerning. Refer to cardiology for further evaluation- ? Exercise stress test and possible holter monitor. The patient indicates understanding of these issues and agrees with the plan.

## 2018-04-28 NOTE — Progress Notes (Signed)
Subjective:   Patient ID: Tanya Carr, female    DOB: 1971-03-24, 47 y.o.   MRN: [id number]  Tanya Carr is a pleasant 47 y.o. year old female who presents to clinic today with Hypertension (Patient is here today to recheck hypertension.  She brings in BP readings with her which are for the most part ok but she will get this fluttery feeling and pressure in her chest and her BP would be up.  She bends over to pet the cat during those times and she has to stand back up and take a few deep breaths.  Also states has dizziness when bending over with some lightheadedness.  )  on 04/28/2018  HPI:  HTN-  Here to check her blood pressure.  Brings her home log with her- most readings are within normal range ( 117- 130/ 78-83) when she feels fine.  But she has been getting an intermittent fluttery feeling and pressure in her chest and when she checks her BP during those episodes over the past two weeks and has had a few elevated readings- 154/86, 146/87, 146/90. She states she also has some dizziness with bending over.  She is compliant with her BP rxs- lisinopril 30 mg daily and norvasc 5 mg daily.  Has not taken her medications today.  Chest pressure is not worsened by exertion or relieved by rest. But she does feel fatigued and more SOB with exertion and bending over.   She is quite active- does [employer] at least 4-5 times per week.  She is really noticed more fatigue with exercise for the past two weeks.  A need to catch her breath more often.  Wt Readings from Last 3 Encounters:  04/28/18 198 lb 6.4 oz (90 kg)  10/20/17 206 lb 3.2 oz (93.5 kg)  03/09/17 203 lb (92.1 kg)     Current Outpatient Medications on File Prior to Visit  Medication Sig Dispense Refill  . amLODipine (NORVASC) 5 MG tablet TAKE 1 TABLET BY MOUTH DAILY. 90 tablet 2  . lisinopril (PRINIVIL,ZESTRIL) 30 MG tablet TAKE 1 TABLET BY MOUTH DAILY. 90 tablet 1  . tiZANidine (ZANAFLEX) 4 MG tablet TAKE 1  TABLET BY MOUTH 3 TIMES DAILY. 30 tablet 3  . pregabalin (LYRICA) 75 MG capsule Take 1 capsule (75 mg total) by mouth 2 (two) times daily. (Patient not taking: Reported on 04/28/2018) 60 capsule 2   No current facility-administered medications on file prior to visit.     Allergies  Allergen Reactions  . Bee Pollen   . Food Hives    Seafood allergy: hives and itching  . Shellfish Allergy     Past Medical History:  Diagnosis Date  . Fainting episodes 2011   history of with blood draw, mammogram, post op 2011  . Headache(784.0)    history of migraines  . Hypertension    on lisinopril-will do ekg today  . PONV (postoperative nausea and vomiting)    vagal response per pt    Past Surgical History:  Procedure Laterality Date  . ABDOMINAL HYSTERECTOMY    . ulnar nerve decompression  2011    Family History  Problem Relation Age of Onset  . Cancer Mother   . Multiple sclerosis Mother   . COPD Father   . Pulmonary embolism Brother     Social History   Socioeconomic History  . Marital status: Single    Spouse name: Not on file  . Number of children: Not on file  .  Years of education: Not on file  . Highest education level: Not on file  Occupational History  . Not on file  Social Needs  . Financial resource strain: Not on file  . Food insecurity:    Worry: Not on file    Inability: Not on file  . Transportation needs:    Medical: Not on file    Non-medical: Not on file  Tobacco Use  . Smoking status: Former Smoker    Last attempt to quit: 11/16/1996    Years since quitting: 21.4  . Smokeless tobacco: Never Used  Substance and Sexual Activity  . Alcohol use: Yes    Comment: rare  . Drug use: No  . Sexual activity: Not on file  Lifestyle  . Physical activity:    Days per week: Not on file    Minutes per session: Not on file  . Stress: Not on file  Relationships  . Social connections:    Talks on phone: Not on file    Gets together: Not on file    Attends  religious service: Not on file    Active member of club or organization: Not on file    Attends meetings of clubs or organizations: Not on file    Relationship status: Not on file  . Intimate partner violence:    Fear of current or ex partner: Not on file    Emotionally abused: Not on file    Physically abused: Not on file    Forced sexual activity: Not on file  Other Topics Concern  . Not on file  Social History Narrative   G0   Travel agent   The PMH, PSH, Social History, Family History, Medications, and allergies have been reviewed in [facility], and have been updated if relevant.  Review of Systems  Constitutional: Positive for fatigue.  HENT: Negative.   Eyes: Negative.   Respiratory: Positive for chest tightness and shortness of breath. Negative for apnea, cough, choking, wheezing and stridor.   Cardiovascular: Positive for chest pain and palpitations. Negative for leg swelling.  Endocrine: Negative.   Genitourinary: Negative.   Musculoskeletal: Negative.   Neurological: Positive for dizziness and light-headedness. Negative for tremors, seizures, syncope, facial asymmetry, speech difficulty, weakness, numbness and headaches.  Psychiatric/Behavioral: Negative.   All other systems reviewed and are negative.      Objective:    BP 126/88 (BP Location: Left Arm, Patient Position: Sitting, Cuff Size: Normal)   Pulse 69   Temp 98.6 F (37 C) (Oral)   Ht 5' 4.25" (1.632 m)   Wt 198 lb 6.4 oz (90 kg)   LMP 11/10/2011   SpO2 98%   BMI 33.79 kg/m    Physical Exam  Constitutional: She is oriented to person, place, and time. She appears well-developed and well-nourished. No distress.  HENT:  Head: Normocephalic and atraumatic.  Eyes: EOM are normal.  Neck: Normal range of motion.  Cardiovascular: Normal rate and regular rhythm.  Pulmonary/Chest: Effort normal and breath sounds normal.  Musculoskeletal: Normal range of motion. She exhibits no edema.  Neurological: She is alert  and oriented to person, place, and time. No cranial nerve deficit.  Skin: Skin is warm and dry. She is not diaphoretic.  Psychiatric: She has a normal mood and affect. Her behavior is normal. Judgment and thought content normal.  Nursing note and vitals reviewed.         Assessment & Plan:   Sensation of chest pressure - Plan: EKG 12-Lead  Essential  hypertension  Chest pressure - Plan: B Nat Peptide, Ambulatory referral to Cardiology  Dizziness - Plan: Ambulatory referral to Cardiology  Other fatigue - Plan: CBC with Differential/Platelet, TSH, Vitamin D (25 hydroxy), LH, FSH, Comprehensive metabolic panel No follow-ups on file.

## 2018-04-28 NOTE — Assessment & Plan Note (Signed)
BP looks great today and overall, readings at home look good. Discussed with pt that I do not want to increase her medication given how good her readings are, that could lead to hypotension and more dizziness (orthostasis). She will continue to monitor her BP at home and keep me updated with her symptoms.

## 2018-04-28 NOTE — Addendum Note (Signed)
Addended by: Lynnea Ferrier on: 04/28/2018 08:13 AM   Modules accepted: Orders

## 2018-04-28 NOTE — Assessment & Plan Note (Addendum)
New- intermittent- mainly when bending over. She was unable to reproduce that sensation today, no signs of vertigo on exam either.

## 2018-04-28 NOTE — Assessment & Plan Note (Signed)
See above under chest pressure- will check labs today for to rule out additional contributing factors. The patient indicates understanding of these issues and agrees with the plan. Orders Placed This Encounter  Procedures  . CBC with Differential/Platelet  . TSH  . Vitamin D (25 hydroxy)  . LH  . [place]  . Comprehensive metabolic panel  . B Nat Peptide  . Ambulatory referral to Cardiology  . EKG 12-Lead

## 2018-04-28 NOTE — Patient Instructions (Signed)
Great to see you. I will call you with your lab results from today and you can view them online.   We will call you with a cardiology referral.

## 2018-04-29 ENCOUNTER — Other Ambulatory Visit (INDEPENDENT_AMBULATORY_CARE_PROVIDER_SITE_OTHER): Payer: Commercial Managed Care - PPO

## 2018-04-29 ENCOUNTER — Encounter: Payer: Self-pay | Admitting: Family Medicine

## 2018-04-29 ENCOUNTER — Other Ambulatory Visit: Payer: Self-pay

## 2018-04-29 DIAGNOSIS — I10 Essential (primary) hypertension: Secondary | ICD-10-CM

## 2018-04-29 DIAGNOSIS — E559 Vitamin D deficiency, unspecified: Secondary | ICD-10-CM

## 2018-04-29 DIAGNOSIS — D729 Disorder of white blood cells, unspecified: Secondary | ICD-10-CM

## 2018-04-29 DIAGNOSIS — R0789 Other chest pain: Secondary | ICD-10-CM | POA: Diagnosis not present

## 2018-04-29 LAB — CBC WITH DIFFERENTIAL/PLATELET
BASOS PCT: 1.1 % (ref 0.0–3.0)
Basophils Absolute: 0 10*3/uL (ref 0.0–0.1)
Eosinophils Absolute: 0.1 10*3/uL (ref 0.0–0.7)
Eosinophils Relative: 2.4 % (ref 0.0–5.0)
HEMATOCRIT: 42.2 % (ref 36.0–46.0)
HEMOGLOBIN: 14.3 g/dL (ref 12.0–15.0)
LYMPHS PCT: 23.1 % (ref 12.0–46.0)
Lymphs Abs: 0.9 10*3/uL (ref 0.7–4.0)
MCHC: 33.8 g/dL (ref 30.0–36.0)
MCV: 91.7 fl (ref 78.0–100.0)
Monocytes Absolute: 0.5 10*3/uL (ref 0.1–1.0)
Monocytes Relative: 12.7 % — ABNORMAL HIGH (ref 3.0–12.0)
Neutro Abs: 2.3 10*3/uL (ref 1.4–7.7)
Neutrophils Relative %: 60.7 % (ref 43.0–77.0)
Platelets: 222 10*3/uL (ref 150.0–400.0)
RBC: 4.6 Mil/uL (ref 3.87–5.11)
RDW: 12.6 % (ref 11.5–15.5)
WBC: 3.8 10*3/uL — AB (ref 4.0–10.5)

## 2018-04-29 LAB — BRAIN NATRIURETIC PEPTIDE: Pro B Natriuretic peptide (BNP): 16 pg/mL (ref 0.0–100.0)

## 2018-04-29 MED ORDER — VITAMIN D3 1.25 MG (50000 UT) PO TABS: 1.0000 | ORAL_TABLET | ORAL | 0 refills | Status: DC

## 2018-05-12 ENCOUNTER — Other Ambulatory Visit (INDEPENDENT_AMBULATORY_CARE_PROVIDER_SITE_OTHER): Payer: Commercial Managed Care - PPO

## 2018-05-12 DIAGNOSIS — E559 Vitamin D deficiency, unspecified: Secondary | ICD-10-CM

## 2018-05-12 DIAGNOSIS — D729 Disorder of white blood cells, unspecified: Secondary | ICD-10-CM | POA: Diagnosis not present

## 2018-05-12 LAB — CBC
HEMATOCRIT: 42 % (ref 36.0–46.0)
Hemoglobin: 13.8 g/dL (ref 12.0–15.0)
MCHC: 32.8 g/dL (ref 30.0–36.0)
MCV: 81.7 fl (ref 78.0–100.0)
Platelets: 201 10*3/uL (ref 150.0–400.0)
RBC: 5.14 Mil/uL — ABNORMAL HIGH (ref 3.87–5.11)
RDW: 14.8 % (ref 11.5–15.5)
WBC: 6.9 10*3/uL (ref 4.0–10.5)

## 2018-05-12 NOTE — Addendum Note (Signed)
Addended by: Lynnea Ferrier on: 05/12/2018 09:27 AM   Modules accepted: Orders

## 2018-06-09 ENCOUNTER — Other Ambulatory Visit: Payer: Self-pay | Admitting: Family Medicine

## 2018-06-26 DIAGNOSIS — R21 Rash and other nonspecific skin eruption: Secondary | ICD-10-CM | POA: Diagnosis not present

## 2018-06-28 ENCOUNTER — Encounter: Payer: Self-pay | Admitting: Family Medicine

## 2018-06-28 ENCOUNTER — Ambulatory Visit: Payer: Commercial Managed Care - PPO | Admitting: Family Medicine

## 2018-06-28 VITALS — BP 128/82 | HR 49 | Temp 97.7°F | Ht 64.25 in | Wt 202.0 lb

## 2018-06-28 DIAGNOSIS — R21 Rash and other nonspecific skin eruption: Secondary | ICD-10-CM

## 2018-06-28 NOTE — Assessment & Plan Note (Signed)
New- does seem consistent with an allergic/contact dermatitis. Already taking a high dose prednisone taper.  Advised washing her sheets/bedding, clothes etc. Add oral antihistamine for itching (See AVS). Call or return to clinic prn if these symptoms worsen or fail to improve as anticipated. The patient indicates understanding of these issues and agrees with the plan.

## 2018-06-28 NOTE — Progress Notes (Signed)
Subjective:   Patient ID: Tanya Carr, female    DOB: January 31, 1971, 47 y.o.   MRN: [id number]  Tanya Carr is a pleasant 47 y.o. year old female who presents to clinic today with Rash (Ongoing for one week-began on the back of her neck-spread on her arms and trunk. Denies working outside or exposure to [place]. Denies changes in mediation or soaps. Admits to redness and itchness. She has tried topical cortizone cream with no improvement. Called [facility] and was prescribed prednisone-started taking on 06/26/18.  )  on 06/28/2018  HPI:  Rash- very itchy.  Started approximately one week ago- started on the back of her neck and spread to her arms and trunk. Denies working outside or having exposure to poison ivy.  Denies any changes in medication or soaps.   Called [facility] on 10/26 (two days ago) and was prednisone taper- 60 mg x 3 days, 50 mg x 2 days, 40 mg x 3 days, 30 mg x 3 days, 20 mg x 2 days, 10 mg x 2 days.  Just finished 60 mg daily dosage- starting 50 mg daily tomorrow. She did travel to [place] last week.  Does not think it is bed bugs as it keeps spreading and doesn't look like bites.  Husband does not have any symptoms.  It is not as diffuse, but it is still very itchy.     Current Outpatient Medications on File Prior to Visit  Medication Sig Dispense Refill  . amLODipine (NORVASC) 5 MG tablet TAKE 1 TABLET BY MOUTH DAILY. 90 tablet 2  . lisinopril (PRINIVIL,ZESTRIL) 30 MG tablet TAKE 1 TABLET BY MOUTH DAILY. 90 tablet 1  . predniSONE (STERAPRED UNI-PAK 21 TAB) 5 MG (21) TBPK tablet Take 5 mg by mouth daily.    . pregabalin (LYRICA) 75 MG capsule Take 1 capsule (75 mg total) by mouth 2 (two) times daily. 60 capsule 2  . tiZANidine (ZANAFLEX) 4 MG tablet TAKE 1 TABLET BY MOUTH 3 TIMES DAILY. 30 tablet 3   No current facility-administered medications on file prior to visit.     Allergies  Allergen Reactions  . Bee Pollen   . Food Hives    Seafood  allergy: hives and itching  . Shellfish Allergy     Past Medical History:  Diagnosis Date  . Fainting episodes 2011   history of with blood draw, mammogram, post op 2011  . Headache(784.0)    history of migraines  . Hypertension    on lisinopril-will do ekg today  . PONV (postoperative nausea and vomiting)    vagal response per pt    Past Surgical History:  Procedure Laterality Date  . ABDOMINAL HYSTERECTOMY    . ulnar nerve decompression  2011    Family History  Problem Relation Age of Onset  . Cancer Mother   . Multiple sclerosis Mother   . COPD Father   . Pulmonary embolism Brother     Social History   Socioeconomic History  . Marital status: Single    Spouse name: Not on file  . Number of children: Not on file  . Years of education: Not on file  . Highest education level: Not on file  Occupational History  . Not on file  Social Needs  . Financial resource strain: Not on file  . Food insecurity:    Worry: Not on file    Inability: Not on file  . Transportation needs:    Medical: Not on file  Non-medical: Not on file  Tobacco Use  . Smoking status: Former Smoker    Last attempt to quit: 11/16/1996    Years since quitting: 21.6  . Smokeless tobacco: Never Used  Substance and Sexual Activity  . Alcohol use: Yes    Comment: rare  . Drug use: No  . Sexual activity: Not on file  Lifestyle  . Physical activity:    Days per week: Not on file    Minutes per session: Not on file  . Stress: Not on file  Relationships  . Social connections:    Talks on phone: Not on file    Gets together: Not on file    Attends religious service: Not on file    Active member of club or organization: Not on file    Attends meetings of clubs or organizations: Not on file    Relationship status: Not on file  . Intimate partner violence:    Fear of current or ex partner: Not on file    Emotionally abused: Not on file    Physically abused: Not on file    Forced sexual  activity: Not on file  Other Topics Concern  . Not on file  Social History Narrative   G0   Travel agent   The PMH, PSH, Social History, Family History, Medications, and allergies have been reviewed in [facility], and have been updated if relevant.   Review of Systems  Cardiovascular: Negative.   Gastrointestinal: Negative.   Skin: Positive for rash.  Neurological: Negative.   Psychiatric/Behavioral: Negative.   All other systems reviewed and are negative.      Objective:    BP 128/82   Pulse (!) 49   Temp 97.7 F (36.5 C) (Oral)   Ht 5' 4.25" (1.632 m)   Wt 202 lb (91.6 kg)   LMP 11/10/2011   SpO2 98%   BMI 34.40 kg/m    Physical Exam  Constitutional: She is oriented to person, place, and time. She appears well-developed and well-nourished. No distress.  HENT:  Head: Normocephalic and atraumatic.  Eyes: EOM are normal.  Neck: Normal range of motion.  Cardiovascular: Normal rate.  Pulmonary/Chest: Effort normal.  Musculoskeletal: Normal range of motion.  Neurological: She is alert and oriented to person, place, and time. No cranial nerve deficit.  Skin: She is not diaphoretic.     Psychiatric: She has a normal mood and affect. Her behavior is normal. Judgment and thought content normal.  Nursing note and vitals reviewed.         Assessment & Plan:   No diagnosis found. No follow-ups on file.

## 2018-06-28 NOTE — Patient Instructions (Addendum)
Great to see you. Finish your prednisone.  Please try to avoid topical diphenhydramine (topical allegra or topical benadryl). These products are not proven to relieve itching and they can cause skin reactions. Instead, try hydrocortisone or steroid creams, calamine lotion, moisturizers and cool compresses for minor skin irritations.  Add oral benadryl or zyrtec for itching if necessary.  Also try to avoid products with "caine" anesthetics as they increase risk of skin irritation as well.

## 2018-07-02 ENCOUNTER — Telehealth: Payer: Self-pay | Admitting: Family Medicine

## 2018-07-02 DIAGNOSIS — R21 Rash and other nonspecific skin eruption: Secondary | ICD-10-CM

## 2018-07-02 NOTE — Telephone Encounter (Signed)
Copied from [place] [id number]. Topic: Quick Communication - See Telephone Encounter >> Jul 02, 2018  7:43 AM Gardiner Ramus wrote: CRM for notification. See Telephone encounter for: 07/02/18. Pt called and stated that she was seen 06/28/18 for a rash on her body. Pt states that the rash is still spreading and is very itchy. Can something be called in for the itch. Please advise [phone]

## 2018-07-06 MED ORDER — HYDROXYZINE HCL 10 MG PO TABS: 10.0000 mg | ORAL_TABLET | Freq: Three times a day (TID) | ORAL | 0 refills | Status: DC | PRN

## 2018-07-06 NOTE — Telephone Encounter (Signed)
If it's still spreading, she must be coming into contact with something still.  I will send vistaril in for the itching (this can make you sleepy- it is similar to benadryl) and refer her to dermatology.  Referral placed.

## 2018-07-06 NOTE — Telephone Encounter (Signed)
I called and spoke with patient. She is aware that Rx was sent in & that a referral has been placed for her.

## 2018-07-08 ENCOUNTER — Encounter: Payer: Self-pay | Admitting: Internal Medicine

## 2018-07-08 ENCOUNTER — Ambulatory Visit: Payer: Commercial Managed Care - PPO | Admitting: Internal Medicine

## 2018-07-08 VITALS — BP 132/82 | HR 76 | Ht 64.0 in | Wt 200.0 lb

## 2018-07-08 DIAGNOSIS — R0600 Dyspnea, unspecified: Secondary | ICD-10-CM | POA: Diagnosis not present

## 2018-07-08 DIAGNOSIS — R5383 Other fatigue: Secondary | ICD-10-CM | POA: Diagnosis not present

## 2018-07-08 DIAGNOSIS — R0683 Snoring: Secondary | ICD-10-CM

## 2018-07-08 DIAGNOSIS — R002 Palpitations: Secondary | ICD-10-CM | POA: Diagnosis not present

## 2018-07-08 DIAGNOSIS — R0789 Other chest pain: Secondary | ICD-10-CM | POA: Diagnosis not present

## 2018-07-08 NOTE — Consult Note (Signed)
Cardiology Office Note:    Date:  07/08/2018   ID:  Tanya Carr, DOB [birth year], MRN [id number]  PCP:  Lucille Passy, MD  Cardiologist:  No primary care provider on file.  Electrophysiologist:  None   Referring MD: Lucille Passy, MD   Decreased exercise tolerance, fatigue, throat squeezing at rest.  History of Present Illness:    Tanya Carr is a 47 y.o. female with a hx of likely neurocardiogenic syncope, hypertension, headaches who presents today for chest pressure, dyspnea and fatigue.  She went to her primary doctor with concerns of fatigue and was found to be vitamin D deficient, and was started on loading dose supplementation.  She did not tolerate the loading doses, and therefore switched to daily vitamin D supplementation.  She continues to feel a sense of decreased exercise tolerance.  She participates in [facility] several times per week, and notices that on occasion she will have days where she feels she cannot keep up the way she usually does.  While exerting herself she denies a sense of chest pressure, throat squeezing, or limiting shortness of breath.  She does endorse limiting fatigue on occasion.  She also has a sense of squeezing in her throat when she bends over to pet her cat or change the cat litter.  This does not occur every day but does happen on occasion.  While sitting at her desk she will occasionally get this sensation, and when she looks at her apple watch she notices that her heart rate is in the 30s but can then increase, and seems variable.  Her normal resting heart rate is approximately 55-60, which she feels is good.  She notes a sense of palpitations several times per week.  It feels like a sense like her heart is beating out of her chest.  She drinks 2 cups of coffee daily, has rare alcohol use, and denies herbal supplement or dietary product use.  She is a former smoker approximately 8 pack years quit over 20 years ago.  She denies syncope  or presyncope. Endorses snoring but has never been screened for sleep apnea.  She scores 9 points on the Epworth Sleepiness Scale.  She has a normal TSH from August.    Her brother had a motor vehicle accident, was obese, and died from what was likely a pulmonary embolism in his 78s.  Her father had COPD, and mother had multiple sclerosis and lung cancer.  Both mother and father did not have significant heart problems.   Past Medical History:  Diagnosis Date  . Fainting episodes 2011   history of with blood draw, mammogram, post op 2011  . Headache(784.0)    history of migraines  . Hypertension    on lisinopril-will do ekg today  . PONV (postoperative nausea and vomiting)    vagal response per pt    Past Surgical History:  Procedure Laterality Date  . ABDOMINAL HYSTERECTOMY    . ulnar nerve decompression  2011    Current Medications: Current Meds  Medication Sig  . amLODipine (NORVASC) 5 MG tablet TAKE 1 TABLET BY MOUTH DAILY.  . hydrOXYzine (ATARAX/VISTARIL) 10 MG tablet Take 1 tablet (10 mg total) by mouth 3 (three) times daily as needed.  Marland Kitchen lisinopril (PRINIVIL,ZESTRIL) 30 MG tablet TAKE 1 TABLET BY MOUTH DAILY.  Marland Kitchen predniSONE (STERAPRED UNI-PAK 21 TAB) 5 MG (21) TBPK tablet Take 5 mg by mouth daily.  . pregabalin (LYRICA) 75 MG capsule Take 1 capsule (75  mg total) by mouth 2 (two) times daily.  Marland Kitchen tiZANidine (ZANAFLEX) 4 MG tablet TAKE 1 TABLET BY MOUTH 3 TIMES DAILY.     Allergies:   Bee pollen; Food; Shellfish allergy; Vit d-vit e-safflower oil; and Vitamin d analogs   Social History   Socioeconomic History  . Marital status: Single    Spouse name: Not on file  . Number of children: Not on file  . Years of education: Not on file  . Highest education level: Not on file  Occupational History  . Not on file  Social Needs  . Financial resource strain: Not on file  . Food insecurity:    Worry: Not on file    Inability: Not on file  . Transportation needs:     Medical: Not on file    Non-medical: Not on file  Tobacco Use  . Smoking status: Former Smoker    Last attempt to quit: 11/16/1996    Years since quitting: 21.6  . Smokeless tobacco: Never Used  Substance and Sexual Activity  . Alcohol use: Yes    Comment: rare  . Drug use: No  . Sexual activity: Not on file  Lifestyle  . Physical activity:    Days per week: Not on file    Minutes per session: Not on file  . Stress: Not on file  Relationships  . Social connections:    Talks on phone: Not on file    Gets together: Not on file    Attends religious service: Not on file    Active member of club or organization: Not on file    Attends meetings of clubs or organizations: Not on file    Relationship status: Not on file  Other Topics Concern  . Not on file  Social History Narrative   G0   Travel agent     Family History: The patient's family history includes COPD in her father; Cancer in her mother; Multiple sclerosis in her mother; Pulmonary embolism in her brother.  ROS:   Please see the history of present illness.    All other systems reviewed and are negative.  EKGs/Labs/Other Studies Reviewed:    The following studies were reviewed today:  EKG:  EKG is ordered today.  The ekg ordered today demonstrates normal sinus rhythm ventricular rate 76 bpm.  Recent Labs: 04/28/2018: ALT 13; BUN 18; Creatinine, Ser 0.81; Potassium 4.2; Sodium 138; TSH 3.18 04/29/2018: Pro B Natriuretic peptide (BNP) 16.0 05/12/2018: Hemoglobin 13.8; Platelets 201.0  Recent Lipid Panel    Component Value Date/Time   CHOL 152 10/20/2017 0754   TRIG 92.0 10/20/2017 0754   HDL 46.70 10/20/2017 0754   CHOLHDL 3 10/20/2017 0754   VLDL 18.4 10/20/2017 0754   LDLCALC 87 10/20/2017 0754    Physical Exam:    VS:  BP 132/82   Pulse 76   Ht 5\' 4"  (1.626 m)   Wt 200 lb (90.7 kg)   LMP 11/10/2011   BMI 34.33 kg/m     Wt Readings from Last 3 Encounters:  07/08/18 200 lb (90.7 kg)  06/28/18 202  lb (91.6 kg)  04/28/18 198 lb 6.4 oz (90 kg)     Constitutional: No acute distress Eyes: pupils equally round and reactive to light, sclera non-icteric, normal conjunctiva and lids ENMT: normal dentition, moist mucous membranes Cardiovascular: regular rhythm, normal rate, no murmurs. S1 and S2 normal. Radial pulses normal bilaterally. No jugular venous distention.  Respiratory: clear to auscultation bilaterally GI : normal bowel  sounds, soft and nontender. No distention.   MSK: extremities warm, well perfused. No edema.  NEURO: grossly nonfocal exam, moves all extremities. PSYCH: alert and oriented x 3, normal mood and affect.   ASSESSMENT:    1. Chest pressure   2. Dyspnea, unspecified type   3. Fatigue, unspecified type   4. Atypical chest pain   5. Palpitations   6. Snoring    PLAN:    1. Chest pressure   2. Dyspnea, unspecified type   3. Fatigue, unspecified type   4. Atypical chest pain   It is encouraging to see that she is able to complete a challenging workout like orange theory without having to stop for chest pain, chest pressure, limiting shortness of breath.  She routinely gets her exercise heart rate above 85% of her maximum for greater than 12 minutes.  She is experiencing decreased exercise tolerance though.  We will obtain an echocardiogram, especially considering that she gets a sense of throat discomfort while bending to pet her cat or change the cat litter.  5. Palpitations   Given that her symptoms are not daily we will place a extended monitor such as a [employer] Patch to evaluate whether her symptoms of throat squeezing and palpitations while resting are related to ventricular ectopy, atrial ectopy, or pauses in the rhythm.  6.      Snoring  When the remainder of her testing is complete we should consider screening for sleep apnea.  She is not eager to complete this, however understands that it is important.  We will address this after echocardiogram and heart  monitor are complete.    Medication Adjustments/Labs and Tests Ordered: Current medicines are reviewed at length with the patient today.  Concerns regarding medicines are outlined above.  Orders Placed This Encounter  Procedures  . LONG TERM MONITOR (3-14 DAYS)  . EKG 12-Lead  . ECHOCARDIOGRAM COMPLETE   No orders of the defined types were placed in this encounter.   Patient Instructions  Medication Instructions:  Your physician recommends that you continue on your current medications as directed. Please refer to the Current Medication list given to you today.  If you need a refill on your cardiac medications before your next appointment, please call your pharmacy.   Lab work: None ordered If you have labs (blood work) drawn today and your tests are completely normal, you will receive your results only by: Marland Kitchen MyChart Message (if you have MyChart) OR . A paper copy in the mail If you have any lab test that is abnormal or we need to change your treatment, we will call you to review the results.  Testing/Procedures: Your physician has requested that you have an echocardiogram. Echocardiography is a painless test that uses sound waves to create images of your heart. It provides your doctor with information about the size and shape of your heart and how well your heart's chambers and valves are working. This procedure takes approximately one hour. There are no restrictions for this procedure.  Your physician has recommended that you wear an ziopatch monitor. ziopatch monitors are medical devices that record the heart's electrical activity. Doctors most often [place] these monitors to diagnose arrhythmias. Arrhythmias are problems with the speed or rhythm of the heartbeat. The monitor is a small, portable device. You can wear one while you do your normal daily activities. This is usually used to diagnose what is causing palpitations/syncope (passing out).    Follow-Up: At [facility], you  and your  health needs are our priority.  As part of our continuing mission to provide you with exceptional heart care, we have created designated Provider Care Teams.  These Care Teams include your primary Cardiologist (physician) and Advanced Practice Providers (APPs -  Physician Assistants and Nurse Practitioners) who all work together to provide you with the care you need, when you need it. You will need a follow up appointment as needed.  Any Other Special Instructions Will Be Listed Below (If Applicable).       Signed, Elouise Munroe, MD  07/08/2018 9:12 AM    [place]

## 2018-07-08 NOTE — Patient Instructions (Signed)
Medication Instructions:  Your physician recommends that you continue on your current medications as directed. Please refer to the Current Medication list given to you today.  If you need a refill on your cardiac medications before your next appointment, please call your pharmacy.   Lab work: None ordered If you have labs (blood work) drawn today and your tests are completely normal, you will receive your results only by: Marland Kitchen MyChart Message (if you have MyChart) OR . A paper copy in the mail If you have any lab test that is abnormal or we need to change your treatment, we will call you to review the results.  Testing/Procedures: Your physician has requested that you have an echocardiogram. Echocardiography is a painless test that uses sound waves to create images of your heart. It provides your doctor with information about the size and shape of your heart and how well your heart's chambers and valves are working. This procedure takes approximately one hour. There are no restrictions for this procedure.  Your physician has recommended that you wear an ziopatch monitor. ziopatch monitors are medical devices that record the heart's electrical activity. Doctors most often [place] these monitors to diagnose arrhythmias. Arrhythmias are problems with the speed or rhythm of the heartbeat. The monitor is a small, portable device. You can wear one while you do your normal daily activities. This is usually used to diagnose what is causing palpitations/syncope (passing out).    Follow-Up: At [facility], you and your health needs are our priority.  As part of our continuing mission to provide you with exceptional heart care, we have created designated Provider Care Teams.  These Care Teams include your primary Cardiologist (physician) and Advanced Practice Providers (APPs -  Physician Assistants and Nurse Practitioners) who all work together to provide you with the care you need, when you need it. You will  need a follow up appointment as needed.  Any Other Special Instructions Will Be Listed Below (If Applicable).

## 2018-07-19 DIAGNOSIS — L298 Other pruritus: Secondary | ICD-10-CM | POA: Diagnosis not present

## 2018-07-19 DIAGNOSIS — R21 Rash and other nonspecific skin eruption: Secondary | ICD-10-CM | POA: Diagnosis not present

## 2018-07-22 ENCOUNTER — Ambulatory Visit (INDEPENDENT_AMBULATORY_CARE_PROVIDER_SITE_OTHER): Payer: Commercial Managed Care - PPO

## 2018-07-22 ENCOUNTER — Encounter: Payer: Self-pay | Admitting: *Deleted

## 2018-07-22 ENCOUNTER — Other Ambulatory Visit: Payer: Self-pay

## 2018-07-22 ENCOUNTER — Ambulatory Visit (HOSPITAL_COMMUNITY): Payer: Commercial Managed Care - PPO | Attending: Cardiovascular Disease

## 2018-07-22 DIAGNOSIS — R002 Palpitations: Secondary | ICD-10-CM

## 2018-07-22 DIAGNOSIS — R5383 Other fatigue: Secondary | ICD-10-CM | POA: Diagnosis not present

## 2018-07-22 DIAGNOSIS — R0789 Other chest pain: Secondary | ICD-10-CM

## 2018-07-22 NOTE — Progress Notes (Signed)
Patient ID: Tanya Carr, female   DOB: 03/01/1971, 47 y.o.   MRN: [id number]  Long term holter monitor was ordered for patient.  Patient received quote from [employer] of $225 out of pocket.  After learning patient does intense cardio 4 days a week, it was clear the single [employer]IO patch would not be her best option.  The patch would come loose with significant perspiration and there is no replacement patch.   [employer] has a long term holter monitor and they quoted a price of $157.50.  The reports provide all the same data.  [employer] however is completely waterproof.  The patient may shower, bathe or swim with the monitor on.  [employer] also provides 2 additional patches in case one becomes soiled or loose. Patient decided on the [employer] monitor based on the above information.

## 2018-08-03 ENCOUNTER — Telehealth: Payer: Self-pay

## 2018-08-03 NOTE — Telephone Encounter (Signed)
-----   Message from Elouise Munroe, MD sent at 08/03/2018  9:38 AM EST ----- Normal echo

## 2018-08-19 NOTE — Telephone Encounter (Signed)
Pt aware of monitor results with verbal understanding. Pt will f/u prn.

## 2018-08-19 NOTE — Telephone Encounter (Signed)
-----   Message from Elouise Munroe, MD sent at 08/18/2018 12:39 PM EST ----- No worrisome findings - symptoms likely correlate to PVCs. They are not so frequent to mandate medical therapy, however we could try a very low dose beta blocker if they are very bothersome to her (metoprolol 12.5 mg BID). Her resting heart rate is already low so if she is not otherwise bothered by the PVCs, we can just observe.   If she'd like to schedule follow up, I'd be fine with that.

## 2018-09-11 ENCOUNTER — Other Ambulatory Visit: Payer: Self-pay | Admitting: Family Medicine

## 2018-10-22 ENCOUNTER — Other Ambulatory Visit: Payer: Self-pay | Admitting: Family Medicine

## 2018-10-22 DIAGNOSIS — Z1231 Encounter for screening mammogram for malignant neoplasm of breast: Secondary | ICD-10-CM

## 2018-10-22 DIAGNOSIS — E559 Vitamin D deficiency, unspecified: Secondary | ICD-10-CM | POA: Insufficient documentation

## 2018-10-22 NOTE — Progress Notes (Signed)
Subjective:   Patient ID: Tanya Carr, female    DOB: 04-Jul-1971, 48 y.o.   MRN: [id number]  Tanya Carr is a pleasant 48 y.o. year old female who presents to clinic today with Annual Exam (Patient is here today for a CPE without PAP. Currently fasting. S/P Hysterectomy.  Mammogram scheduled for today.  On 8.28.19 CMP/FSH/LH/Vit-D/TSH/BNP/CBC were completed and pt was found to have low vit-d and WBC.  She completed a course of Rx Vit-D and there is a future order in the system for that to be redrawn.)  on 10/25/2018  HPI:  Health Maintenance  Topic Date Due  . INFLUENZA VACCINE  04/09/2019 (Originally 04/01/2018)  . TETANUS/TDAP  09/04/2022  . HIV Screening  Completed   Remote history of hysterectomy Mammogram scheduled for today.  HTN- has been well controlled on Norvasc 5 mg daily and Lisinopril 30 mg daily.  Denies HA, blurred vision, CP or SOB.  No LE edema. It has been running low at home as well.  She does get dizzy every now and then when she's at the gym.  BP is low today. Doing [employer] 5 days per week. Lab Results  Component Value Date   CREATININE 0.81 04/28/2018   Wt Readings from Last 3 Encounters:  10/25/18 198 lb 3.2 oz (89.9 kg)  07/08/18 200 lb (90.7 kg)  06/28/18 202 lb (91.6 kg)    Vitamin D deficiency- Vitamin D was 26.34 on 04/28/18.  She couldn't tolerate high dose Vitamin D.   She did take OTC 2000 IU daily and could tolerate that.  WBC was slightly low at 3.8 in 04/2018- remainder of CBC was reassuring.  No current outpatient medications on file prior to visit.   No current facility-administered medications on file prior to visit.     Allergies  Allergen Reactions  . Bee Pollen   . Food Hives    Seafood allergy: hives and itching  . Shellfish Allergy   . Vit D-Vit E-Safflower Oil   . Vitamin D Analogs     Past Medical History:  Diagnosis Date  . Fainting episodes 2011   history of with blood draw, mammogram, post op  2011  . Headache(784.0)    history of migraines  . Hypertension    on lisinopril-will do ekg today  . PONV (postoperative nausea and vomiting)    vagal response per pt    Past Surgical History:  Procedure Laterality Date  . ABDOMINAL HYSTERECTOMY    . ulnar nerve decompression  2011    Family History  Problem Relation Age of Onset  . Cancer Mother   . Multiple sclerosis Mother   . COPD Father   . Pulmonary embolism Brother     Social History   Socioeconomic History  . Marital status: Single    Spouse name: Not on file  . Number of children: Not on file  . Years of education: Not on file  . Highest education level: Not on file  Occupational History  . Not on file  Social Needs  . Financial resource strain: Not on file  . Food insecurity:    Worry: Not on file    Inability: Not on file  . Transportation needs:    Medical: Not on file    Non-medical: Not on file  Tobacco Use  . Smoking status: Former Smoker    Last attempt to quit: 11/16/1996    Years since quitting: 21.9  . Smokeless tobacco: Never Used  Substance and Sexual Activity  . Alcohol use: Yes    Comment: rare  . Drug use: No  . Sexual activity: Not on file  Lifestyle  . Physical activity:    Days per week: Not on file    Minutes per session: Not on file  . Stress: Not on file  Relationships  . Social connections:    Talks on phone: Not on file    Gets together: Not on file    Attends religious service: Not on file    Active member of club or organization: Not on file    Attends meetings of clubs or organizations: Not on file    Relationship status: Not on file  . Intimate partner violence:    Fear of current or ex partner: Not on file    Emotionally abused: Not on file    Physically abused: Not on file    Forced sexual activity: Not on file  Other Topics Concern  . Not on file  Social History Narrative   G0   Travel agent   The PMH, PSH, Social History, Family History, Medications,  and allergies have been reviewed in [facility], and have been updated if relevant.   Review of Systems  Constitutional: Negative.   HENT: Negative.   Eyes: Negative.   Respiratory: Negative.   Cardiovascular: Negative.   Gastrointestinal: Negative.   Endocrine: Negative.   Genitourinary: Negative.   Musculoskeletal: Negative.   Allergic/Immunologic: Negative.   Neurological: Positive for dizziness and light-headedness. Negative for tremors, seizures, syncope, facial asymmetry, speech difficulty, weakness, numbness and headaches.  Hematological: Negative.   Psychiatric/Behavioral: Negative.   All other systems reviewed and are negative.      Objective:    BP 110/72 (BP Location: Left Arm, Patient Position: Sitting, Cuff Size: Normal)   Pulse 62   Temp (!) 97.5 F (36.4 C) (Oral)   Ht '5\' 4"'  (1.626 m)   Wt 198 lb 3.2 oz (89.9 kg)   LMP 11/10/2011   SpO2 99%   BMI 34.02 kg/m    Physical Exam   General:  Well-developed,well-nourished,in no acute distress; alert,appropriate and cooperative throughout examination Head:  normocephalic and atraumatic.   Eyes:  vision grossly intact, PERRL Ears:  R ear normal and L ear normal externally, TMs clear bilaterally Nose:  no external deformity.   Mouth:  good dentition.   Neck:  No deformities, masses, or tenderness noted. Breasts:  No mass, nodules, thickening, tenderness, bulging, retraction, inflamation, nipple discharge or skin changes noted.   Lungs:  Normal respiratory effort, chest expands symmetrically. Lungs are clear to auscultation, no crackles or wheezes. Heart:  Normal rate and regular rhythm. S1 and S2 normal without gallop, murmur, click, rub or other extra sounds. Abdomen:  Bowel sounds positive,abdomen soft and non-tender without masses, organomegaly or hernias noted. Msk:  No deformity or scoliosis noted of thoracic or lumbar spine.   Extremities:  No clubbing, cyanosis, edema, or deformity noted with normal full range of  motion of all joints.   Neurologic:  alert & oriented X3 and gait normal.   Skin:  Intact without suspicious lesions or rashes Cervical Nodes:  No lymphadenopathy noted Axillary Nodes:  No palpable lymphadenopathy Psych:  Cognition and judgment appear intact. Alert and cooperative with normal attention span and concentration. No apparent delusions, illusions, hallucinations        Assessment & Plan:   Well woman exam without gynecological exam - Plan: Lipid Profile, Comp Met (CMET), CBC w/Diff  Abnormal  WBC count - Plan: CBC w/Diff  Vitamin D deficiency  Essential hypertension - Plan: Lipid Profile, Comp Met (CMET) No follow-ups on file.

## 2018-10-25 ENCOUNTER — Encounter: Payer: Self-pay | Admitting: Family Medicine

## 2018-10-25 ENCOUNTER — Ambulatory Visit (INDEPENDENT_AMBULATORY_CARE_PROVIDER_SITE_OTHER): Payer: Commercial Managed Care - PPO | Admitting: Family Medicine

## 2018-10-25 ENCOUNTER — Ambulatory Visit
Admission: RE | Admit: 2018-10-25 | Discharge: 2018-10-25 | Disposition: A | Discharge status: Home / Self Care | Payer: Commercial Managed Care - PPO | Source: Ambulatory Visit

## 2018-10-25 VITALS — BP 110/72 | HR 62 | Temp 97.5°F | Ht 64.0 in | Wt 198.2 lb

## 2018-10-25 DIAGNOSIS — Z0000 Encounter for general adult medical examination without abnormal findings: Secondary | ICD-10-CM

## 2018-10-25 DIAGNOSIS — D729 Disorder of white blood cells, unspecified: Secondary | ICD-10-CM | POA: Diagnosis not present

## 2018-10-25 DIAGNOSIS — I10 Essential (primary) hypertension: Secondary | ICD-10-CM | POA: Diagnosis not present

## 2018-10-25 DIAGNOSIS — Z1231 Encounter for screening mammogram for malignant neoplasm of breast: Secondary | ICD-10-CM | POA: Diagnosis not present

## 2018-10-25 DIAGNOSIS — E559 Vitamin D deficiency, unspecified: Secondary | ICD-10-CM | POA: Diagnosis not present

## 2018-10-25 LAB — CBC WITH DIFFERENTIAL/PLATELET
Basophils Absolute: 0 10*3/uL (ref 0.0–0.1)
Basophils Relative: 0.7 % (ref 0.0–3.0)
EOS PCT: 1.8 % (ref 0.0–5.0)
Eosinophils Absolute: 0.1 10*3/uL (ref 0.0–0.7)
HCT: 41.9 % (ref 36.0–46.0)
Hemoglobin: 13.7 g/dL (ref 12.0–15.0)
LYMPHS ABS: 1.1 10*3/uL (ref 0.7–4.0)
Lymphocytes Relative: 19 % (ref 12.0–46.0)
MCHC: 32.8 g/dL (ref 30.0–36.0)
MCV: 82.7 fl (ref 78.0–100.0)
MONOS PCT: 6.1 % (ref 3.0–12.0)
Monocytes Absolute: 0.4 10*3/uL (ref 0.1–1.0)
NEUTROS ABS: 4.2 10*3/uL (ref 1.4–7.7)
NEUTROS PCT: 72.4 % (ref 43.0–77.0)
Platelets: 217 10*3/uL (ref 150.0–400.0)
RBC: 5.07 Mil/uL (ref 3.87–5.11)
RDW: 13.7 % (ref 11.5–15.5)
WBC: 5.8 10*3/uL (ref 4.0–10.5)

## 2018-10-25 LAB — COMPREHENSIVE METABOLIC PANEL
ALK PHOS: 48 U/L (ref 39–117)
ALT: 12 U/L (ref 0–35)
AST: 17 U/L (ref 0–37)
Albumin: 4.2 g/dL (ref 3.5–5.2)
BUN: 18 mg/dL (ref 6–23)
CHLORIDE: 101 meq/L (ref 96–112)
CO2: 29 meq/L (ref 19–32)
Calcium: 8.6 mg/dL (ref 8.4–10.5)
Creatinine, Ser: 0.82 mg/dL (ref 0.40–1.20)
GFR: 73.98 mL/min (ref >60.00)
GLUCOSE: 90 mg/dL (ref 70–99)
POTASSIUM: 3.9 meq/L (ref 3.5–5.1)
Sodium: 136 mEq/L (ref 135–145)
Total Bilirubin: 0.4 mg/dL (ref 0.2–1.2)
Total Protein: 7.1 g/dL (ref 6.0–8.3)

## 2018-10-25 LAB — LIPID PANEL
CHOL/HDL RATIO: 3
Cholesterol: 172 mg/dL (ref 0–200)
HDL: 55.7 mg/dL (ref >39.00)
LDL CALC: 103 mg/dL — AB (ref 0–99)
NONHDL: 116.65
Triglycerides: 66 mg/dL (ref 0.0–149.0)
VLDL: 13.2 mg/dL (ref 0.0–40.0)

## 2018-10-25 LAB — VITAMIN D 25 HYDROXY (VIT D DEFICIENCY, FRACTURES): VITD: 45.1 ng/mL (ref 30.00–100.00)

## 2018-10-25 MED ORDER — AMLODIPINE BESYLATE 5 MG PO TABS: 5.0000 mg | ORAL_TABLET | Freq: Every day | ORAL | 1 refills | Status: DC

## 2018-10-25 MED ORDER — LISINOPRIL 30 MG PO TABS: 30.0000 mg | ORAL_TABLET | Freq: Every day | ORAL | 1 refills | Status: DC

## 2018-10-25 NOTE — Assessment & Plan Note (Signed)
Well controlled now that she is exercising daily. Having symptoms of hypotension. D/c amlodipidine. Continue lisinopril 30 mg daily. She will check BP at home daily and update me with readings.

## 2018-10-25 NOTE — Patient Instructions (Addendum)
Great to see you. I will call you with your lab results from today and you can view them online.   We are stopping your amlodipine (norvasc).  Check you blood pressure daily for the next two and send my chart message.

## 2018-10-25 NOTE — Assessment & Plan Note (Signed)
Repeat Vit D today 

## 2018-10-25 NOTE — Assessment & Plan Note (Signed)
Reviewed preventive care protocols, scheduled due services, and updated immunizations Discussed nutrition, exercise, diet, and healthy lifestyle.  

## 2018-11-08 ENCOUNTER — Encounter: Payer: Self-pay | Admitting: Family Medicine

## 2018-11-08 ENCOUNTER — Other Ambulatory Visit: Payer: Self-pay | Admitting: Family Medicine

## 2018-11-08 DIAGNOSIS — R5383 Other fatigue: Secondary | ICD-10-CM

## 2018-11-13 ENCOUNTER — Other Ambulatory Visit: Payer: Self-pay | Admitting: Family Medicine

## 2019-05-13 ENCOUNTER — Other Ambulatory Visit: Payer: Self-pay | Admitting: Family Medicine

## 2019-10-12 ENCOUNTER — Other Ambulatory Visit: Payer: Self-pay | Admitting: Family Medicine

## 2019-10-12 NOTE — Telephone Encounter (Signed)
Pt no longer taking this medication/thx dmf

## 2019-10-18 ENCOUNTER — Other Ambulatory Visit: Payer: Self-pay | Admitting: Nurse Practitioner

## 2019-10-18 DIAGNOSIS — Z1231 Encounter for screening mammogram for malignant neoplasm of breast: Secondary | ICD-10-CM

## 2019-11-07 ENCOUNTER — Other Ambulatory Visit: Payer: Self-pay

## 2019-11-07 ENCOUNTER — Encounter: Payer: Commercial Managed Care - PPO | Admitting: Family Medicine

## 2019-11-08 ENCOUNTER — Ambulatory Visit
Admission: RE | Admit: 2019-11-08 | Discharge: 2019-11-08 | Disposition: A | Discharge status: Home / Self Care | Payer: Commercial Managed Care - PPO | Source: Ambulatory Visit | Attending: Nurse Practitioner | Admitting: Nurse Practitioner

## 2019-11-08 ENCOUNTER — Encounter: Payer: Self-pay | Admitting: Nurse Practitioner

## 2019-11-08 ENCOUNTER — Ambulatory Visit (INDEPENDENT_AMBULATORY_CARE_PROVIDER_SITE_OTHER): Payer: Commercial Managed Care - PPO | Admitting: Nurse Practitioner

## 2019-11-08 VITALS — BP 122/80 | HR 50 | Temp 97.5°F | Ht 64.0 in | Wt 192.1 lb

## 2019-11-08 DIAGNOSIS — M62838 Other muscle spasm: Secondary | ICD-10-CM | POA: Diagnosis not present

## 2019-11-08 DIAGNOSIS — Z0000 Encounter for general adult medical examination without abnormal findings: Secondary | ICD-10-CM

## 2019-11-08 DIAGNOSIS — Z136 Encounter for screening for cardiovascular disorders: Secondary | ICD-10-CM | POA: Diagnosis not present

## 2019-11-08 DIAGNOSIS — N951 Menopausal and female climacteric states: Secondary | ICD-10-CM | POA: Diagnosis not present

## 2019-11-08 DIAGNOSIS — Z13220 Encounter for screening for lipoid disorders: Secondary | ICD-10-CM | POA: Diagnosis not present

## 2019-11-08 DIAGNOSIS — Z1231 Encounter for screening mammogram for malignant neoplasm of breast: Secondary | ICD-10-CM

## 2019-11-08 DIAGNOSIS — I10 Essential (primary) hypertension: Secondary | ICD-10-CM | POA: Diagnosis not present

## 2019-11-08 DIAGNOSIS — E559 Vitamin D deficiency, unspecified: Secondary | ICD-10-CM

## 2019-11-08 DIAGNOSIS — Z1211 Encounter for screening for malignant neoplasm of colon: Secondary | ICD-10-CM

## 2019-11-08 DIAGNOSIS — Z0001 Encounter for general adult medical examination with abnormal findings: Secondary | ICD-10-CM

## 2019-11-08 LAB — CBC WITH DIFFERENTIAL/PLATELET
Basophils Absolute: 0 10*3/uL (ref 0.0–0.1)
Basophils Relative: 0.6 % (ref 0.0–3.0)
Eosinophils Absolute: 0.1 10*3/uL (ref 0.0–0.7)
Eosinophils Relative: 1.5 % (ref 0.0–5.0)
HCT: 41.2 % (ref 36.0–46.0)
Hemoglobin: 13.6 g/dL (ref 12.0–15.0)
Lymphocytes Relative: 18.3 % (ref 12.0–46.0)
Lymphs Abs: 1.1 10*3/uL (ref 0.7–4.0)
MCHC: 32.9 g/dL (ref 30.0–36.0)
MCV: 82.7 fl (ref 78.0–100.0)
Monocytes Absolute: 0.4 10*3/uL (ref 0.1–1.0)
Monocytes Relative: 7.1 % (ref 3.0–12.0)
Neutro Abs: 4.2 10*3/uL (ref 1.4–7.7)
Neutrophils Relative %: 72.5 % (ref 43.0–77.0)
Platelets: 201 10*3/uL (ref 150.0–400.0)
RBC: 4.98 Mil/uL (ref 3.87–5.11)
RDW: 14 % (ref 11.5–15.5)
WBC: 5.8 10*3/uL (ref 4.0–10.5)

## 2019-11-08 LAB — COMPREHENSIVE METABOLIC PANEL
ALT: 16 U/L (ref 0–35)
AST: 19 U/L (ref 0–37)
Albumin: 4.2 g/dL (ref 3.5–5.2)
Alkaline Phosphatase: 45 U/L (ref 39–117)
BUN: 12 mg/dL (ref 6–23)
CO2: 32 mEq/L (ref 19–32)
Calcium: 9.1 mg/dL (ref 8.4–10.5)
Chloride: 100 mEq/L (ref 96–112)
Creatinine, Ser: 0.84 mg/dL (ref 0.40–1.20)
GFR: 71.65 mL/min (ref >60.00)
Glucose, Bld: 82 mg/dL (ref 70–99)
Potassium: 4 mEq/L (ref 3.5–5.1)
Sodium: 138 mEq/L (ref 135–145)
Total Bilirubin: 0.6 mg/dL (ref 0.2–1.2)
Total Protein: 6.8 g/dL (ref 6.0–8.3)

## 2019-11-08 LAB — LIPID PANEL
Cholesterol: 150 mg/dL (ref 0–200)
HDL: 51.6 mg/dL (ref >39.00)
LDL Cholesterol: 87 mg/dL (ref 0–99)
NonHDL: 98.39
Total CHOL/HDL Ratio: 3
Triglycerides: 56 mg/dL (ref 0.0–149.0)
VLDL: 11.2 mg/dL (ref 0.0–40.0)

## 2019-11-08 LAB — FOLLICLE STIMULATING HORMONE: FSH: 39.3 m[IU]/mL

## 2019-11-08 LAB — LUTEINIZING HORMONE: LH: 23.86 m[IU]/mL

## 2019-11-08 LAB — TSH: TSH: 2.23 u[IU]/mL (ref 0.35–4.50)

## 2019-11-08 MED ORDER — CYCLOBENZAPRINE HCL 7.5 MG PO TABS: 7.5000 mg | ORAL_TABLET | Freq: Every evening | ORAL | 0 refills | Status: DC | PRN

## 2019-11-08 NOTE — Patient Instructions (Addendum)
LH and FSH indicate possible menopause. Pending estrogen level. Normal CBC, CMP, and lipid panel Consider use of SSRI vs HRT to manage symptoms? You will be contacted to schedule appt with GI.    Health Maintenance, Female Adopting a healthy lifestyle and getting preventive care are important in promoting health and wellness. Ask your health care provider about:  The right schedule for you to have regular tests and exams.  Things you can do on your own to prevent diseases and keep yourself healthy. What should I know about diet, weight, and exercise? Eat a healthy diet   Eat a diet that includes plenty of vegetables, fruits, low-fat dairy products, and lean protein.  Do not eat a lot of foods that are high in solid fats, added sugars, or sodium. Maintain a healthy weight Body mass index (BMI) is used to identify weight problems. It estimates body fat based on height and weight. Your health care provider can help determine your BMI and help you achieve or maintain a healthy weight. Get regular exercise Get regular exercise. This is one of the most important things you can do for your health. Most adults should:  Exercise for at least 150 minutes each week. The exercise should increase your heart rate and make you sweat (moderate-intensity exercise).  Do strengthening exercises at least twice a week. This is in addition to the moderate-intensity exercise.  Spend less time sitting. Even light physical activity can be beneficial. Watch cholesterol and blood lipids Have your blood tested for lipids and cholesterol at 51 years of age, then have this test every 5 years. Have your cholesterol levels checked more often if:  Your lipid or cholesterol levels are high.  You are older than 51 years of age.  You are at high risk for heart disease. What should I know about cancer screening? Depending on your health history and family history, you may need to have cancer screening at various  ages. This may include screening for:  Breast cancer.  Cervical cancer.  Colorectal cancer.  Skin cancer.  Lung cancer. What should I know about heart disease, diabetes, and high blood pressure? Blood pressure and heart disease  High blood pressure causes heart disease and increases the risk of stroke. This is more likely to develop in people who have high blood pressure readings, are of African descent, or are overweight.  Have your blood pressure checked: ? Every 3-5 years if you are 31-6 years of age. ? Every year if you are 52 years old or older. Diabetes Have regular diabetes screenings. This checks your fasting blood sugar level. Have the screening done:  Once every three years after age 28 if you are at a normal weight and have a low risk for diabetes.  More often and at a younger age if you are overweight or have a high risk for diabetes. What should I know about preventing infection? Hepatitis B If you have a higher risk for hepatitis B, you should be screened for this virus. Talk with your health care provider to find out if you are at risk for hepatitis B infection. Hepatitis C Testing is recommended for:  Everyone born from [birth year] through 1967.  Anyone with known risk factors for hepatitis C. Sexually transmitted infections (STIs)  Get screened for STIs, including gonorrhea and chlamydia, if: ? You are sexually active and are younger than 51 years of age. ? You are older than 51 years of age and your health care provider tells you that  you are at risk for this type of infection. ? Your sexual activity has changed since you were last screened, and you are at increased risk for chlamydia or gonorrhea. Ask your health care provider if you are at risk.  Ask your health care provider about whether you are at high risk for HIV. Your health care provider may recommend a prescription medicine to help prevent HIV infection. If you choose to take medicine to prevent HIV, you  should first get tested for HIV. You should then be tested every 3 months for as long as you are taking the medicine. Pregnancy  If you are about to stop having your period (premenopausal) and you may become pregnant, seek counseling before you get pregnant.  Take 400 to 800 micrograms (mcg) of folic acid every day if you become pregnant.  Ask for birth control (contraception) if you want to prevent pregnancy. Osteoporosis and menopause Osteoporosis is a disease in which the bones lose minerals and strength with aging. This can result in bone fractures. If you are 32 years old or older, or if you are at risk for osteoporosis and fractures, ask your health care provider if you should:  Be screened for bone loss.  Take a calcium or vitamin D supplement to lower your risk of fractures.  Be given hormone replacement therapy (HRT) to treat symptoms of menopause. Follow these instructions at home: Lifestyle  Do not use any products that contain nicotine or tobacco, such as cigarettes, e-cigarettes, and chewing tobacco. If you need help quitting, ask your health care provider.  Do not use street drugs.  Do not share needles.  Ask your health care provider for help if you need support or information about quitting drugs. Alcohol use  Do not drink alcohol if: ? Your health care provider tells you not to drink. ? You are pregnant, may be pregnant, or are planning to become pregnant.  If you drink alcohol: ? Limit how much you use to 0-1 drink a day. ? Limit intake if you are breastfeeding.  Be aware of how much alcohol is in your drink. In the [place] one drink equals one 12 oz bottle of beer (355 mL), one 5 oz glass of wine (148 mL), or one 1 oz glass of hard liquor (44 mL). General instructions  Schedule regular health, dental, and eye exams.  Stay current with your vaccines.  Tell your health care provider if: ? You often feel depressed. ? You have ever been abused or do not feel  safe at home. Summary  Adopting a healthy lifestyle and getting preventive care are important in promoting health and wellness.  Follow your health care provider's instructions about healthy diet, exercising, and getting tested or screened for diseases.  Follow your health care provider's instructions on monitoring your cholesterol and blood pressure. This information is not intended to replace advice given to you by your health care provider. Make sure you discuss any questions you have with your health care provider. Document Revised: 08/11/2018 Document Reviewed: 08/11/2018 Elsevier Patient Education  2020 [employer].

## 2019-11-08 NOTE — Progress Notes (Signed)
Subjective:    Patient ID: Tanya Carr, female    DOB: 1970/11/27, 49 y.o.   MRN: [id number]  Patient presents today for complete physical and eval of chronic conditions.  Back Pain This is a chronic problem. The current episode started more than 1 year ago. The problem occurs intermittently. The problem has been waxing and waning since onset. The pain is present in the lumbar spine. The quality of the pain is described as cramping. The pain radiates to the left thigh and left foot. The pain is severe. The pain is worse during the night. The symptoms are aggravated by lying down. Associated symptoms include leg pain. Pertinent negatives include no bladder incontinence, bowel incontinence, dysuria, headaches, numbness, paresis, paresthesias, pelvic pain, perianal numbness, tingling, weakness or weight loss. Risk factors include poor posture, sedentary lifestyle and obesity. She has tried analgesics and muscle relaxant for the symptoms. The treatment provided significant relief.    HTN: BP at goal with amlodipine and lisinopril. BP Readings from Last 3 Encounters:  11/08/19 122/80  10/25/18 110/72  07/08/18 132/82   Sexual History (orientation,birth control, marital status, STD):married, sexually active, s/p hysterectomy, ovaries still present. Reports worsening hot flashes. Will like to know if she is menopausal?  Depression/Suicide: Depression screen [facility] 2/9 11/08/2019 10/25/2018 10/20/2017 08/15/2015 10/14/2013  Decreased Interest 0 0 0 0 0  Down, Depressed, Hopeless 0 0 0 0 0  PHQ - 2 Score 0 0 0 0 0   Vision:up to date  Dental:up to date  Immunizations: (TDAP, Hep C screen, Pneumovax, Influenza, zoster)  Health Maintenance  Topic Date Due  . Colon Cancer Screening  Never done  . Flu Shot  11/30/2019*  . Mammogram  11/07/2021  . Tetanus Vaccine  09/04/2022  . HIV Screening  Completed  *Topic was postponed. The date shown is not the original due date.    Diet:regular.  Weight:  Wt Readings from Last 3 Encounters:  11/08/19 192 lb 1.6 oz (87.1 kg)  10/25/18 198 lb 3.2 oz (89.9 kg)  07/08/18 200 lb (90.7 kg)    Exercise:none  Fall Risk: Fall Risk  10/25/2018 10/20/2017 10/14/2013  Falls in the past year? 0 No No  Follow up Falls evaluation completed - -   Advanced Directive: Advanced Directives 11/25/2011  Does Patient Have a Medical Advance Directive? Patient does not have advance directive  Pre-existing out of facility DNR order (yellow form or pink MOST form) No     Medications and allergies reviewed with patient and updated if appropriate.  Patient Active Problem List   Diagnosis Date Noted  . HTN (hypertension) 10/20/2017  . Anxiety 03/06/2017  . DDD (degenerative disc disease), lumbosacral 09/24/2016  . Vitamin D deficiency 07/11/2009    Current Outpatient Medications on File Prior to Visit  Medication Sig Dispense Refill  . tiZANidine (ZANAFLEX) 4 MG tablet TAKE 1 TABLET BY MOUTH 3 TIMES DAILY. 30 tablet 3   No current facility-administered medications on file prior to visit.    Past Medical History:  Diagnosis Date  . Fainting episodes 2011   history of with blood draw, mammogram, post op 2011  . Headache(784.0)    history of migraines  . Hypertension    on lisinopril-will do ekg today  . PONV (postoperative nausea and vomiting)    vagal response per pt    Past Surgical History:  Procedure Laterality Date  . ABDOMINAL HYSTERECTOMY    . ulnar nerve decompression  2011    Social History  Socioeconomic History  . Marital status: Single    Spouse name: Not on file  . Number of children: Not on file  . Years of education: Not on file  . Highest education level: Not on file  Occupational History  . Not on file  Tobacco Use  . Smoking status: Former Smoker    Quit date: 11/16/1996    Years since quitting: 22.9  . Smokeless tobacco: Never Used  Substance and Sexual Activity  . Alcohol use: Yes     Comment: rare  . Drug use: No  . Sexual activity: Not on file  Other Topics Concern  . Not on file  Social History Narrative   G0   Garment/textile technologist   Social Determinants of Health   Financial Resource Strain:   . Difficulty of Paying Living Expenses:   Food Insecurity:   . Worried About Charity fundraiser in the Last Year:   . Arboriculturist in the Last Year:   Transportation Needs:   . Film/video editor (Medical):   Marland Kitchen Lack of Transportation (Non-Medical):   Physical Activity:   . Days of Exercise per Week:   . Minutes of Exercise per Session:   Stress:   . Feeling of Stress :   Social Connections:   . Frequency of Communication with Friends and Family:   . Frequency of Social Gatherings with Friends and Family:   . Attends Religious Services:   . Active Member of Clubs or Organizations:   . Attends Archivist Meetings:   Marland Kitchen Marital Status:     Family History  Problem Relation Age of Onset  . Cancer Mother   . Multiple sclerosis Mother   . COPD Father   . Pulmonary embolism Brother        Review of Systems  Constitutional: Negative for weight loss.  Gastrointestinal: Negative for bowel incontinence.  Genitourinary: Negative for bladder incontinence, dysuria and pelvic pain.  Musculoskeletal: Positive for back pain.  Neurological: Negative for tingling, weakness, numbness, headaches and paresthesias.   Objective:   Vitals:   11/08/19 0900  BP: 122/80  Pulse: (!) 50  Temp: (!) 97.5 F (36.4 C)  SpO2: 98%    Body mass index is 32.97 kg/m.  Physical Examination:  Physical Exam Vitals reviewed.  Constitutional:      General: She is not in acute distress.    Appearance: She is well-developed.  HENT:     Right Ear: Tympanic membrane, ear canal and external ear normal.     Left Ear: Tympanic membrane, ear canal and external ear normal.     Mouth/Throat:     Pharynx: No oropharyngeal exudate.  Eyes:     Extraocular Movements: Extraocular  movements intact.     Conjunctiva/sclera: Conjunctivae normal.  Cardiovascular:     Rate and Rhythm: Normal rate and regular rhythm.     Heart sounds: Normal heart sounds.  Pulmonary:     Effort: Pulmonary effort is normal. No respiratory distress.     Breath sounds: Normal breath sounds.  Chest:     Chest wall: No tenderness.  Abdominal:     General: Bowel sounds are normal.     Palpations: Abdomen is soft.  Genitourinary:    Comments: Declined pelvic and breast exam Musculoskeletal:        General: Normal range of motion.     Cervical back: Normal range of motion and neck supple.  Lymphadenopathy:     Cervical: No cervical  adenopathy.  Skin:    General: Skin is warm and dry.  Neurological:     Mental Status: She is alert and oriented to person, place, and time.     Sensory: Sensation is intact.     Motor: Motor function is intact.     Coordination: Coordination is intact.     Gait: Gait is intact.     Deep Tendon Reflexes: Reflexes are normal and symmetric.  Psychiatric:        Mood and Affect: Mood normal.        Behavior: Behavior normal.        Thought Content: Thought content normal.    ASSESSMENT and PLAN: This visit occurred during the SARS-CoV-2 public health emergency.  Safety protocols were in place, including screening questions prior to the visit, additional usage of staff PPE, and extensive cleaning of exam room while observing appropriate contact time as indicated for disinfecting solutions.   Evalynne was seen today for establish care.  Diagnoses and all orders for this visit:  Encounter for preventative adult health care exam with abnormal findings -     CBC with Differential/Platelet -     Comprehensive metabolic panel -     Lipid panel  Essential hypertension -     TSH -     amLODipine (NORVASC) 5 MG tablet; Take 1 tablet (5 mg total) by mouth daily. -     lisinopril (ZESTRIL) 30 MG tablet; Take 1 tablet (30 mg total) by mouth daily.  Vitamin D  deficiency -     Vitamin D 1,25 dihydroxy  Encounter for lipid screening for cardiovascular disease -     Lipid panel  Perimenopausal vasomotor symptoms -     Estrogens, Total -     FSH -     LH  Muscle spasm -     cyclobenzaprine (FEXMID) 7.5 MG tablet; Take 1 tablet (7.5 mg total) by mouth at bedtime as needed for muscle spasms.  Colon cancer screening -     Ambulatory referral to Gastroenterology   No problem-specific Assessment & Plan notes found for this encounter.     Problem List Items Addressed This Visit      Cardiovascular and Mediastinum   HTN (hypertension)   Relevant Medications   amLODipine (NORVASC) 5 MG tablet   lisinopril (ZESTRIL) 30 MG tablet   Other Relevant Orders   TSH (Completed)     Other   Vitamin D deficiency   Relevant Orders   Vitamin D 1,25 dihydroxy    Other Visit Diagnoses    Encounter for preventative adult health care exam with abnormal findings    -  Primary   Relevant Orders   CBC with Differential/Platelet (Completed)   Comprehensive metabolic panel (Completed)   Lipid panel (Completed)   Encounter for lipid screening for cardiovascular disease       Relevant Orders   Lipid panel (Completed)   Perimenopausal vasomotor symptoms       Relevant Orders   Estrogens, Total   FSH (Completed)   LH (Completed)   Muscle spasm       Relevant Medications   cyclobenzaprine (FEXMID) 7.5 MG tablet   Colon cancer screening       Relevant Orders   Ambulatory referral to Gastroenterology      Follow up: Return in about 6 months (around 05/10/2020) for HTN (video).  Wilfred Lacy, NP

## 2019-11-09 ENCOUNTER — Encounter: Payer: Self-pay | Admitting: Gastroenterology

## 2019-11-10 ENCOUNTER — Encounter: Payer: Self-pay | Admitting: Nurse Practitioner

## 2019-11-10 MED ORDER — LISINOPRIL 30 MG PO TABS: 30.0000 mg | ORAL_TABLET | Freq: Every day | ORAL | 3 refills | Status: DC

## 2019-11-10 MED ORDER — AMLODIPINE BESYLATE 5 MG PO TABS: 5.0000 mg | ORAL_TABLET | Freq: Every day | ORAL | 3 refills | Status: DC

## 2019-11-12 LAB — VITAMIN D 1,25 DIHYDROXY
Vitamin D 1, 25 (OH)2 Total: 53 pg/mL (ref 18–72)
Vitamin D2 1, 25 (OH)2: <8 pg/mL
Vitamin D3 1, 25 (OH)2: 53 pg/mL

## 2019-11-12 LAB — ESTROGENS, TOTAL: Estrogen: 429.9 pg/mL

## 2019-11-29 ENCOUNTER — Ambulatory Visit (AMBULATORY_SURGERY_CENTER): Payer: Self-pay

## 2019-11-29 ENCOUNTER — Other Ambulatory Visit: Payer: Self-pay

## 2019-11-29 VITALS — Temp 97.1°F | Ht 64.0 in | Wt 186.4 lb

## 2019-11-29 DIAGNOSIS — Z1211 Encounter for screening for malignant neoplasm of colon: Secondary | ICD-10-CM

## 2019-11-29 DIAGNOSIS — Z01818 Encounter for other preprocedural examination: Secondary | ICD-10-CM

## 2019-11-29 MED ORDER — NA SULFATE-K SULFATE-MG SULF 17.5-3.13-1.6 GM/177ML PO SOLN: 1.0000 | Freq: Once | ORAL | 0 refills | Status: AC

## 2019-11-29 NOTE — Progress Notes (Signed)
No allergies to soy or egg Pt is not on blood thinners or diet pills Denies issues with sedation/intubation Denies atrial flutter/fib Denies constipation   Emmi instructions given to pt  Pt is aware of Covid safety and care partner requirements.  

## 2019-12-15 ENCOUNTER — Encounter: Payer: Self-pay | Admitting: Gastroenterology

## 2019-12-15 ENCOUNTER — Ambulatory Visit (INDEPENDENT_AMBULATORY_CARE_PROVIDER_SITE_OTHER): Payer: Commercial Managed Care - PPO

## 2019-12-15 ENCOUNTER — Other Ambulatory Visit: Payer: Self-pay | Admitting: Gastroenterology

## 2019-12-15 DIAGNOSIS — Z1159 Encounter for screening for other viral diseases: Secondary | ICD-10-CM

## 2019-12-16 LAB — SARS CORONAVIRUS 2 (TAT 6-24 HRS): SARS Coronavirus 2: NEGATIVE

## 2019-12-19 ENCOUNTER — Encounter: Payer: Self-pay | Admitting: Gastroenterology

## 2019-12-19 ENCOUNTER — Ambulatory Visit (AMBULATORY_SURGERY_CENTER): Payer: Commercial Managed Care - PPO | Admitting: Gastroenterology

## 2019-12-19 ENCOUNTER — Other Ambulatory Visit: Payer: Self-pay

## 2019-12-19 VITALS — BP 122/70 | HR 52 | Temp 98.2°F | Resp 9 | Ht 64.0 in | Wt 186.4 lb

## 2019-12-19 DIAGNOSIS — D120 Benign neoplasm of cecum: Secondary | ICD-10-CM

## 2019-12-19 DIAGNOSIS — Z1211 Encounter for screening for malignant neoplasm of colon: Secondary | ICD-10-CM

## 2019-12-19 MED ORDER — SODIUM CHLORIDE 0.9 % IV SOLN: 500.0000 mL | Freq: Once | INTRAVENOUS | Status: DC

## 2019-12-19 NOTE — Progress Notes (Signed)
To PACU, VSS. Report to [email or web address] 

## 2019-12-19 NOTE — Op Note (Signed)
[facility] Patient Name: Tanya Carr Procedure Date: 12/19/2019 7:11 AM MRN: [id number] Endoscopist: Thornton Park MD, MD Age: 49 Referring MD:  Date of Birth: [birth year] Gender: Female Account #: [id number] Procedure:                Colonoscopy Indications:              Screening for colorectal malignant neoplasm, This                            is the patient's first colonoscopy                           No known family history of colon cancer or polyps Medicines:                Monitored Anesthesia Care Procedure:                Pre-Anesthesia Assessment:                           - Prior to the procedure, a History and Physical                            was performed, and patient medications and                            allergies were reviewed. The patient's tolerance of                            previous anesthesia was also reviewed. The risks                            and benefits of the procedure and the sedation                            options and risks were discussed with the patient.                            All questions were answered, and informed consent                            was obtained. Prior Anticoagulants: The patient has                            taken no previous anticoagulant or antiplatelet                            agents. ASA Grade Assessment: II - A patient with                            mild systemic disease. After reviewing the risks                            and benefits, the patient was deemed in  satisfactory condition to undergo the procedure.                           After obtaining informed consent, the colonoscope                            was passed under direct vision. Throughout the                            procedure, the patient's blood pressure, pulse, and                            oxygen saturations were monitored continuously. The                            Colonoscope was  introduced through the anus and                            advanced to the 4 cm into the ileum. A second                            forward view of the right colon was performed. The                            colonoscopy was performed without difficulty. The                            patient tolerated the procedure well. The quality                            of the bowel preparation was good. The terminal                            ileum, ileocecal valve, appendiceal orifice, and                            rectum were photographed. Scope In: 8:05:19 AM Scope Out: 8:17:19 AM Scope Withdrawal Time: 0 hours 8 minutes 54 seconds  Total Procedure Duration: 0 hours 12 minutes 0 seconds  Findings:                 The perianal and digital rectal examinations were                            normal.                           A few small-mouthed diverticula were found in the                            sigmoid colon.                           A 3 mm "possible" polyp was found in the ileocecal  valve. The possible polyp was flat. The polyp was                            removed with a piecemeal technique using a cold                            biopsy forceps. Resection and retrieval were                            complete. Estimated blood loss was minimal.                           Non-bleeding internal hemorrhoids were found. The                            hemorrhoids were small.                           The exam was otherwise without abnormality on                            direct and retroflexion views. Complications:            No immediate complications. Estimated blood loss:                            Minimal. Estimated Blood Loss:     Estimated blood loss was minimal. Impression:               - Diverticulosis in the sigmoid colon.                           - One 3 mm polyp at the ileocecal valve, removed                            piecemeal using a cold biopsy  forceps. Resected and                            retrieved.                           - Non-bleeding internal hemorrhoids.                           - The examination was otherwise normal on direct                            and retroflexion views. Recommendation:           - Patient has a contact number available for                            emergencies. The signs and symptoms of potential                            delayed complications were discussed with the  patient. Return to normal activities tomorrow.                            Written discharge instructions were provided to the                            patient.                           - Resume previous diet.                           - Continue present medications.                           - Await pathology results.                           - Repeat colonoscopy date to be determined after                            pending pathology results are reviewed for                            surveillance.                           - Emerging evidence supports eating a diet of                            fruits, vegetables, grains, calcium, and yogurt                            while reducing red meat and alcohol may reduce the                            risk of colon cancer.                           - Thank you for allowing me to be involved in your                            colon cancer prevention. Thornton Park MD, MD 12/19/2019 8:24:52 AM This report has been signed electronically.

## 2019-12-19 NOTE — Progress Notes (Signed)
Pt's states no medical or surgical changes since previsit or office visit.  Temp JB VS DT  

## 2019-12-19 NOTE — Progress Notes (Signed)
Called to room to assist during endoscopic procedure.  Patient ID and intended procedure confirmed with present staff. Received instructions for my participation in the procedure from the performing physician.  

## 2019-12-19 NOTE — Patient Instructions (Signed)
Handouts Provided:  Polyps  YOU HAD AN ENDOSCOPIC PROCEDURE TODAY AT THE [facility]:   Refer to the procedure report that was given to you for any specific questions about what was found during the examination.  If the procedure report does not answer your questions, please call your gastroenterologist to clarify.  If you requested that your care partner not be given the details of your procedure findings, then the procedure report has been included in a sealed envelope for you to review at your convenience later.  YOU SHOULD EXPECT: Some feelings of bloating in the abdomen. Passage of more gas than usual.  Walking can help get rid of the air that was put into your GI tract during the procedure and reduce the bloating. If you had a lower endoscopy (such as a colonoscopy or flexible sigmoidoscopy) you may notice spotting of blood in your stool or on the toilet paper. If you underwent a bowel prep for your procedure, you may not have a normal bowel movement for a few days.  Please Note:  You might notice some irritation and congestion in your nose or some drainage.  This is from the oxygen used during your procedure.  There is no need for concern and it should clear up in a day or so.  SYMPTOMS TO REPORT IMMEDIATELY:   Following lower endoscopy (colonoscopy or flexible sigmoidoscopy):  Excessive amounts of blood in the stool  Significant tenderness or worsening of abdominal pains  Swelling of the abdomen that is new, acute  Fever of 100F or higher  For urgent or emergent issues, a gastroenterologist can be reached at any hour by calling [phone]. Do not use MyChart messaging for urgent concerns.    DIET:  We do recommend a small meal at first, but then you may proceed to your regular diet.  Drink plenty of fluids but you should avoid alcoholic beverages for 24 hours.  ACTIVITY:  You should plan to take it easy for the rest of today and you should NOT DRIVE or use heavy  machinery until tomorrow (because of the sedation medicines used during the test).    FOLLOW UP: Our staff will call the number listed on your records 48-72 hours following your procedure to check on you and address any questions or concerns that you may have regarding the information given to you following your procedure. If we do not reach you, we will leave a message.  We will attempt to reach you two times.  During this call, we will ask if you have developed any symptoms of COVID 19. If you develop any symptoms (ie: fever, flu-like symptoms, shortness of breath, cough etc.) before then, please call [phone].  If you test positive for Covid 19 in the 2 weeks post procedure, please call and report this information to us.    If any biopsies were taken you will be contacted by phone or by letter within the next 1-3 weeks.  Please call us at [phone] if you have not heard about the biopsies in 3 weeks.    SIGNATURES/CONFIDENTIALITY: You and/or your care partner have signed paperwork which will be entered into your electronic medical record.  These signatures attest to the fact that that the information above on your After Visit Summary has been reviewed and is understood.  Full responsibility of the confidentiality of this discharge information lies with you and/or your care-partner.  

## 2019-12-21 ENCOUNTER — Telehealth: Payer: Self-pay

## 2019-12-21 ENCOUNTER — Encounter: Payer: Self-pay | Admitting: Gastroenterology

## 2019-12-21 NOTE — Telephone Encounter (Signed)
  Follow up Call-  Call back number 12/19/2019  Post procedure Call Back phone  # [phone]  Permission to leave phone message Yes  Some recent data might be hidden     Patient questions:  Do you have a fever, pain , or abdominal swelling? No. Pain Score  0 *  Have you tolerated food without any problems? Yes.    Have you been able to return to your normal activities? Yes.    Do you have any questions about your discharge instructions: Diet   No. Medications  No. Follow up visit  No.  Do you have questions or concerns about your Care? No.  Actions: * If pain score is 4 or above: 1. No action needed, pain <4.Have you developed a fever since your procedure? no  2.   Have you had an respiratory symptoms (SOB or cough) since your procedure? no  3.   Have you tested positive for COVID 19 since your procedure no  4.   Have you had any family members/close contacts diagnosed with the COVID 19 since your procedure?  no   If yes to any of these questions please route to Joylene John, RN and Erenest Rasher, RN

## 2019-12-21 NOTE — Telephone Encounter (Signed)
No answer, left message to call back later today, B.Evaleigh Mccamy RN. 

## 2020-06-19 ENCOUNTER — Other Ambulatory Visit: Payer: Self-pay | Admitting: Nurse Practitioner

## 2020-10-01 ENCOUNTER — Other Ambulatory Visit: Payer: Self-pay | Admitting: Nurse Practitioner

## 2020-10-01 DIAGNOSIS — Z1231 Encounter for screening mammogram for malignant neoplasm of breast: Secondary | ICD-10-CM

## 2020-10-15 ENCOUNTER — Other Ambulatory Visit: Payer: Self-pay | Admitting: Nurse Practitioner

## 2020-10-15 DIAGNOSIS — Z1231 Encounter for screening mammogram for malignant neoplasm of breast: Secondary | ICD-10-CM

## 2020-10-31 ENCOUNTER — Other Ambulatory Visit: Payer: Self-pay | Admitting: Nurse Practitioner

## 2020-10-31 DIAGNOSIS — I10 Essential (primary) hypertension: Secondary | ICD-10-CM

## 2020-11-09 ENCOUNTER — Other Ambulatory Visit: Payer: Self-pay

## 2020-11-09 ENCOUNTER — Encounter: Payer: Self-pay | Admitting: Nurse Practitioner

## 2020-11-09 ENCOUNTER — Ambulatory Visit (INDEPENDENT_AMBULATORY_CARE_PROVIDER_SITE_OTHER): Payer: Commercial Managed Care - PPO | Admitting: Nurse Practitioner

## 2020-11-09 VITALS — BP 126/80 | HR 47 | Temp 97.9°F | Ht 64.0 in | Wt 168.2 lb

## 2020-11-09 DIAGNOSIS — Z136 Encounter for screening for cardiovascular disorders: Secondary | ICD-10-CM | POA: Diagnosis not present

## 2020-11-09 DIAGNOSIS — R001 Bradycardia, unspecified: Secondary | ICD-10-CM

## 2020-11-09 DIAGNOSIS — M62838 Other muscle spasm: Secondary | ICD-10-CM

## 2020-11-09 DIAGNOSIS — M5137 Other intervertebral disc degeneration, lumbosacral region: Secondary | ICD-10-CM

## 2020-11-09 DIAGNOSIS — Z0000 Encounter for general adult medical examination without abnormal findings: Secondary | ICD-10-CM

## 2020-11-09 DIAGNOSIS — I10 Essential (primary) hypertension: Secondary | ICD-10-CM | POA: Diagnosis not present

## 2020-11-09 DIAGNOSIS — Z13220 Encounter for screening for lipoid disorders: Secondary | ICD-10-CM

## 2020-11-09 DIAGNOSIS — Z0001 Encounter for general adult medical examination with abnormal findings: Secondary | ICD-10-CM | POA: Diagnosis not present

## 2020-11-09 LAB — COMPREHENSIVE METABOLIC PANEL
ALT: 14 U/L (ref 0–35)
AST: 19 U/L (ref 0–37)
Albumin: 4.2 g/dL (ref 3.5–5.2)
Alkaline Phosphatase: 45 U/L (ref 39–117)
BUN: 22 mg/dL (ref 6–23)
CO2: 33 mEq/L — ABNORMAL HIGH (ref 19–32)
Calcium: 9.6 mg/dL (ref 8.4–10.5)
Chloride: 102 mEq/L (ref 96–112)
Creatinine, Ser: 1 mg/dL (ref 0.40–1.20)
GFR: 65.27 mL/min (ref >60.00)
Glucose, Bld: 88 mg/dL (ref 70–99)
Potassium: 4.8 mEq/L (ref 3.5–5.1)
Sodium: 141 mEq/L (ref 135–145)
Total Bilirubin: 0.6 mg/dL (ref 0.2–1.2)
Total Protein: 6.9 g/dL (ref 6.0–8.3)

## 2020-11-09 LAB — LIPID PANEL
Cholesterol: 167 mg/dL (ref 0–200)
HDL: 61.6 mg/dL (ref >39.00)
LDL Cholesterol: 93 mg/dL (ref 0–99)
NonHDL: 105.66
Total CHOL/HDL Ratio: 3
Triglycerides: 61 mg/dL (ref 0.0–149.0)
VLDL: 12.2 mg/dL (ref 0.0–40.0)

## 2020-11-09 LAB — CBC
HCT: 43.2 % (ref 36.0–46.0)
Hemoglobin: 14 g/dL (ref 12.0–15.0)
MCHC: 32.4 g/dL (ref 30.0–36.0)
MCV: 82.9 fl (ref 78.0–100.0)
Platelets: 210 10*3/uL (ref 150.0–400.0)
RBC: 5.21 Mil/uL — ABNORMAL HIGH (ref 3.87–5.11)
RDW: 13.8 % (ref 11.5–15.5)
WBC: 5.6 10*3/uL (ref 4.0–10.5)

## 2020-11-09 LAB — TSH: TSH: 2.82 u[IU]/mL (ref 0.35–4.50)

## 2020-11-09 MED ORDER — CYCLOBENZAPRINE HCL 10 MG PO TABS
10.0000 mg | ORAL_TABLET | Freq: Every evening | ORAL | 0 refills | Status: DC | PRN
Start: 2020-11-09 — End: 2021-02-27

## 2020-11-09 MED ORDER — IBUPROFEN 400 MG PO TABS: 600.0000 mg | ORAL_TABLET | Freq: Three times a day (TID) | ORAL | Status: AC | PRN

## 2020-11-09 NOTE — Patient Instructions (Addendum)
Go to lab for blood draw.  Preventive Care 80-52 Years Old, Female Preventive care refers to lifestyle choices and visits with your health care provider that can promote health and wellness. This includes:  A yearly physical exam. This is also called an annual wellness visit.  Regular dental and eye exams.  Immunizations.  Screening for certain conditions.  Healthy lifestyle choices, such as: ? Eating a healthy diet. ? Getting regular exercise. ? Not using drugs or products that contain nicotine and tobacco. ? Limiting alcohol use. What can I expect for my preventive care visit? Physical exam Your health care provider will check your:  Height and weight. These may be used to calculate your BMI (body mass index). BMI is a measurement that tells if you are at a healthy weight.  Heart rate and blood pressure.  Body temperature.  Skin for abnormal spots. Counseling Your health care provider may ask you questions about your:  Past medical problems.  Family's medical history.  Alcohol, tobacco, and drug use.  Emotional well-being.  Home life and relationship well-being.  Sexual activity.  Diet, exercise, and sleep habits.  Work and work Statistician.  Access to firearms.  Method of birth control.  Menstrual cycle.  Pregnancy history. What immunizations do I need? Vaccines are usually given at various ages, according to a schedule. Your health care provider will recommend vaccines for you based on your age, medical history, and lifestyle or other factors, such as travel or where you work.   What tests do I need? Blood tests  Lipid and cholesterol levels. These may be checked every 5 years, or more often if you are over 50 years old.  Hepatitis C test.  Hepatitis B test. Screening  Lung cancer screening. You may have this screening every year starting at age 58 if you have a 30-pack-year history of smoking and currently smoke or have quit within the past 15  years.  Colorectal cancer screening. ? All adults should have this screening starting at age 72 and continuing until age 79. ? Your health care provider may recommend screening at age 19 if you are at increased risk. ? You will have tests every 1-10 years, depending on your results and the type of screening test.  Diabetes screening. ? This is done by checking your blood sugar (glucose) after you have not eaten for a while (fasting). ? You may have this done every 1-3 years.  Mammogram. ? This may be done every 1-2 years. ? Talk with your health care provider about when you should start having regular mammograms. This may depend on whether you have a family history of breast cancer.  BRCA-related cancer screening. This may be done if you have a family history of breast, ovarian, tubal, or peritoneal cancers.  Pelvic exam and Pap test. ? This may be done every 3 years starting at age 76. ? Starting at age 48, this may be done every 5 years if you have a Pap test in combination with an HPV test. Other tests  STD (sexually transmitted disease) testing, if you are at risk.  Bone density scan. This is done to screen for osteoporosis. You may have this scan if you are at high risk for osteoporosis. Talk with your health care provider about your test results, treatment options, and if necessary, the need for more tests. Follow these instructions at home: Eating and drinking  Eat a diet that includes fresh fruits and vegetables, whole grains, lean protein, and low-fat dairy  products.  Take vitamin and mineral supplements as recommended by your health care provider.  Do not drink alcohol if: ? Your health care provider tells you not to drink. ? You are pregnant, may be pregnant, or are planning to become pregnant.  If you drink alcohol: ? Limit how much you have to 0-1 drink a day. ? Be aware of how much alcohol is in your drink. In the [place] one drink equals one 12 oz bottle of beer  (355 mL), one 5 oz glass of wine (148 mL), or one 1 oz glass of hard liquor (44 mL).   Lifestyle  Take daily care of your teeth and gums. Brush your teeth every morning and night with fluoride toothpaste. Floss one time each day.  Stay active. Exercise for at least 30 minutes 5 or more days each week.  Do not use any products that contain nicotine or tobacco, such as cigarettes, e-cigarettes, and chewing tobacco. If you need help quitting, ask your health care provider.  Do not use drugs.  If you are sexually active, practice safe sex. Use a condom or other form of protection to prevent STIs (sexually transmitted infections).  If you do not wish to become pregnant, use a form of birth control. If you plan to become pregnant, see your health care provider for a prepregnancy visit.  If told by your health care provider, take low-dose aspirin daily starting at age 25.  Find healthy ways to cope with stress, such as: ? Meditation, yoga, or listening to music. ? Journaling. ? Talking to a trusted person. ? Spending time with friends and family. Safety  Always wear your seat belt while driving or riding in a vehicle.  Do not drive: ? If you have been drinking alcohol. Do not ride with someone who has been drinking. ? When you are tired or distracted. ? While texting.  Wear a helmet and other protective equipment during sports activities.  If you have firearms in your house, make sure you follow all gun safety procedures. What's next?  Visit your health care provider once a year for an annual wellness visit.  Ask your health care provider how often you should have your eyes and teeth checked.  Stay up to date on all vaccines. This information is not intended to replace advice given to you by your health care provider. Make sure you discuss any questions you have with your health care provider. Document Revised: 05/22/2020 Document Reviewed: 04/29/2018 Elsevier Patient Education   2021 [employer].

## 2020-11-09 NOTE — Progress Notes (Signed)
Subjective:    Patient ID: Tanya Carr, female    DOB: August 13, 1971, 50 y.o.   MRN: [id number]  Patient presents today for CPE and eval of chronic conditions  HPI HTN (hypertension) BP at goal with amlodipine and lisinopril. BP Readings from Last 3 Encounters:  11/09/20 126/80  12/19/19 122/70  11/08/19 122/80   Continue current medication  DDD (degenerative disc disease), lumbosacral Reports hx of disc herniation, surgical intervention completed. No improvement with injections in past. Some improvement with PT post surgery. She has continued home stretching exercises She continues to experience intermittent back pain with left side radiculopathy. Worse in supine position No weakness, no saddle paresthesia.  Declined referral to PT Ordered flexeril at hs and NSAID OTC prn Consider referral to ortho if no improvement  Sinus bradycardia Continuous decline in HR in last 2years: 63 to 47. Intermittent palpitation and fatigue with exertion. Normal echo and holter monitor in 2019.  Referred to cardiology  Sexual History (orientation,birth control, marital status, STD):deferred pelvic and breast exam to GYN  Depression/Suicide: Depression screen [facility] 2/9 11/09/2020 11/08/2019 10/25/2018 10/20/2017 08/15/2015 10/14/2013  Decreased Interest 0 0 0 0 0 0  Down, Depressed, Hopeless 0 0 0 0 0 0  PHQ - 2 Score 0 0 0 0 0 0   Vision:up to date Exercise: cardio and resistance training 3-4x/week Dental:up to date  Immunizations: (TDAP, Hep C screen, Pneumovax, Influenza, zoster)  Health Maintenance  Topic Date Due  . COVID-19 Vaccine (3 - Booster for Pfizer series) 06/11/2020  . Flu Shot  11/29/2020*  .  Hepatitis C: One time screening is recommended by Center for Disease Control  (CDC) for  adults born from [birth year] through 1967.   11/09/2021*  . Mammogram  11/07/2021  . Tetanus Vaccine  09/04/2022  . Colon Cancer Screening  12/19/2026  . HIV Screening  Completed  . HPV Vaccine   Aged Out  *Topic was postponed. The date shown is not the original due date.   Diet:heart healthy Weight:  Wt Readings from Last 3 Encounters:  11/09/20 168 lb 3.2 oz (76.3 kg)  12/19/19 186 lb 6.4 oz (84.6 kg)  11/29/19 186 lb 6.4 oz (84.6 kg)   Fall Risk: Fall Risk  10/25/2018 10/20/2017 10/14/2013  Falls in the past year? 0 No No  Follow up Falls evaluation completed - -   Medications and allergies reviewed with patient and updated if appropriate.  Patient Active Problem List   Diagnosis Date Noted  . Sinus bradycardia 11/12/2020  . HTN (hypertension) 10/20/2017  . Anxiety 03/06/2017  . DDD (degenerative disc disease), lumbosacral 09/24/2016  . Vitamin D deficiency 07/11/2009    Current Outpatient Medications on File Prior to Visit  Medication Sig Dispense Refill  . amLODipine (NORVASC) 5 MG tablet Take 1 tablet (5 mg total) by mouth daily. 90 tablet 3  . lisinopril (ZESTRIL) 30 MG tablet TAKE 1 TABLET BY MOUTH DAILY. 30 tablet 0   No current facility-administered medications on file prior to visit.   Past Medical History:  Diagnosis Date  . Allergy    seasonal  . Fainting episodes 2011   history of with blood draw, mammogram, post op 2011  . Headache(784.0)    history of migraines  . Hypertension    on lisinopril-will do ekg today  . Palpitations   . PONV (postoperative nausea and vomiting)    vagal response per pt   Past Surgical History:  Procedure Laterality Date  . ABDOMINAL HYSTERECTOMY    .  LUMBAR DISC SURGERY    . ulnar nerve decompression  2011   Social History   Socioeconomic History  . Marital status: Single    Spouse name: Not on file  . Number of children: Not on file  . Years of education: Not on file  . Highest education level: Not on file  Occupational History  . Not on file  Tobacco Use  . Smoking status: Former Smoker    Quit date: 11/16/1996    Years since quitting: 24.0  . Smokeless tobacco: Never Used  Vaping Use  . Vaping Use:  Never used  Substance and Sexual Activity  . Alcohol use: Yes    Comment: rare  . Drug use: No  . Sexual activity: Not on file  Other Topics Concern  . Not on file  Social History Narrative   G0   Garment/textile technologist   Social Determinants of Health   Financial Resource Strain: Not on file  Food Insecurity: Not on file  Transportation Needs: Not on file  Physical Activity: Not on file  Stress: Not on file  Social Connections: Not on file    Family History  Problem Relation Age of Onset  . Cancer Mother   . Multiple sclerosis Mother   . COPD Father   . Pulmonary embolism Brother   . Colon cancer Neg Hx   . Colon polyps Neg Hx   . Esophageal cancer Neg Hx   . Rectal cancer Neg Hx   . Stomach cancer Neg Hx        Review of Systems  Constitutional: Negative for fever, malaise/fatigue and weight loss.  HENT: Negative for congestion and sore throat.   Eyes:       Negative for visual changes  Respiratory: Negative for cough and shortness of breath.   Cardiovascular: Negative for chest pain, palpitations and leg swelling.  Gastrointestinal: Negative for blood in stool, constipation, diarrhea and heartburn.  Genitourinary: Negative for dysuria, frequency and urgency.  Musculoskeletal: Negative for falls, joint pain and myalgias.  Skin: Negative for rash.  Neurological: Negative for dizziness, sensory change and headaches.  Endo/Heme/Allergies: Does not bruise/bleed easily.  Psychiatric/Behavioral: Negative for depression, substance abuse and suicidal ideas. The patient is not nervous/anxious.    Objective:   Vitals:   11/09/20 0820  BP: 126/80  Pulse: (!) 47  Temp: 97.9 F (36.6 C)  SpO2: 99%   Body mass index is 28.87 kg/m.  Physical Examination:  Physical Exam Vitals reviewed.  Constitutional:      General: She is not in acute distress.    Appearance: She is well-developed.  HENT:     Right Ear: Tympanic membrane, ear canal and external ear normal.     Left  Ear: Tympanic membrane, ear canal and external ear normal.  Eyes:     Extraocular Movements: Extraocular movements intact.     Conjunctiva/sclera: Conjunctivae normal.     Pupils: Pupils are equal, round, and reactive to light.  Cardiovascular:     Rate and Rhythm: Normal rate and regular rhythm.     Pulses: Normal pulses.     Heart sounds: Normal heart sounds.  Pulmonary:     Effort: Pulmonary effort is normal. No respiratory distress.     Breath sounds: Normal breath sounds.  Chest:     Chest wall: No tenderness.  Abdominal:     General: Bowel sounds are normal.     Palpations: Abdomen is soft.  Genitourinary:    Comments: Deferred breast and  pelvic exam to GYN Musculoskeletal:        General: Normal range of motion.  Skin:    General: Skin is warm and dry.  Neurological:     Mental Status: She is alert and oriented to person, place, and time.     Deep Tendon Reflexes: Reflexes are normal and symmetric.  Psychiatric:        Mood and Affect: Mood normal.        Behavior: Behavior normal.        Thought Content: Thought content normal.    ASSESSMENT and PLAN: This visit occurred during the SARS-CoV-2 public health emergency.  Safety protocols were in place, including screening questions prior to the visit, additional usage of staff PPE, and extensive cleaning of exam room while observing appropriate contact time as indicated for disinfecting solutions.   Zeniyah was seen today for annual exam.  Diagnoses and all orders for this visit:  Preventative health care -     CBC -     Comprehensive metabolic panel -     Lipid panel  Primary hypertension  Encounter for lipid screening for cardiovascular disease -     Lipid panel  Sinus bradycardia -     TSH -     Ambulatory referral to Cardiology  DDD (degenerative disc disease), lumbosacral -     cyclobenzaprine (FLEXERIL) 10 MG tablet; Take 1 tablet (10 mg total) by mouth at bedtime as needed for muscle spasms. -      ibuprofen (ADVIL) 400 MG tablet; Take 1.5 tablets (600 mg total) by mouth every 8 (eight) hours as needed.      Problem List Items Addressed This Visit      Cardiovascular and Mediastinum   HTN (hypertension)    BP at goal with amlodipine and lisinopril. BP Readings from Last 3 Encounters:  11/09/20 126/80  12/19/19 122/70  11/08/19 122/80   Continue current medication      Sinus bradycardia    Continuous decline in HR in last 2years: 63 to 47. Intermittent palpitation and fatigue with exertion. Normal echo and holter monitor in 2019.  Referred to cardiology      Relevant Orders   TSH (Completed)   Ambulatory referral to Cardiology     Musculoskeletal and Integument   DDD (degenerative disc disease), lumbosacral    Reports hx of disc herniation, surgical intervention completed. No improvement with injections in past. Some improvement with PT post surgery. She has continued home stretching exercises She continues to experience intermittent back pain with left side radiculopathy. Worse in supine position No weakness, no saddle paresthesia.  Declined referral to PT Ordered flexeril at hs and NSAID OTC prn Consider referral to ortho if no improvement      Relevant Medications   cyclobenzaprine (FLEXERIL) 10 MG tablet   ibuprofen (ADVIL) 400 MG tablet    Other Visit Diagnoses    Preventative health care    -  Primary   Relevant Orders   CBC (Completed)   Comprehensive metabolic panel (Completed)   Lipid panel (Completed)   Encounter for lipid screening for cardiovascular disease       Relevant Orders   Lipid panel (Completed)      Follow up: Return in about 6 months (around 05/12/2021) for HTN.  Wilfred Lacy, NP

## 2020-11-12 ENCOUNTER — Encounter: Payer: Self-pay | Admitting: Nurse Practitioner

## 2020-11-12 DIAGNOSIS — R001 Bradycardia, unspecified: Secondary | ICD-10-CM | POA: Insufficient documentation

## 2020-11-12 NOTE — Assessment & Plan Note (Addendum)
Reports hx of disc herniation, surgical intervention completed. No improvement with injections in past. Some improvement with PT post surgery. She has continued home stretching exercises She continues to experience intermittent back pain with left side radiculopathy. Worse in supine position No weakness, no saddle paresthesia.  Declined referral to PT Ordered flexeril at hs and NSAID OTC prn Consider referral to ortho if no improvement

## 2020-11-12 NOTE — Assessment & Plan Note (Addendum)
Continuous decline in HR in last 2years: 63 to 47. Intermittent palpitation and fatigue with exertion. Normal echo and holter monitor in 2019.  Referred to cardiology

## 2020-11-12 NOTE — Assessment & Plan Note (Signed)
BP at goal with amlodipine and lisinopril. BP Readings from Last 3 Encounters:  11/09/20 126/80  12/19/19 122/70  11/08/19 122/80   Continue current medication

## 2020-11-15 ENCOUNTER — Ambulatory Visit
Admission: RE | Admit: 2020-11-15 | Discharge: 2020-11-15 | Disposition: A | Discharge status: Home / Self Care | Payer: Commercial Managed Care - PPO | Source: Ambulatory Visit

## 2020-11-15 ENCOUNTER — Other Ambulatory Visit: Payer: Self-pay

## 2020-11-15 DIAGNOSIS — Z1231 Encounter for screening mammogram for malignant neoplasm of breast: Secondary | ICD-10-CM

## 2020-11-29 ENCOUNTER — Other Ambulatory Visit: Payer: Self-pay | Admitting: Nurse Practitioner

## 2020-11-29 ENCOUNTER — Ambulatory Visit: Payer: Commercial Managed Care - PPO | Admitting: Internal Medicine

## 2020-11-29 ENCOUNTER — Other Ambulatory Visit: Payer: Self-pay

## 2020-11-29 VITALS — BP 144/80 | HR 47 | Ht 64.0 in | Wt 170.8 lb

## 2020-11-29 DIAGNOSIS — R079 Chest pain, unspecified: Secondary | ICD-10-CM

## 2020-11-29 DIAGNOSIS — I10 Essential (primary) hypertension: Secondary | ICD-10-CM

## 2020-11-29 DIAGNOSIS — R001 Bradycardia, unspecified: Secondary | ICD-10-CM

## 2020-11-29 NOTE — Progress Notes (Signed)
Cardiology Office Note:    Date:  11/29/2020   ID:  Tanya Carr, DOB [birth year], MRN [id number]  PCP:  Flossie Buffy, NP  Cardiologist:  Elouise Munroe, MD  Electrophysiologist:  None   Referring MD: Flossie Buffy, NP   Chief Complaint/Reason for Referral: Bradycardia, chest pressure  History of Present Illness:    Tanya Carr is a 50 y.o. female with a history of likely neurocardiogenic syncope, hypertension, headaches who presents today for bradycardia and chest pressure.  I last visited with the patient in 2019.  We performed an echocardiogram and a cardiac monitor.  Echocardiogram was normal.  Cardiac monitor showed infrequent ventricular and supraventricular ectopy, with associated symptoms.  Due to underlying bradycardia, we deferred on initiating metoprolol.  She has been doing well overall and has returned to [facility] for exercise.  She is exercising without significant chest pain, shortness of breath or fatigue.  She does however notice she will randomly have chest pressure, and can occur with minimal exertion such as walking in the grocery store.  She also notices fluttering in her chest which is likely her known ectopic beats.  When her heart rate is quite low she does have fatigue.  She is able to exercise to a high level at her gym class, and we have previously not suspected chronotropic incompetence.  We discussed exercise treadmill testing for objective data on her exercise capabilities and provocation of chest pain.  She would like to proceed with this.   Past Medical History:  Diagnosis Date  . Allergy    seasonal  . Fainting episodes 2011   history of with blood draw, mammogram, post op 2011  . Headache(784.0)    history of migraines  . Hypertension    on lisinopril-will do ekg today  . Palpitations   . PONV (postoperative nausea and vomiting)    vagal response per pt    Past Surgical History:  Procedure Laterality Date  .  ABDOMINAL HYSTERECTOMY    . LUMBAR DISC SURGERY    . ulnar nerve decompression  2011    Current Medications: Current Meds  Medication Sig  . amLODipine (NORVASC) 5 MG tablet Take 1 tablet (5 mg total) by mouth daily.  . cyclobenzaprine (FLEXERIL) 10 MG tablet Take 1 tablet (10 mg total) by mouth at bedtime as needed for muscle spasms.  Marland Kitchen ibuprofen (ADVIL) 400 MG tablet Take 1.5 tablets (600 mg total) by mouth every 8 (eight) hours as needed.  Marland Kitchen lisinopril (ZESTRIL) 30 MG tablet TAKE 1 TABLET BY MOUTH DAILY.     Allergies:   Bee pollen, Food, Shellfish allergy, Vit d-vit e-safflower oil, and Vitamin d analogs   Social History   Tobacco Use  . Smoking status: Former Smoker    Quit date: 11/16/1996    Years since quitting: 24.0  . Smokeless tobacco: Never Used  Vaping Use  . Vaping Use: Never used  Substance Use Topics  . Alcohol use: Yes    Comment: rare  . Drug use: No     Family History: The patient's family history includes COPD in her father; Cancer in her mother; Multiple sclerosis in her mother; Pulmonary embolism in her brother. There is no history of Colon cancer, Colon polyps, Esophageal cancer, Rectal cancer, or Stomach cancer.  ROS:   Please see the history of present illness.    All other systems reviewed and are negative.  EKGs/Labs/Other Studies Reviewed:    The following studies were reviewed  today:  EKG: Sinus bradycardia, sinus arrhythmia, ventricular rate 47  Recent Labs: 11/09/2020: ALT 14; BUN 22; Creatinine, Ser 1.00; Hemoglobin 14.0; Platelets 210.0; Potassium 4.8; Sodium 141; TSH 2.82  Recent Lipid Panel    Component Value Date/Time   CHOL 167 11/09/2020 0851   TRIG 61.0 11/09/2020 0851   HDL 61.60 11/09/2020 0851   CHOLHDL 3 11/09/2020 0851   VLDL 12.2 11/09/2020 0851   LDLCALC 93 11/09/2020 0851    Physical Exam:    VS:  BP (!) 144/80   Pulse (!) 47   Ht 5\' 4"  (1.626 m)   Wt 170 lb 12.8 oz (77.5 kg)   LMP 11/10/2011   BMI 29.32  kg/m     Wt Readings from Last 5 Encounters:  11/29/20 170 lb 12.8 oz (77.5 kg)  11/09/20 168 lb 3.2 oz (76.3 kg)  12/19/19 186 lb 6.4 oz (84.6 kg)  11/29/19 186 lb 6.4 oz (84.6 kg)  11/08/19 192 lb 1.6 oz (87.1 kg)    Constitutional: No acute distress Eyes: sclera non-icteric, normal conjunctiva and lids ENMT: normal dentition, moist mucous membranes Cardiovascular: regular rhythm, bradycardic rate, no murmurs. S1 and S2 normal. Radial pulses normal bilaterally. No jugular venous distention.  Respiratory: clear to auscultation bilaterally GI : normal bowel sounds, soft and nontender. No distention.   MSK: extremities warm, well perfused. No edema.  NEURO: grossly nonfocal exam, moves all extremities. PSYCH: alert and oriented x 3, normal mood and affect.   ASSESSMENT:    1. Chest pain of uncertain etiology   2. Sinus bradycardia    PLAN:    Chest pain of uncertain etiology - Plan: EKG 12-Lead, EXERCISE TOLERANCE TEST (ETT)  Sinus bradycardia - Plan: EKG 12-Lead, EXERCISE TOLERANCE TEST (ETT)  For exertional chest pressure and evaluation of chronotropic incompetence, we will perform an exercise treadmill test.  Baseline ECG is normal.  If this test is normal and she is able to exercise to a high level like she has been doing, risk of ischemia is quite low.  I have offered a repeat echocardiogram if she feels concerned about her symptoms, we will see how the ETT turns out before ordering an echo per her preference.  Total time of encounter: 30 minutes total time of encounter, including 20 minutes spent in face-to-face patient care on the date of this encounter. This time includes coordination of care and counseling regarding above mentioned problem list. Remainder of non-face-to-face time involved reviewing chart documents/testing relevant to the patient encounter and documentation in the medical record. I have independently reviewed documentation from referring provider.   Cherlynn Kaiser, MD, [facility]    Medication Adjustments/Labs and Tests Ordered: Current medicines are reviewed at length with the patient today.  Concerns regarding medicines are outlined above.   Orders Placed This Encounter  Procedures  . EXERCISE TOLERANCE TEST (ETT)  . EKG 12-Lead    Shared Decision Making/Informed Consent:   Shared Decision Making/Informed Consent The risks [chest pain, shortness of breath, cardiac arrhythmias, dizziness, blood pressure fluctuations, myocardial infarction, stroke/transient ischemic attack, and life-threatening complications (estimated to be 1 in 10,000)], benefits (risk stratification, diagnosing coronary artery disease, treatment guidance) and alternatives of an exercise tolerance test were discussed in detail with Ms. Sprigg and she agrees to proceed.     No orders of the defined types were placed in this encounter.   Patient Instructions  Medication Instructions:  No Changes In Medications at this time.  *If you  need a refill on your cardiac medications before your next appointment, please call your pharmacy*  Lab Work: COVID TEST PRIOR TO EXERCISE TOLERANCE TEST  If you have labs (blood work) drawn today and your tests are completely normal, you will receive your results only by: Marland Kitchen MyChart Message (if you have MyChart) OR . A paper copy in the mail If you have any lab test that is abnormal or we need to change your treatment, we will call you to review the results.  Testing/Procedures: Your physician has requested that you have an exercise tolerance test. For further information please visit [email or web address]. Please also follow instruction sheet, as given. This will take place at [place], [address].   Do not drink or eat foods with caffeine for 24 hours before the test. (Chocolate, coffee, tea, or energy drinks)  If you use an inhaler, bring it with you to the test.  Do not smoke for 4 hours before the  test.  Wear comfortable shoes and clothing.  Follow-Up: At [facility], you and your health needs are our priority.  As part of our continuing mission to provide you with exceptional heart care, we have created designated Provider Care Teams.  These Care Teams include your primary Cardiologist (physician) and Advanced Practice Providers (APPs -  Physician Assistants and Nurse Practitioners) who all work together to provide you with the care you need, when you need it.  Your next appointment:   AFTER TESTING   The format for your next appointment:   In Person  Provider:   Cherlynn Kaiser, MD

## 2020-11-29 NOTE — Patient Instructions (Signed)
Medication Instructions:  No Changes In Medications at this time.  *If you need a refill on your cardiac medications before your next appointment, please call your pharmacy*  Lab Work: COVID TEST PRIOR TO EXERCISE TOLERANCE TEST  If you have labs (blood work) drawn today and your tests are completely normal, you will receive your results only by: Marland Kitchen MyChart Message (if you have MyChart) OR . A paper copy in the mail If you have any lab test that is abnormal or we need to change your treatment, we will call you to review the results.  Testing/Procedures: Your physician has requested that you have an exercise tolerance test. For further information please visit [email or web address]. Please also follow instruction sheet, as given. This will take place at [place], [address].   Do not drink or eat foods with caffeine for 24 hours before the test. (Chocolate, coffee, tea, or energy drinks)  If you use an inhaler, bring it with you to the test.  Do not smoke for 4 hours before the test.  Wear comfortable shoes and clothing.  Follow-Up: At [facility], you and your health needs are our priority.  As part of our continuing mission to provide you with exceptional heart care, we have created designated Provider Care Teams.  These Care Teams include your primary Cardiologist (physician) and Advanced Practice Providers (APPs -  Physician Assistants and Nurse Practitioners) who all work together to provide you with the care you need, when you need it.  Your next appointment:   AFTER TESTING   The format for your next appointment:   In Person  Provider:   Cherlynn Kaiser, MD

## 2020-12-11 ENCOUNTER — Other Ambulatory Visit: Payer: Self-pay | Admitting: Nurse Practitioner

## 2020-12-11 DIAGNOSIS — I10 Essential (primary) hypertension: Secondary | ICD-10-CM

## 2021-01-22 ENCOUNTER — Other Ambulatory Visit: Payer: Self-pay

## 2021-01-22 ENCOUNTER — Ambulatory Visit (INDEPENDENT_AMBULATORY_CARE_PROVIDER_SITE_OTHER): Payer: No Typology Code available for payment source

## 2021-01-22 DIAGNOSIS — R079 Chest pain, unspecified: Secondary | ICD-10-CM | POA: Diagnosis not present

## 2021-01-22 DIAGNOSIS — R001 Bradycardia, unspecified: Secondary | ICD-10-CM | POA: Diagnosis not present

## 2021-01-22 LAB — EXERCISE TOLERANCE TEST
Estimated workload: 12.1 METS
Exercise duration (min): 10 min
Exercise duration (sec): 15 s
MPHR: 169 {beats}/min
Peak HR: 150 {beats}/min
Percent HR: 88 %
RPE: 15
Rest HR: 69 {beats}/min

## 2021-02-08 ENCOUNTER — Other Ambulatory Visit: Payer: Self-pay

## 2021-02-08 ENCOUNTER — Ambulatory Visit (INDEPENDENT_AMBULATORY_CARE_PROVIDER_SITE_OTHER): Payer: No Typology Code available for payment source | Admitting: Internal Medicine

## 2021-02-08 ENCOUNTER — Encounter: Payer: Self-pay | Admitting: Internal Medicine

## 2021-02-08 VITALS — BP 157/97 | HR 63 | Ht 64.0 in | Wt 174.2 lb

## 2021-02-08 DIAGNOSIS — I493 Ventricular premature depolarization: Secondary | ICD-10-CM

## 2021-02-08 DIAGNOSIS — I10 Essential (primary) hypertension: Secondary | ICD-10-CM

## 2021-02-08 DIAGNOSIS — Z79899 Other long term (current) drug therapy: Secondary | ICD-10-CM

## 2021-02-08 DIAGNOSIS — R001 Bradycardia, unspecified: Secondary | ICD-10-CM | POA: Diagnosis not present

## 2021-02-08 DIAGNOSIS — R079 Chest pain, unspecified: Secondary | ICD-10-CM | POA: Diagnosis not present

## 2021-02-08 MED ORDER — HYDROCHLOROTHIAZIDE 12.5 MG PO CAPS: 12.5000 mg | ORAL_CAPSULE | Freq: Every day | ORAL | 3 refills | Status: DC

## 2021-02-08 NOTE — Patient Instructions (Addendum)
Medication Instructions:  Start HCTZ 12.5 mg daily Continue all other medications *If you need a refill on your cardiac medications before your next appointment, please call your pharmacy*   Lab Work: Bmet have done in 7 to 10 days   Testing/Procedures: None ordered   Follow-Up: At [facility], you and your health needs are our priority.  As part of our continuing mission to provide you with exceptional heart care, we have created designated Provider Care Teams.  These Care Teams include your primary Cardiologist (physician) and Advanced Practice Providers (APPs -  Physician Assistants and Nurse Practitioners) who all work together to provide you with the care you need, when you need it.  We recommend signing up for the patient portal called "MyChart".  Sign up information is provided on this After Visit Summary.  MyChart is used to connect with patients for Virtual Visits (Telemedicine).  Patients are able to view lab/test results, encounter notes, upcoming appointments, etc.  Non-urgent messages can be sent to your provider as well.   To learn more about what you can do with MyChart, go to [email or web address].    Your next appointment:  Friday 04/12/21 at 9:00 am   The format for your next appointment: Office   Provider:  Dr.Acharya

## 2021-02-08 NOTE — Progress Notes (Signed)
Cardiology Office Note:    Date:  02/08/2021   ID:  Tanya Carr, DOB 1970-10-23, MRN [id number]  PCP:  Flossie Buffy, NP  Cardiologist:  Elouise Munroe, MD  Electrophysiologist:  None   Referring MD: Flossie Buffy, NP   Chief Complaint/Reason for Referral: Exercise HTN, baseline HTN  History of Present Illness:    Tanya Carr is a 50 y.o. female with a history of likely neurocardiogenic syncope, hypertension, headaches who presents today for bradycardia and chest pressure.   I first visited with the patient in 2019.  We performed an echocardiogram and a cardiac monitor.  Echocardiogram was normal.  Cardiac monitor showed infrequent ventricular and supraventricular ectopy, with associated symptoms.  Due to underlying bradycardia, we deferred on initiating metoprolol.  She has been doing well overall and has returned to [facility] for exercise.  She is exercising without significant chest pain, shortness of breath or fatigue.  She does however notice she will randomly have chest pressure, and can occur with minimal exertion such as walking in the grocery store.  She also notices fluttering in her chest which is likely her known ectopic beats. When her heart rate is quite low she does have fatigue.  She is able to exercise to a high level at her gym class, and we have previously not suspected chronotropic incompetence.  We discussed exercise treadmill testing for objective data on her exercise capabilities and provocation of chest pain.  ETT showed excellent exercise capacity, however there was elevated BP with exercise and frequent exercise PVCs. Overall low risk for ischemia.   Her partner joins her today for the visit. He contributes to the history. The three of [place] discussed indications for further therapy for HTN and continued surveillance of PVCs. She does not have lightheadedness or syncope with exercise PVCs. No chest pain currently or during stress test.    Past Medical History:  Diagnosis Date   Allergy    seasonal   Fainting episodes 2011   history of with blood draw, mammogram, post op 2011   Headache(784.0)    history of migraines   Hypertension    on lisinopril-will do ekg today   Palpitations    PONV (postoperative nausea and vomiting)    vagal response per pt    Past Surgical History:  Procedure Laterality Date   ABDOMINAL HYSTERECTOMY     LUMBAR DISC SURGERY     ulnar nerve decompression  2011    Current Medications: Current Meds  Medication Sig   amLODipine (NORVASC) 5 MG tablet TAKE 1 TABLET BY MOUTH DAILY.   cyclobenzaprine (FLEXERIL) 10 MG tablet Take 1 tablet (10 mg total) by mouth at bedtime as needed for muscle spasms.   ibuprofen (ADVIL) 400 MG tablet Take 1.5 tablets (600 mg total) by mouth every 8 (eight) hours as needed.   lisinopril (ZESTRIL) 30 MG tablet TAKE 1 TABLET BY MOUTH DAILY.     Allergies:   Bee pollen, Food, Shellfish allergy, Vit d-vit e-safflower oil, and Vitamin d analogs   Social History   Tobacco Use   Smoking status: Former    Pack years: 0.00    Types: Cigarettes    Quit date: 11/16/1996    Years since quitting: 24.2   Smokeless tobacco: Never  Vaping Use   Vaping Use: Never used  Substance Use Topics   Alcohol use: Yes    Comment: rare   Drug use: No     Family History: The patient's  family history includes COPD in her father; Cancer in her mother; Multiple sclerosis in her mother; Pulmonary embolism in her brother. There is no history of Colon cancer, Colon polyps, Esophageal cancer, Rectal cancer, or Stomach cancer.  ROS:   Please see the history of present illness.    All other systems reviewed and are negative.  EKGs/Labs/Other Studies Reviewed:    The following studies were reviewed today:   Recent Labs: 11/09/2020: ALT 14; BUN 22; Creatinine, Ser 1.00; Hemoglobin 14.0; Platelets 210.0; Potassium 4.8; Sodium 141; TSH 2.82  Recent Lipid Panel    Component  Value Date/Time   CHOL 167 11/09/2020 0851   TRIG 61.0 11/09/2020 0851   HDL 61.60 11/09/2020 0851   CHOLHDL 3 11/09/2020 0851   VLDL 12.2 11/09/2020 0851   LDLCALC 93 11/09/2020 0851    Physical Exam:    VS:  BP (!) 157/97   Pulse 63   Ht 5\' 4"  (1.626 m)   Wt 174 lb 3.2 oz (79 kg)   LMP 11/10/2011   SpO2 97%   BMI 29.90 kg/m     Wt Readings from Last 5 Encounters:  02/08/21 174 lb 3.2 oz (79 kg)  11/29/20 170 lb 12.8 oz (77.5 kg)  11/09/20 168 lb 3.2 oz (76.3 kg)  12/19/19 186 lb 6.4 oz (84.6 kg)  11/29/19 186 lb 6.4 oz (84.6 kg)    Constitutional: No acute distress Eyes: sclera non-icteric, normal conjunctiva and lids ENMT: normal dentition, moist mucous membranes Cardiovascular: regular rhythm, normal rate, no murmurs. S1 and S2 normal. Radial pulses normal bilaterally. No jugular venous distention.  Respiratory: clear to auscultation bilaterally GI : normal bowel sounds, soft and nontender. No distention.   MSK: extremities warm, well perfused. No edema.  NEURO: grossly nonfocal exam, moves all extremities. PSYCH: alert and oriented x 3, normal mood and affect.   ASSESSMENT:    1. Primary hypertension   2. Sinus bradycardia   3. Chest pain of uncertain etiology   4. Hypertensive response to exercise   5. PVC's (premature ventricular contractions)   6. Medication management    PLAN:    Primary hypertension - Plan: Basic metabolic panel - already on amlodipine and lisinopril, discussed options for starting HCTZ vs spironolactone for additional therapy. If BP better controlled during exercise, query if PVC burden may go down. In a few months, we should repeat a 3 day monitor for PVC burden and timing. Will start HCTZ 12.5 mg daily and titrate in a 6-8 weeks if BP still elevated, her home readings have been high as well.   Sinus bradycardia - overall stable. No chronotropic incompetence on ETT.  Chest pain of uncertain etiology - no worrisome findings on  ETT.  Hypertensive response to exercise - as above.   PVC's (premature ventricular contractions) - will quantitate again after better BP control. No signs of ischemia otherwise.   Total time of encounter: 46 minutes total time of encounter, including 30 minutes spent in face-to-face patient care on the date of this encounter. This time includes coordination of care and counseling regarding above mentioned problem list. Remainder of non-face-to-face time involved reviewing chart documents/testing relevant to the patient encounter and documentation in the medical record. I have independently reviewed documentation from referring provider.   Cherlynn Kaiser, MD, [facility]     Medication Adjustments/Labs and Tests Ordered: Current medicines are reviewed at length with the patient today.  Concerns regarding medicines are outlined above.   Orders  Placed This Encounter  Procedures   Basic metabolic panel    Meds ordered this encounter  Medications   hydrochlorothiazide (MICROZIDE) 12.5 MG capsule    Sig: Take 1 capsule (12.5 mg total) by mouth daily.    Dispense:  90 capsule    Refill:  3    Patient Instructions  Medication Instructions:  Start HCTZ 12.5 mg daily Continue all other medications *If you need a refill on your cardiac medications before your next appointment, please call your pharmacy*   Lab Work: Bmet have done in 7 to 10 days   Testing/Procedures: None ordered   Follow-Up: At [facility], you and your health needs are our priority.  As part of our continuing mission to provide you with exceptional heart care, we have created designated Provider Care Teams.  These Care Teams include your primary Cardiologist (physician) and Advanced Practice Providers (APPs -  Physician Assistants and Nurse Practitioners) who all work together to provide you with the care you need, when you need it.  We recommend signing up for the patient portal  called "MyChart".  Sign up information is provided on this After Visit Summary.  MyChart is used to connect with patients for Virtual Visits (Telemedicine).  Patients are able to view lab/test results, encounter notes, upcoming appointments, etc.  Non-urgent messages can be sent to your provider as well.   To learn more about what you can do with MyChart, go to [email or web address].    Your next appointment:  Friday 04/12/21 at 9:00 am   The format for your next appointment: Office   Provider:  Dr.Nayzeth Altman

## 2021-02-18 LAB — BASIC METABOLIC PANEL
BUN/Creatinine Ratio: 25 — ABNORMAL HIGH (ref 9–23)
BUN: 20 mg/dL (ref 6–24)
CO2: 25 mmol/L (ref 20–29)
Calcium: 9.9 mg/dL (ref 8.7–10.2)
Chloride: 94 mmol/L — ABNORMAL LOW (ref 96–106)
Creatinine, Ser: 0.8 mg/dL (ref 0.57–1.00)
Glucose: 77 mg/dL (ref 65–99)
Potassium: 3.8 mmol/L (ref 3.5–5.2)
Sodium: 136 mmol/L (ref 134–144)
eGFR: 89 mL/min/{1.73_m2} (ref >59)

## 2021-02-21 ENCOUNTER — Other Ambulatory Visit: Payer: Self-pay | Admitting: Nurse Practitioner

## 2021-02-21 DIAGNOSIS — M5137 Other intervertebral disc degeneration, lumbosacral region: Secondary | ICD-10-CM

## 2021-02-26 DIAGNOSIS — Z79899 Other long term (current) drug therapy: Secondary | ICD-10-CM

## 2021-02-27 NOTE — Telephone Encounter (Signed)
30 tabs sent. Needs f/u w/ provider prior to next refill

## 2021-03-13 MED ORDER — CHLORTHALIDONE 25 MG PO TABS: 25.0000 mg | ORAL_TABLET | Freq: Every day | ORAL | 3 refills | Status: DC

## 2021-03-29 LAB — BASIC METABOLIC PANEL
BUN/Creatinine Ratio: 26 — ABNORMAL HIGH (ref 9–23)
BUN: 22 mg/dL (ref 6–24)
CO2: 27 mmol/L (ref 20–29)
Calcium: 9.8 mg/dL (ref 8.7–10.2)
Chloride: 92 mmol/L — ABNORMAL LOW (ref 96–106)
Creatinine, Ser: 0.86 mg/dL (ref 0.57–1.00)
Glucose: 80 mg/dL (ref 65–99)
Potassium: 4.1 mmol/L (ref 3.5–5.2)
Sodium: 136 mmol/L (ref 134–144)
eGFR: 82 mL/min/{1.73_m2} (ref >59)

## 2021-04-12 ENCOUNTER — Ambulatory Visit (INDEPENDENT_AMBULATORY_CARE_PROVIDER_SITE_OTHER): Payer: No Typology Code available for payment source | Admitting: Internal Medicine

## 2021-04-12 ENCOUNTER — Other Ambulatory Visit: Payer: Self-pay

## 2021-04-12 VITALS — BP 120/86 | HR 64 | Ht 64.0 in | Wt 178.0 lb

## 2021-04-12 DIAGNOSIS — R001 Bradycardia, unspecified: Secondary | ICD-10-CM | POA: Diagnosis not present

## 2021-04-12 DIAGNOSIS — I493 Ventricular premature depolarization: Secondary | ICD-10-CM | POA: Diagnosis not present

## 2021-04-12 DIAGNOSIS — Z79899 Other long term (current) drug therapy: Secondary | ICD-10-CM

## 2021-04-12 DIAGNOSIS — I10 Essential (primary) hypertension: Secondary | ICD-10-CM

## 2021-04-12 MED ORDER — CARVEDILOL 3.125 MG PO TABS: 3.1250 mg | ORAL_TABLET | Freq: Two times a day (BID) | ORAL | 3 refills | Status: DC

## 2021-04-12 NOTE — Patient Instructions (Signed)
Medication Instructions:  START: CARVEDILOL 3.'125mg'$  TWICE DAILY  *If you need a refill on your cardiac medications before your next appointment, please call your pharmacy*  Follow-Up: At [facility], you and your health needs are our priority.  As part of our continuing mission to provide you with exceptional heart care, we have created designated Provider Care Teams.  These Care Teams include your primary Cardiologist (physician) and Advanced Practice Providers (APPs -  Physician Assistants and Nurse Practitioners) who all work together to provide you with the care you need, when you need it.  Your next appointment:   3 month(s)  The format for your next appointment:   In Person  Provider:   Cherlynn Kaiser, MD  Other Instructions PLEASE SEND MYCHART MESSAGE WITH Blood Pressure/Heart Rate LOG IN 2 WEEKS

## 2021-04-12 NOTE — Progress Notes (Signed)
Cardiology Office Note:    Date:  04/12/2021   ID:  Tanya Carr, DOB [birth year], MRN OF:[id number]  PCP:  Flossie Buffy, NP  Cardiologist:  Elouise Munroe, MD  Electrophysiologist:  None   Referring MD: Flossie Buffy, NP   Chief Complaint/Reason for Referral: Exercise HTN, baseline HTN  History of Present Illness:    Tanya Carr is a 50 y.o. female with a history of likely neurocardiogenic syncope, hypertension, headaches who presents today for bradycardia and chest pressure.   I first saw the patient in 2019.  We performed an echocardiogram and a cardiac monitor.  Echocardiogram was normal.  Cardiac monitor showed infrequent ventricular and supraventricular ectopy, with associated symptoms.  Due to underlying bradycardia, we deferred on initiating metoprolol.  She has been doing well overall and has returned to [facility] for exercise.  She is exercising without significant chest pain, shortness of breath or fatigue.  She does however notice she will randomly have chest pressure, and can occur with minimal exertion such as walking in the grocery store.  She also notices fluttering in her chest which is likely her known ectopic beats. When her heart rate is quite low she does have fatigue.  She is able to exercise to a high level at her gym class, and we have previously not suspected chronotropic incompetence.  We discussed exercise treadmill testing for objective data on her exercise capabilities and provocation of chest pain.  ETT showed excellent exercise capacity, however there was elevated BP with exercise and frequent exercise PVCs. Overall low risk for ischemia.   She tried recommended HCTZ and then chlorthalidone for exercise HTN. She did not feel well on chlorthalidone though it did significantly improve ambulatory BP readings. She would like to try something else. We reviewed HTN guidelines and variously pharmacologic options for treatment.   Past Medical  History:  Diagnosis Date   Allergy    seasonal   Fainting episodes 2011   history of with blood draw, mammogram, post op 2011   Headache(784.0)    history of migraines   Hypertension    on lisinopril-will do ekg today   Palpitations    PONV (postoperative nausea and vomiting)    vagal response per pt    Past Surgical History:  Procedure Laterality Date   ABDOMINAL HYSTERECTOMY     LUMBAR DISC SURGERY     ulnar nerve decompression  2011    Current Medications: Current Meds  Medication Sig   amLODipine (NORVASC) 5 MG tablet TAKE 1 TABLET BY MOUTH DAILY.   carvedilol (COREG) 3.125 MG tablet Take 1 tablet (3.125 mg total) by mouth 2 (two) times daily with a meal.   chlorthalidone (HYGROTON) 25 MG tablet Take 1 tablet (25 mg total) by mouth daily.   cyclobenzaprine (FLEXERIL) 10 MG tablet TAKE 1 TABLET BY MOUTH AT BEDTIME AS NEEDED FOR MUSCLE SPASMS.   ibuprofen (ADVIL) 400 MG tablet Take 1.5 tablets (600 mg total) by mouth every 8 (eight) hours as needed.   lisinopril (ZESTRIL) 30 MG tablet TAKE 1 TABLET BY MOUTH DAILY.     Allergies:   Bee pollen, Food, Shellfish allergy, Vit d-vit e-safflower oil, and Vitamin d analogs   Social History   Tobacco Use   Smoking status: Former    Types: Cigarettes    Quit date: 11/16/1996    Years since quitting: 24.4   Smokeless tobacco: Never  Vaping Use   Vaping Use: Never used  Substance Use Topics  Alcohol use: Yes    Comment: rare   Drug use: No     Family History: The patient's family history includes COPD in her father; Cancer in her mother; Multiple sclerosis in her mother; Pulmonary embolism in her brother. There is no history of Colon cancer, Colon polyps, Esophageal cancer, Rectal cancer, or Stomach cancer.  ROS:   Please see the history of present illness.    All other systems reviewed and are negative.  EKGs/Labs/Other Studies Reviewed:    The following studies were reviewed today:  EKG: NSR sinus arrhythmia,  sinus bradycardia with PVC.   Recent Labs: 11/09/2020: ALT 14; Hemoglobin 14.0; Platelets 210.0; TSH 2.82 03/29/2021: BUN 22; Creatinine, Ser 0.86; Potassium 4.1; Sodium 136  Recent Lipid Panel    Component Value Date/Time   CHOL 167 11/09/2020 0851   TRIG 61.0 11/09/2020 0851   HDL 61.60 11/09/2020 0851   CHOLHDL 3 11/09/2020 0851   VLDL 12.2 11/09/2020 0851   LDLCALC 93 11/09/2020 0851    Physical Exam:    VS:  BP 120/86 (BP Location: Left Arm, Patient Position: Sitting, Cuff Size: Normal)   Pulse 64   Ht '5\' 4"'$  (1.626 m)   Wt 178 lb (80.7 kg)   LMP 11/10/2011   BMI 30.55 kg/m     Wt Readings from Last 5 Encounters:  04/12/21 178 lb (80.7 kg)  02/08/21 174 lb 3.2 oz (79 kg)  11/29/20 170 lb 12.8 oz (77.5 kg)  11/09/20 168 lb 3.2 oz (76.3 kg)  12/19/19 186 lb 6.4 oz (84.6 kg)    Constitutional: No acute distress Eyes: sclera non-icteric, normal conjunctiva and lids ENMT: normal dentition, moist mucous membranes Cardiovascular: regular rhythm, normal rate, no murmurs. S1 and S2 normal. Radial pulses normal bilaterally. No jugular venous distention.  Respiratory: clear to auscultation bilaterally GI : normal bowel sounds, soft and nontender. No distention.   MSK: extremities warm, well perfused. No edema.  NEURO: grossly nonfocal exam, moves all extremities. PSYCH: alert and oriented x 3, normal mood and affect.   ASSESSMENT:    1. Primary hypertension   2. Hypertensive response to exercise   3. PVC's (premature ventricular contractions)   4. Sinus bradycardia   5. Medication management     PLAN:    Primary hypertension HTN response to exercise - already on amlodipine and lisinopril. - discussed options to avoid thiazide diuretic which excellently controlled BP but she did not feel well on.  - will trial low dose coreg for control of PVCs as well as HTN. I suspect we will struggle with baseline bradycardia but she notes elevated HR with activity, so I have asked  her to contact our office in two weeks with her readings. Red flag symptoms for symptomatic bradycardia reviewed in detail.   Sinus bradycardia - overall stable. No chronotropic incompetence on ETT. Evaluate with initiate of BB.  Chest pain of uncertain etiology - no worrisome findings on ETT.  PVC's (premature ventricular contractions) - will quantitate again after better BP control. No signs of ischemia otherwise.   Total time of encounter: 35 minutes total time of encounter, including 25 minutes spent in face-to-face patient care on the date of this encounter. This time includes coordination of care and counseling regarding above mentioned problem list. Remainder of non-face-to-face time involved reviewing chart documents/testing relevant to the patient encounter and documentation in the medical record. I have independently reviewed documentation from referring provider.   Cherlynn Kaiser, MD, [place]  Medication Adjustments/Labs and Tests Ordered: Current medicines are reviewed at length with the patient today.  Concerns regarding medicines are outlined above.   Orders Placed This Encounter  Procedures   EKG 12-Lead     Meds ordered this encounter  Medications   carvedilol (COREG) 3.125 MG tablet    Sig: Take 1 tablet (3.125 mg total) by mouth 2 (two) times daily with a meal.    Dispense:  60 tablet    Refill:  3    Patient Instructions  Medication Instructions:  START: CARVEDILOL 3.'125mg'$  TWICE DAILY  *If you need a refill on your cardiac medications before your next appointment, please call your pharmacy*  Follow-Up: At [facility], you and your health needs are our priority.  As part of our continuing mission to provide you with exceptional heart care, we have created designated Provider Care Teams.  These Care Teams include your primary Cardiologist (physician) and Advanced Practice Providers (APPs -  Physician Assistants and Nurse  Practitioners) who all work together to provide you with the care you need, when you need it.  Your next appointment:   3 month(s)  The format for your next appointment:   In Person  Provider:   Cherlynn Kaiser, MD  Other Instructions PLEASE SEND MYCHART MESSAGE WITH Blood Pressure/Heart Rate LOG IN 2 WEEKS

## 2021-04-23 NOTE — Addendum Note (Signed)
Addended by: Rexanne Mano B on: 04/23/2021 04:11 PM   Modules accepted: Orders

## 2021-04-26 DIAGNOSIS — I10 Essential (primary) hypertension: Secondary | ICD-10-CM

## 2021-05-17 MED ORDER — AMLODIPINE BESYLATE 10 MG PO TABS: 10.0000 mg | ORAL_TABLET | Freq: Every day | ORAL | 3 refills | Status: DC

## 2021-05-22 NOTE — Telephone Encounter (Signed)
Amlodipine refilled per Dr. Delphina Cahill request.

## 2021-07-09 ENCOUNTER — Other Ambulatory Visit: Payer: Self-pay

## 2021-07-09 ENCOUNTER — Ambulatory Visit (INDEPENDENT_AMBULATORY_CARE_PROVIDER_SITE_OTHER): Payer: No Typology Code available for payment source | Admitting: Internal Medicine

## 2021-07-09 ENCOUNTER — Encounter: Payer: Self-pay | Admitting: Internal Medicine

## 2021-07-09 VITALS — BP 126/82 | HR 86 | Resp 20 | Ht 64.0 in | Wt 178.6 lb

## 2021-07-09 DIAGNOSIS — I493 Ventricular premature depolarization: Secondary | ICD-10-CM

## 2021-07-09 DIAGNOSIS — I10 Essential (primary) hypertension: Secondary | ICD-10-CM | POA: Diagnosis not present

## 2021-07-09 DIAGNOSIS — R001 Bradycardia, unspecified: Secondary | ICD-10-CM | POA: Diagnosis not present

## 2021-07-09 DIAGNOSIS — R079 Chest pain, unspecified: Secondary | ICD-10-CM

## 2021-07-09 NOTE — Progress Notes (Signed)
Cardiology Office Note:    Date:  07/09/2021   ID:  Tanya Carr, DOB [birth year], MRN [id number]  PCP:  Flossie Buffy, NP  Cardiologist:  Elouise Munroe, MD  Electrophysiologist:  None   Referring MD: Flossie Buffy, NP   Chief Complaint/Reason for Referral: Exercise HTN, baseline HTN  History of Present Illness:    Tanya Carr is a 50 y.o. female with a history of likely neurocardiogenic syncope, hypertension, headaches who presents today for bradycardia and chest pressure.  She's doing much better on amlodipine 10 mg daily, and experienced bradycardia and fatigue with carvedilol which we stopped in favor of increasing amlodipine. Now BP seems well controlled on amlodipine 10 mg and lisinopril 30 mg daily. Feeling well though has had COVID twice so she feels she is going slower at [facility]. We discussed slowly working her way back up to her prior level of endurance which may take weeks to months.   The patient denies chest pain, chest pressure, dyspnea at rest or with exertion, palpitations, PND, orthopnea, or leg swelling. Denies cough, fever, chills. Denies nausea, vomiting. Denies syncope or presyncope. Denies dizziness or lightheadedness.    Prior history:  I first saw the patient in 2019.  We performed an echocardiogram and a cardiac monitor.  Echocardiogram was normal.  Cardiac monitor showed infrequent ventricular and supraventricular ectopy, with associated symptoms.  Due to underlying bradycardia, we deferred on initiating metoprolol.  She has been doing well overall and has returned to [facility] for exercise.  She is exercising without significant chest pain, shortness of breath or fatigue.  She does however notice she will randomly have chest pressure, and can occur with minimal exertion such as walking in the grocery store.  She also notices fluttering in her chest which is likely her known ectopic beats. When her heart rate is quite low she  does have fatigue.  She is able to exercise to a high level at her gym class, and we have previously not suspected chronotropic incompetence.  We discussed exercise treadmill testing for objective data on her exercise capabilities and provocation of chest pain.  ETT showed excellent exercise capacity, however there was elevated BP with exercise and frequent exercise PVCs. Overall low risk for ischemia.   She tried recommended HCTZ and then chlorthalidone for HTN. She did not feel well on chlorthalidone though it did significantly improve ambulatory BP readings. She would like to try something else. We reviewed HTN guidelines and variously pharmacologic options for treatment.   Past Medical History:  Diagnosis Date   Allergy    seasonal   Fainting episodes 2011   history of with blood draw, mammogram, post op 2011   Headache(784.0)    history of migraines   Hypertension    on lisinopril-will do ekg today   Palpitations    PONV (postoperative nausea and vomiting)    vagal response per pt    Past Surgical History:  Procedure Laterality Date   ABDOMINAL HYSTERECTOMY     LUMBAR DISC SURGERY     ulnar nerve decompression  2011    Current Medications: Current Meds  Medication Sig   amLODipine (NORVASC) 10 MG tablet Take 1 tablet (10 mg total) by mouth daily.   cyclobenzaprine (FLEXERIL) 10 MG tablet TAKE 1 TABLET BY MOUTH AT BEDTIME AS NEEDED FOR MUSCLE SPASMS.   ibuprofen (ADVIL) 400 MG tablet Take 1.5 tablets (600 mg total) by mouth every 8 (eight) hours as needed.  lisinopril (ZESTRIL) 30 MG tablet TAKE 1 TABLET BY MOUTH DAILY.     Allergies:   Bee pollen, Food, Shellfish allergy, Vit d-vit e-safflower oil, and Vitamin d analogs   Social History   Tobacco Use   Smoking status: Former    Types: Cigarettes    Quit date: 11/16/1996    Years since quitting: 24.6   Smokeless tobacco: Never  Vaping Use   Vaping Use: Never used  Substance Use Topics   Alcohol use: Yes     Comment: rare   Drug use: No     Family History: The patient's family history includes COPD in her father; Cancer in her mother; Multiple sclerosis in her mother; Pulmonary embolism in her brother. There is no history of Colon cancer, Colon polyps, Esophageal cancer, Rectal cancer, or Stomach cancer.  ROS:   Please see the history of present illness.    All other systems reviewed and are negative.  EKGs/Labs/Other Studies Reviewed:    The following studies were reviewed today:  EKG: NSR sinus arrhythmia, sinus bradycardia with PVC.  Recent Labs: 11/09/2020: ALT 14; Hemoglobin 14.0; Platelets 210.0; TSH 2.82 03/29/2021: BUN 22; Creatinine, Ser 0.86; Potassium 4.1; Sodium 136  Recent Lipid Panel    Component Value Date/Time   CHOL 167 11/09/2020 0851   TRIG 61.0 11/09/2020 0851   HDL 61.60 11/09/2020 0851   CHOLHDL 3 11/09/2020 0851   VLDL 12.2 11/09/2020 0851   LDLCALC 93 11/09/2020 0851    Physical Exam:    VS:  BP 126/82 (BP Location: Left Arm, Patient Position: Sitting, Cuff Size: Normal)   Pulse 86   Resp 20   Ht 5\' 4"  (1.626 m)   Wt 178 lb 9.6 oz (81 kg)   LMP 11/10/2011   SpO2 92%   BMI 30.66 kg/m     Wt Readings from Last 5 Encounters:  07/09/21 178 lb 9.6 oz (81 kg)  04/12/21 178 lb (80.7 kg)  02/08/21 174 lb 3.2 oz (79 kg)  11/29/20 170 lb 12.8 oz (77.5 kg)  11/09/20 168 lb 3.2 oz (76.3 kg)    Constitutional: No acute distress Eyes: sclera non-icteric, normal conjunctiva and lids ENMT: normal dentition, moist mucous membranes Cardiovascular: regular rhythm, normal rate, no murmurs. S1 and S2 normal. Radial pulses normal bilaterally. No jugular venous distention.  Respiratory: clear to auscultation bilaterally GI : normal bowel sounds, soft and nontender. No distention.   MSK: extremities warm, well perfused. No edema.  NEURO: grossly nonfocal exam, moves all extremities. PSYCH: alert and oriented x 3, normal mood and affect.   ASSESSMENT:    1.  Primary hypertension   2. Hypertensive response to exercise   3. PVC's (premature ventricular contractions)   4. Sinus bradycardia   5. Medication management   6. Chest pain of uncertain etiology    PLAN:    Primary hypertension HTN response to exercise - already on amlodipine and lisinopril. - discussed options to avoid thiazide diuretic which excellently controlled BP but she did not feel well on.  - did not do well on coreg due to fatigue and bradycardia. - continue amlodipine at increased dose of 10 mg daily and home dose of lisinopril 30 mg daily.   Sinus bradycardia - overall stable. No chronotropic incompetence on ETT. Stable and not present today.   Chest pain of uncertain etiology - no worrisome findings on ETT.  PVC's (premature ventricular contractions) - no significant symptoms of PVCs lately, continue to monitor.   Total time  of encounter: 30 minutes total time of encounter, including 20 minutes spent in face-to-face patient care on the date of this encounter. This time includes coordination of care and counseling regarding above mentioned problem list. Remainder of non-face-to-face time involved reviewing chart documents/testing relevant to the patient encounter and documentation in the medical record. I have independently reviewed documentation from referring provider.   Cherlynn Kaiser, MD, [facility]     Medication Adjustments/Labs and Tests Ordered: Current medicines are reviewed at length with the patient today.  Concerns regarding medicines are outlined above.   No orders of the defined types were placed in this encounter.    No orders of the defined types were placed in this encounter.   Patient Instructions  Medication Instructions:  Your physician recommends that you continue on your current medications as directed. Please refer to the Current Medication list given to you today.  *If you need a refill on your cardiac  medications before your next appointment, please call your pharmacy*  Follow-Up: At [facility], you and your health needs are our priority.  As part of our continuing mission to provide you with exceptional heart care, we have created designated Provider Care Teams.  These Care Teams include your primary Cardiologist (physician) and Advanced Practice Providers (APPs -  Physician Assistants and Nurse Practitioners) who all work together to provide you with the care you need, when you need it.  We recommend signing up for the patient portal called "MyChart".  Sign up information is provided on this After Visit Summary.  MyChart is used to connect with patients for Virtual Visits (Telemedicine).  Patients are able to view lab/test results, encounter notes, upcoming appointments, etc.  Non-urgent messages can be sent to your provider as well.   To learn more about what you can do with MyChart, go to [email or web address].    Your next appointment:   6 month(s)  The format for your next appointment:   In Person  Provider:   Elouise Munroe, MD {

## 2021-07-09 NOTE — Patient Instructions (Signed)
Medication Instructions:  Your physician recommends that you continue on your current medications as directed. Please refer to the Current Medication list given to you today.  *If you need a refill on your cardiac medications before your next appointment, please call your pharmacy*  Follow-Up: At [facility], you and your health needs are our priority.  As part of our continuing mission to provide you with exceptional heart care, we have created designated Provider Care Teams.  These Care Teams include your primary Cardiologist (physician) and Advanced Practice Providers (APPs -  Physician Assistants and Nurse Practitioners) who all work together to provide you with the care you need, when you need it.  We recommend signing up for the patient portal called "MyChart".  Sign up information is provided on this After Visit Summary.  MyChart is used to connect with patients for Virtual Visits (Telemedicine).  Patients are able to view lab/test results, encounter notes, upcoming appointments, etc.  Non-urgent messages can be sent to your provider as well.   To learn more about what you can do with MyChart, go to [email or web address].    Your next appointment:   6 month(s)  The format for your next appointment:   In Person  Provider:   Elouise Munroe, MD {

## 2021-07-11 ENCOUNTER — Other Ambulatory Visit: Payer: Self-pay | Admitting: Nurse Practitioner

## 2021-07-11 DIAGNOSIS — M5137 Other intervertebral disc degeneration, lumbosacral region: Secondary | ICD-10-CM

## 2021-07-17 NOTE — Telephone Encounter (Signed)
Chart supports Rx Medication sent and pt informed to make follow up appointment for additional refills.

## 2021-07-17 NOTE — Telephone Encounter (Signed)
Pt called requesting refill on cyclobenzaprine

## 2021-08-06 ENCOUNTER — Other Ambulatory Visit: Payer: Self-pay | Admitting: Nurse Practitioner

## 2021-08-06 DIAGNOSIS — I10 Essential (primary) hypertension: Secondary | ICD-10-CM

## 2021-08-09 ENCOUNTER — Other Ambulatory Visit: Payer: Self-pay

## 2021-08-09 DIAGNOSIS — I10 Essential (primary) hypertension: Secondary | ICD-10-CM

## 2021-08-09 MED ORDER — LISINOPRIL 30 MG PO TABS: 30.0000 mg | ORAL_TABLET | Freq: Every day | ORAL | 3 refills | Status: DC

## 2021-08-09 NOTE — Addendum Note (Signed)
Addended by: Rexanne Mano B on: 08/09/2021 07:14 AM   Modules accepted: Orders

## 2021-10-07 ENCOUNTER — Other Ambulatory Visit: Payer: Self-pay | Admitting: Nurse Practitioner

## 2021-10-07 DIAGNOSIS — Z1231 Encounter for screening mammogram for malignant neoplasm of breast: Secondary | ICD-10-CM

## 2021-11-12 ENCOUNTER — Encounter: Payer: Self-pay | Admitting: Nurse Practitioner

## 2021-11-12 ENCOUNTER — Ambulatory Visit (INDEPENDENT_AMBULATORY_CARE_PROVIDER_SITE_OTHER): Payer: No Typology Code available for payment source | Admitting: Nurse Practitioner

## 2021-11-12 ENCOUNTER — Other Ambulatory Visit: Payer: Self-pay

## 2021-11-12 VITALS — BP 130/78 | HR 60 | Temp 97.9°F | Ht 63.78 in | Wt 191.0 lb

## 2021-11-12 DIAGNOSIS — Z6835 Body mass index (BMI) 35.0-35.9, adult: Secondary | ICD-10-CM

## 2021-11-12 DIAGNOSIS — Z13220 Encounter for screening for lipoid disorders: Secondary | ICD-10-CM | POA: Diagnosis not present

## 2021-11-12 DIAGNOSIS — E559 Vitamin D deficiency, unspecified: Secondary | ICD-10-CM | POA: Diagnosis not present

## 2021-11-12 DIAGNOSIS — E66812 Obesity, class 2: Secondary | ICD-10-CM

## 2021-11-12 DIAGNOSIS — Z136 Encounter for screening for cardiovascular disorders: Secondary | ICD-10-CM

## 2021-11-12 DIAGNOSIS — Z0000 Encounter for general adult medical examination without abnormal findings: Secondary | ICD-10-CM

## 2021-11-12 LAB — CBC
HCT: 40.7 % (ref 36.0–46.0)
Hemoglobin: 13.6 g/dL (ref 12.0–15.0)
MCHC: 33.3 g/dL (ref 30.0–36.0)
MCV: 93.3 fl (ref 78.0–100.0)
Platelets: 182 10*3/uL (ref 150.0–400.0)
RBC: 4.36 Mil/uL (ref 3.87–5.11)
RDW: 13.2 % (ref 11.5–15.5)
WBC: 4.8 10*3/uL (ref 4.0–10.5)

## 2021-11-12 LAB — COMPREHENSIVE METABOLIC PANEL
ALT: 10 U/L (ref 0–35)
AST: 18 U/L (ref 0–37)
Albumin: 4.5 g/dL (ref 3.5–5.2)
Alkaline Phosphatase: 55 U/L (ref 39–117)
BUN: 16 mg/dL (ref 6–23)
CO2: 30 mEq/L (ref 19–32)
Calcium: 9.2 mg/dL (ref 8.4–10.5)
Chloride: 99 mEq/L (ref 96–112)
Creatinine, Ser: 0.79 mg/dL (ref 0.40–1.20)
GFR: 86 mL/min (ref >60.00)
Glucose, Bld: 91 mg/dL (ref 70–99)
Potassium: 4 mEq/L (ref 3.5–5.1)
Sodium: 136 mEq/L (ref 135–145)
Total Bilirubin: 0.5 mg/dL (ref 0.2–1.2)
Total Protein: 6.8 g/dL (ref 6.0–8.3)

## 2021-11-12 LAB — TSH: TSH: 2.34 u[IU]/mL (ref 0.35–5.50)

## 2021-11-12 LAB — LIPID PANEL
Cholesterol: 180 mg/dL (ref 0–200)
HDL: 63 mg/dL (ref >39.00)
LDL Cholesterol: 104 mg/dL — ABNORMAL HIGH (ref 0–99)
NonHDL: 117.32
Total CHOL/HDL Ratio: 3
Triglycerides: 69 mg/dL (ref 0.0–149.0)
VLDL: 13.8 mg/dL (ref 0.0–40.0)

## 2021-11-12 LAB — HEMOGLOBIN A1C: Hgb A1c MFr Bld: 5.2 % (ref 4.6–6.5)

## 2021-11-12 LAB — VITAMIN D 25 HYDROXY (VIT D DEFICIENCY, FRACTURES): VITD: 65.77 ng/mL (ref 30.00–100.00)

## 2021-11-12 NOTE — Progress Notes (Signed)
Subjective:    Patient ID: Tanya Carr, female    DOB: [birth year], 51 y.o.   MRN: [id number]  Patient presents today for CPE   HPI  Vision:up to date Dental:up to date Exercise:cardio and strength training Weight:  Wt Readings from Last 3 Encounters:  11/12/21 191 lb (86.6 kg)  07/09/21 178 lb 9.6 oz (81 kg)  04/12/21 178 lb (80.7 kg)    Sexual History: STD SCREEN: declined Mammogram: scheduled Cervical cancer:s/p hysterectomy Colon Cancer Screen: up to date  Depression/Suicide: Depression screen [facility] 2/9 11/12/2021 11/09/2020 11/08/2019 10/25/2018 10/20/2017 08/15/2015 10/14/2013  Decreased Interest 0 0 0 0 0 0 0  Down, Depressed, Hopeless 0 0 0 0 0 0 0  PHQ - 2 Score 0 0 0 0 0 0 0  Altered sleeping 3 - - - - - -  Tired, decreased energy 1 - - - - - -  Change in appetite 1 - - - - - -  Feeling bad or failure about yourself  0 - - - - - -  Trouble concentrating 0 - - - - - -  Moving slowly or fidgety/restless 0 - - - - - -  Suicidal thoughts 0 - - - - - -  PHQ-9 Score 5 - - - - - -  Difficult doing work/chores Not difficult at all - - - - - -   Immunizations: (TDAP, Hep C screen, Pneumovax, Influenza, zoster)  Health Maintenance  Topic Date Due   COVID-19 Vaccine (3 - Pfizer risk series) 01/08/2020   Flu Shot  11/29/2021*   Zoster (Shingles) Vaccine (1 of 2) 02/12/2022*   Hepatitis C Screening: USPSTF Recommendation to screen - Ages 18-79 yo.  11/13/2022*   Tetanus Vaccine  09/04/2022   Mammogram  11/16/2022   Colon Cancer Screening  12/19/2026   HIV Screening  Completed   HPV Vaccine  Aged Out  *Topic was postponed. The date shown is not the original due date.   Fall Risk: Fall Risk  10/25/2018 10/20/2017 10/14/2013  Falls in the past year? 0 No No  Follow up Falls evaluation completed - -   Advanced Directive: Advanced Directives 11/25/2011  Does Patient Have a Medical Advance Directive? Patient does not have advance directive  Pre-existing out of facility  DNR order (yellow form or pink MOST form) No    Medications and allergies reviewed with patient and updated if appropriate.  Patient Active Problem List   Diagnosis Date Noted   Sinus bradycardia 11/12/2020   HTN (hypertension) 10/20/2017   Anxiety 03/06/2017   DDD (degenerative disc disease), lumbosacral 09/24/2016   Vitamin D deficiency 07/11/2009    Current Outpatient Medications on File Prior to Visit  Medication Sig Dispense Refill   amLODipine (NORVASC) 10 MG tablet Take 1 tablet (10 mg total) by mouth daily. 90 tablet 3   cyclobenzaprine (FLEXERIL) 10 MG tablet TAKE 1 TABLET BY MOUTH AT BEDTIME AS NEEDED FOR MUSCLE SPASMS. 30 tablet 0   ibuprofen (ADVIL) 400 MG tablet Take 1.5 tablets (600 mg total) by mouth every 8 (eight) hours as needed.     lisinopril (ZESTRIL) 30 MG tablet Take 1 tablet (30 mg total) by mouth daily. 90 tablet 3   No current facility-administered medications on file prior to visit.    Past Medical History:  Diagnosis Date   Allergy    seasonal   Fainting episodes 2011   history of with blood draw, mammogram, post op 2011   Headache(784.0)  history of migraines   Hypertension    on lisinopril-will do ekg today   Palpitations    PONV (postoperative nausea and vomiting)    vagal response per pt    Past Surgical History:  Procedure Laterality Date   ABDOMINAL HYSTERECTOMY     LUMBAR DISC SURGERY     ulnar nerve decompression  2011   Social History   Socioeconomic History   Marital status: Single    Spouse name: Not on file   Number of children: Not on file   Years of education: Not on file   Highest education level: Not on file  Occupational History   Not on file  Tobacco Use   Smoking status: Former    Types: Cigarettes    Quit date: 11/16/1996    Years since quitting: 25.0   Smokeless tobacco: Never  Vaping Use   Vaping Use: Never used  Substance and Sexual Activity   Alcohol use: Yes    Comment: rare   Drug use: No   Sexual  activity: Not on file  Other Topics Concern   Not on file  Social History Narrative   G0   Travel agent   Social Determinants of Health   Financial Resource Strain: Not on file  Food Insecurity: Not on file  Transportation Needs: Not on file  Physical Activity: Not on file  Stress: Not on file  Social Connections: Not on file   Family History  Problem Relation Age of Onset   Cancer Mother    Multiple sclerosis Mother    COPD Father    Pulmonary embolism Brother    Colon cancer Neg Hx    Colon polyps Neg Hx    Esophageal cancer Neg Hx    Rectal cancer Neg Hx    Stomach cancer Neg Hx        Review of Systems  Constitutional:  Negative for fever, malaise/fatigue and weight loss.  HENT:  Negative for congestion and sore throat.   Eyes:        Negative for visual changes  Respiratory:  Negative for cough and shortness of breath.   Cardiovascular:  Negative for chest pain, palpitations and leg swelling.  Gastrointestinal:  Negative for blood in stool, constipation, diarrhea and heartburn.  Genitourinary:  Negative for dysuria, frequency and urgency.  Musculoskeletal:  Negative for falls, joint pain and myalgias.  Skin:  Negative for rash.  Neurological:  Negative for dizziness, sensory change and headaches.  Endo/Heme/Allergies:  Does not bruise/bleed easily.  Psychiatric/Behavioral:  Negative for depression, substance abuse and suicidal ideas. The patient is not nervous/anxious.    Objective:   Vitals:   11/12/21 1043  BP: 130/78  Pulse: 60  Temp: 97.9 F (36.6 C)  SpO2: 98%    Body mass index is 33.01 kg/m.  Physical Examination:  Physical Exam Vitals reviewed.  Constitutional:      General: She is not in acute distress.    Appearance: She is well-developed. She is obese.  HENT:     Right Ear: Tympanic membrane, ear canal and external ear normal.     Left Ear: Tympanic membrane, ear canal and external ear normal.  Eyes:     Extraocular Movements:  Extraocular movements intact.     Conjunctiva/sclera: Conjunctivae normal.  Cardiovascular:     Rate and Rhythm: Normal rate and regular rhythm.     Pulses: Normal pulses.     Heart sounds: Normal heart sounds.  Pulmonary:     Effort:  Pulmonary effort is normal. No respiratory distress.     Breath sounds: Normal breath sounds.  Chest:     Chest wall: No tenderness.  Abdominal:     General: Bowel sounds are normal.     Palpations: Abdomen is soft.  Musculoskeletal:        General: Normal range of motion.     Cervical back: Normal range of motion and neck supple.     Right lower leg: No edema.     Left lower leg: No edema.  Skin:    General: Skin is warm and dry.  Neurological:     Mental Status: She is alert and oriented to person, place, and time.     Deep Tendon Reflexes: Reflexes are normal and symmetric.  Psychiatric:        Mood and Affect: Mood normal.        Behavior: Behavior normal.        Thought Content: Thought content normal.   ASSESSMENT and PLAN: This visit occurred during the SARS-CoV-2 public health emergency.  Safety protocols were in place, including screening questions prior to the visit, additional usage of staff PPE, and extensive cleaning of exam room while observing appropriate contact time as indicated for disinfecting solutions.   Hero was seen today for annual exam.  Diagnoses and all orders for this visit:  Preventative health care -     Comprehensive metabolic panel -     CBC  Encounter for lipid screening for cardiovascular disease -     Lipid panel  Vitamin D deficiency -     Vitamin D (25 hydroxy)  Class 2 severe obesity with serious comorbidity and body mass index (BMI) of 35.0 to 35.9 in adult, unspecified obesity type (HCC) -     Lipid panel -     TSH -     Hemoglobin A1c      Problem List Items Addressed This Visit       Other   Vitamin D deficiency   Relevant Orders   Vitamin D (25 hydroxy)   Other Visit Diagnoses      Preventative health care    -  Primary   Relevant Orders   Comprehensive metabolic panel   CBC   Encounter for lipid screening for cardiovascular disease       Relevant Orders   Lipid panel   Class 2 severe obesity with serious comorbidity and body mass index (BMI) of 35.0 to 35.9 in adult, unspecified obesity type (HCC)       Relevant Orders   Lipid panel   TSH   Hemoglobin A1c       Follow up: Return in about 1 year (around 11/13/2022) for CPE (fasting).  Alysia Penna, NP

## 2021-11-12 NOTE — Patient Instructions (Addendum)
Go to lab for blood draw. ?Let me know if you change your mind about taking trazodone or restoril for insomnia. ? ?Preventive Care 8-53 Years Old, Female ?Preventive care refers to lifestyle choices and visits with your health care provider that can promote health and wellness. Preventive care visits are also called wellness exams. ?What can I expect for my preventive care visit? ?Counseling ?Your health care provider may ask you questions about your: ?Medical history, including: ?Past medical problems. ?Family medical history. ?Pregnancy history. ?Current health, including: ?Menstrual cycle. ?Method of birth control. ?Emotional well-being. ?Home life and relationship well-being. ?Sexual activity and sexual health. ?Lifestyle, including: ?Alcohol, nicotine or tobacco, and drug use. ?Access to firearms. ?Diet, exercise, and sleep habits. ?Work and work Statistician. ?Sunscreen use. ?Safety issues such as seatbelt and bike helmet use. ?Physical exam ?Your health care provider will check your: ?Height and weight. These may be used to calculate your BMI (body mass index). BMI is a measurement that tells if you are at a healthy weight. ?Waist circumference. This measures the distance around your waistline. This measurement also tells if you are at a healthy weight and may help predict your risk of certain diseases, such as type 2 diabetes and high blood pressure. ?Heart rate and blood pressure. ?Body temperature. ?Skin for abnormal spots. ?What immunizations do I need? ?Vaccines are usually given at various ages, according to a schedule. Your health care provider will recommend vaccines for you based on your age, medical history, and lifestyle or other factors, such as travel or where you work. ?What tests do I need? ?Screening ?Your health care provider may recommend screening tests for certain conditions. This may include: ?Lipid and cholesterol levels. ?Diabetes screening. This is done by checking your blood sugar  (glucose) after you have not eaten for a while (fasting). ?Pelvic exam and Pap test. ?Hepatitis B test. ?Hepatitis C test. ?HIV (human immunodeficiency virus) test. ?STI (sexually transmitted infection) testing, if you are at risk. ?Lung cancer screening. ?Colorectal cancer screening. ?Mammogram. Talk with your health care provider about when you should start having regular mammograms. This may depend on whether you have a family history of breast cancer. ?BRCA-related cancer screening. This may be done if you have a family history of breast, ovarian, tubal, or peritoneal cancers. ?Bone density scan. This is done to screen for osteoporosis. ?Talk with your health care provider about your test results, treatment options, and if necessary, the need for more tests. ?Follow these instructions at home: ?Eating and drinking ? ?Eat a diet that includes fresh fruits and vegetables, whole grains, lean protein, and low-fat dairy products. ?Take vitamin and mineral supplements as recommended by your health care provider. ?Do not drink alcohol if: ?Your health care provider tells you not to drink. ?You are pregnant, may be pregnant, or are planning to become pregnant. ?If you drink alcohol: ?Limit how much you have to 0-1 drink a day. ?Know how much alcohol is in your drink. In the [place] one drink equals one 12 oz bottle of beer (355 mL), one 5 oz glass of wine (148 mL), or one 1? oz glass of hard liquor (44 mL). ?Lifestyle ?Brush your teeth every morning and night with fluoride toothpaste. Floss one time each day. ?Exercise for at least 30 minutes 5 or more days each week. ?Do not use any products that contain nicotine or tobacco. These products include cigarettes, chewing tobacco, and vaping devices, such as e-cigarettes. If you need help quitting, ask your health care provider. ?  Do not use drugs. ?If you are sexually active, practice safe sex. Use a condom or other form of protection to prevent STIs. ?If you do not wish to  become pregnant, use a form of birth control. If you plan to become pregnant, see your health care provider for a prepregnancy visit. ?Take aspirin only as told by your health care provider. Make sure that you understand how much to take and what form to take. Work with your health care provider to find out whether it is safe and beneficial for you to take aspirin daily. ?Find healthy ways to manage stress, such as: ?Meditation, yoga, or listening to music. ?Journaling. ?Talking to a trusted person. ?Spending time with friends and family. ?Minimize exposure to UV radiation to reduce your risk of skin cancer. ?Safety ?Always wear your seat belt while driving or riding in a vehicle. ?Do not drive: ?If you have been drinking alcohol. Do not ride with someone who has been drinking. ?When you are tired or distracted. ?While texting. ?If you have been using any mind-altering substances or drugs. ?Wear a helmet and other protective equipment during sports activities. ?If you have firearms in your house, make sure you follow all gun safety procedures. ?Seek help if you have been physically or sexually abused. ?What's next? ?Visit your health care provider once a year for an annual wellness visit. ?Ask your health care provider how often you should have your eyes and teeth checked. ?Stay up to date on all vaccines. ?This information is not intended to replace advice given to you by your health care provider. Make sure you discuss any questions you have with your health care provider. ?Document Revised: 02/13/2021 Document Reviewed: 02/13/2021 ?Elsevier Patient Education ? [place]. ? ?

## 2021-11-18 ENCOUNTER — Ambulatory Visit
Admission: RE | Admit: 2021-11-18 | Discharge: 2021-11-18 | Disposition: A | Discharge status: Home / Self Care | Payer: PRIVATE HEALTH INSURANCE | Source: Ambulatory Visit | Attending: Nurse Practitioner | Admitting: Nurse Practitioner

## 2021-11-18 DIAGNOSIS — Z1231 Encounter for screening mammogram for malignant neoplasm of breast: Secondary | ICD-10-CM

## 2022-01-21 ENCOUNTER — Encounter: Payer: Self-pay | Admitting: Nurse Practitioner

## 2022-02-06 ENCOUNTER — Other Ambulatory Visit: Payer: Self-pay | Admitting: Nurse Practitioner

## 2022-02-06 DIAGNOSIS — M5137 Other intervertebral disc degeneration, lumbosacral region: Secondary | ICD-10-CM

## 2022-02-06 NOTE — Telephone Encounter (Signed)
Refill request for  Cyclobenzaprine 10 mg LR 07/17/21, #30, 0 rf LOV 11/12/21 FOV   none scheduled.   Please review and advise.  Thanks. Dm/cma

## 2022-02-24 ENCOUNTER — Ambulatory Visit: Payer: PRIVATE HEALTH INSURANCE | Admitting: Nurse Practitioner

## 2022-02-24 ENCOUNTER — Telehealth: Payer: Self-pay | Admitting: Nurse Practitioner

## 2022-02-25 ENCOUNTER — Ambulatory Visit (INDEPENDENT_AMBULATORY_CARE_PROVIDER_SITE_OTHER): Payer: PRIVATE HEALTH INSURANCE | Admitting: Family Medicine

## 2022-02-25 ENCOUNTER — Encounter: Payer: Self-pay | Admitting: Family Medicine

## 2022-02-25 VITALS — BP 118/80 | HR 74 | Temp 97.8°F | Ht 63.75 in | Wt 199.0 lb

## 2022-02-25 DIAGNOSIS — R0683 Snoring: Secondary | ICD-10-CM | POA: Diagnosis not present

## 2022-02-25 DIAGNOSIS — E6609 Other obesity due to excess calories: Secondary | ICD-10-CM

## 2022-02-25 DIAGNOSIS — I10 Essential (primary) hypertension: Secondary | ICD-10-CM

## 2022-02-25 DIAGNOSIS — Z6834 Body mass index (BMI) 34.0-34.9, adult: Secondary | ICD-10-CM | POA: Diagnosis not present

## 2022-02-27 DIAGNOSIS — M21622 Bunionette of left foot: Secondary | ICD-10-CM | POA: Insufficient documentation

## 2022-04-03 NOTE — Telephone Encounter (Signed)
1st no show, fee waived, letter sent 

## 2022-04-28 ENCOUNTER — Ambulatory Visit: Payer: No Typology Code available for payment source | Admitting: Neurology

## 2022-04-28 ENCOUNTER — Encounter: Payer: Self-pay | Admitting: Neurology

## 2022-04-28 VITALS — BP 132/80 | HR 75 | Ht 64.0 in | Wt 201.4 lb

## 2022-04-28 DIAGNOSIS — R0683 Snoring: Secondary | ICD-10-CM

## 2022-04-28 DIAGNOSIS — E669 Obesity, unspecified: Secondary | ICD-10-CM

## 2022-04-28 DIAGNOSIS — Z820 Family history of epilepsy and other diseases of the nervous system: Secondary | ICD-10-CM

## 2022-04-28 DIAGNOSIS — E66811 Obesity, class 1: Secondary | ICD-10-CM

## 2022-04-28 DIAGNOSIS — G4719 Other hypersomnia: Secondary | ICD-10-CM

## 2022-04-28 DIAGNOSIS — Z9189 Other specified personal risk factors, not elsewhere classified: Secondary | ICD-10-CM | POA: Diagnosis not present

## 2022-04-28 DIAGNOSIS — R351 Nocturia: Secondary | ICD-10-CM

## 2022-04-28 NOTE — Patient Instructions (Signed)

## 2022-04-28 NOTE — Progress Notes (Signed)
Subjective:    Patient ID: Tanya Carr is a 51 y.o. female.  HPI    Star Age, MD, PhD [facility] [address], [address] [place], [place] [address]  Dear Dr. Gena Fray,  I saw your patient, Tanya Carr, upon your kind request in my sleep clinic today for initial consultation of her sleep disorder, in particular, concern for underlying obstructive sleep apnea.  The patient is unaccompanied today.  As you know, Tanya Carr is a 51 year old right-handed woman with an underlying medical history of hypertension, degenerative lumbar disc disease, vitamin D deficiency, anxiety, sinus bradycardia, and obesity, who reports snoring and some daytime tiredness.  There is snoring is disturbing to her husband and loud enough for him to sleep in a different bedroom.  I reviewed your office note from 02/25/2022.  Her Epworth sleepiness score is 3 out of 24, fatigue severity score is 22 out of 63.  She lives with her significant other of 20 years, she has had loud enough snoring to disturb his sleep.  She has tried a dental device through her dentist but has not used it recently.  She found the second level too uncomfortable and not effective.  She had braces last year.  Bedtime is generally around 9 PM but she does wake up early as her partner wakes up around 2 AM and then she goes back to bed, eventually falls back asleep around 4 or 4:30 AM and then gets up at 7:30 AM.  She does have a TV in her bedroom on at night but turns it off before falling asleep.  She has nocturia about twice per average night.  She does not have any recurrent morning headaches.  She has had some weight fluctuation, lost up to 40 pounds but does still snore when she weighs less.  She has had weight gain again.  She has a family history of sleep apnea, her father had CPAP therapy and he was also on treatment for COPD as understand.  She has no pets in household, they have no kids.  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  She drinks caffeine in the form of coffee, about 2 cups in the morning, alcohol occasionally, quit smoking several years ago.  She has also tried over-the-counter devices for snoring, none of which helped.  Her Past Medical History Is Significant For: Past Medical History:  Diagnosis Date   Allergy    seasonal   Fainting episodes 2011   history of with blood draw, mammogram, post op 2011   Headache(784.0)    history of migraines   Hypertension    on lisinopril-will do ekg today   Palpitations    PVC's   PONV (postoperative nausea and vomiting)    vagal response per pt    Her Past Surgical History Is Significant For: Past Surgical History:  Procedure Laterality Date   ABDOMINAL HYSTERECTOMY     LUMBAR DISC SURGERY     ulnar nerve decompression  2011    Her Family History Is Significant For: Family History  Problem Relation Age of Onset   Cancer Mother    Multiple sclerosis Mother    COPD Father    Pulmonary embolism Brother    Colon cancer Neg Hx    Colon polyps Neg Hx    Esophageal cancer Neg Hx    Rectal cancer Neg Hx    Stomach cancer Neg Hx     Her Social History Is Significant For:  Social History   Socioeconomic History   Marital status: Single    Spouse name: Not on file   Number of children: Not on file   Years of education: Not on file   Highest education level: Not on file  Occupational History   Not on file  Tobacco Use   Smoking status: Former    Types: Cigarettes    Quit date: 11/16/1996    Years since quitting: 25.4   Smokeless tobacco: Never  Vaping Use   Vaping Use: Never used  Substance and Sexual Activity   Alcohol use: Yes    Comment: rare   Drug use: No   Sexual activity: Yes  Other Topics Concern   Not on file  Social History Narrative   G0, lives at home with significant other.    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    Caffeine: 1 cup daily.   Social Determinants of Health   Financial Resource  Strain: Not on file  Food Insecurity: Not on file  Transportation Needs: Not on file  Physical Activity: Not on file  Stress: Not on file  Social Connections: Not on file    Her Allergies Are:  Allergies  Allergen Reactions   Bee Pollen    Food Hives    Seafood allergy: hives and itching   Shellfish Allergy    Vit D-Vit E-Safflower Oil    Vitamin D Analogs   :   Her Current Medications Are:  Outpatient Encounter Medications as of 04/28/2022  Medication Sig   amLODipine (NORVASC) 10 MG tablet Take 1 tablet (10 mg total) by mouth daily.   cyclobenzaprine (FLEXERIL) 10 MG tablet TAKE 1 TABLET BY MOUTH AT BEDTIME AS NEEDED FOR MUSCLE SPASMS.   ibuprofen (ADVIL) 400 MG tablet Take 1.5 tablets (600 mg total) by mouth every 8 (eight) hours as needed.   lisinopril (ZESTRIL) 30 MG tablet Take 1 tablet (30 mg total) by mouth daily.   No facility-administered encounter medications on file as of 04/28/2022.  :   Review of Systems:  Out of a complete 14 point review of systems, all are reviewed and negative with the exception of these symptoms as listed below:   Review of Systems  Neurological:         snoring, obesity and struggles with weight loss, hypertension.  Fragmented sleep (due to work), menopausal.  ESS  3, FSS 22.    Objective:  Neurological Exam  Physical Exam Physical Examination:   Vitals:   04/28/22 1001  BP: 132/80  Pulse: 75    General Examination: The patient is a very pleasant 51 y.o. female in no acute distress. She appears well-developed and well-nourished and well groomed.   HEENT: Normocephalic, atraumatic, pupils are equal, round and reactive to light, extraocular tracking is good without limitation to gaze excursion or nystagmus noted. Hearing is grossly intact. Face is symmetric with normal facial animation. Speech is clear with no dysarthria noted. There is no hypophonia. There is no lip, neck/head, jaw or voice tremor. Neck is supple with full range  of passive and active motion. There are no carotid bruits on auscultation. Oropharynx exam reveals: mild mouth dryness, adequate dental hygiene and moderate airway crowding, due to small airway entry, Mallampati class III, tonsillar size of 1-2+ on the left and 1+ on the right, neck circumference of 13 5/8 inches.  Mild overbite.    Chest: Clear to auscultation without wheezing, rhonchi or crackles noted.  Heart: S1+S2+0, regular and normal  without murmurs, rubs or gallops noted.   Abdomen: Soft, non-tender and non-distended.  Extremities: There is no obvious edema in the distal lower extremities bilaterally.   Skin: Warm and dry without trophic changes noted.   Musculoskeletal: exam reveals no obvious joint deformities.   Neurologically:  Mental status: The patient is awake, alert and oriented in all 4 spheres. Her immediate and remote memory, attention, language skills and fund of knowledge are appropriate. There is no evidence of aphasia, agnosia, apraxia or anomia. Speech is clear with normal prosody and enunciation. Thought process is linear. Mood is normal and affect is normal.  Cranial nerves II - XII are as described above under HEENT exam.  Motor exam: Normal bulk, strength and tone is noted. There is no tremor, Romberg is negative. Reflexes are 2+ throughout. Fine motor skills and coordination: grossly intact.  Cerebellar testing: No dysmetria or intention tremor. There is no truncal or gait ataxia.  Sensory exam: intact to light touch in the upper and lower extremities.  Gait, station and balance: She stands easily. No veering to one side is noted. No leaning to one side is noted. Posture is age-appropriate and stance is narrow based. Gait shows normal stride length and normal pace. No problems turning are noted.   Assessment and Plan:  In summary, NICLE CONNOLE is a very pleasant 51 y.o.-year old female with an underlying medical history of hypertension, degenerative lumbar  disc disease, vitamin D deficiency, anxiety, sinus bradycardia, and obesity, whose history and physical exam concerning for sleep disordered breathing, supporting a current working diagnosis of unspecified sleep apnea, with the main differential diagnoses of obstructive sleep apnea (OSA) versus upper airway resistance syndrome (UARS) versus central sleep apnea (CSA), or mixed sleep apnea. A laboratory attended sleep study is considered gold standard for evaluation of sleep disordered breathing and is recommended at this time and clinically justified.   I had a long chat with the patient about my findings and the diagnosis of sleep apnea, particularly OSA, its prognosis and treatment options. We talked about medical/conservative treatments, surgical interventions and non-pharmacological approaches for symptom control. I explained, in particular, the risks and ramifications of untreated moderate to severe OSA, especially with respect to developing cardiovascular disease down the road, including congestive heart failure (CHF), difficult to treat hypertension, cardiac arrhythmias (particularly A-fib), neurovascular complications including TIA, stroke and dementia. Even type 2 diabetes has, in part, been linked to untreated OSA. Symptoms of untreated OSA may include (but may not be limited to) daytime sleepiness, nocturia (i.e. frequent nighttime urination), memory problems, mood irritability and suboptimally controlled or worsening mood disorder such as depression and/or anxiety, lack of energy, lack of motivation, physical discomfort, as well as recurrent headaches, especially morning or nocturnal headaches. We talked about the importance of maintaining a healthy lifestyle and striving for healthy weight.  I recommended the following at this time: sleep study.  I outlined the differences between a laboratory attended sleep study which is considered more comprehensive and accurate over the option of a home sleep test  (HST); the latter may lead to underestimation of sleep disordered breathing in some instances and does not help with diagnosing upper airway resistance syndrome and is not accurate enough to diagnose primary central sleep apnea typically. I explained the different sleep test procedures to the patient in detail and also outlined possible surgical and non-surgical treatment options of OSA, including the use of a pressure airway pressure (PAP) device (ie CPAP, AutoPAP/APAP or BiPAP in certain  circumstances), a custom-made dental device (aka oral appliance, which would require a referral to a specialist dentist or orthodontist typically, and is generally speaking not considered a good choice for patients with full dentures or edentulous state), upper airway surgical options, such as traditional UPPP (which is not considered a first-line treatment) or the Inspire device (hypoglossal nerve stimulator, which would involve a referral for consultation with an ENT surgeon, after careful selection, following inclusion criteria). I explained the PAP treatment option to the patient in detail, as this is generally considered first-line treatment.  The patient indicated that she would be willing to try PAP therapy, if the need arises. I explained the importance of being compliant with PAP treatment, not only for insurance purposes but primarily to improve patient's symptoms symptoms, and for the patient's long term health benefit, including to reduce Her cardiovascular risks longer-term.    We will pick up our discussion about the next steps and treatment options after testing.  We will keep her posted as to the test results by phone call and/or MyChart messaging where possible.  We will plan to follow-up in sleep clinic accordingly as well.  I answered all her questions today and the patient was in agreement.   I encouraged her to call with any interim questions, concerns, problems or updates or email [place] through [facility].   Generally speaking, sleep test authorizations may take up to 2 weeks, sometimes less, sometimes longer, the patient is encouraged to get in touch with [place] if they do not hear back from the sleep lab staff directly within the next 2 weeks.  Thank you very much for allowing me to participate in the care of this nice patient. If I can be of any further assistance to you please do not hesitate to call me at [phone].  Sincerely,   Star Age, MD, PhD

## 2022-05-09 ENCOUNTER — Other Ambulatory Visit: Payer: Self-pay | Admitting: Internal Medicine

## 2022-05-09 DIAGNOSIS — I10 Essential (primary) hypertension: Secondary | ICD-10-CM

## 2022-05-15 ENCOUNTER — Telehealth: Payer: Self-pay | Admitting: Neurology

## 2022-05-15 ENCOUNTER — Telehealth: Payer: Self-pay | Admitting: Internal Medicine

## 2022-05-15 NOTE — Telephone Encounter (Signed)
LVM for pt to call back to schedule   [facility] no auth req ref # [place] on 05/07/22

## 2022-05-15 NOTE — Telephone Encounter (Signed)
*  STAT* If patient is at the pharmacy, call can be transferred to refill team.   1. Which medications need to be refilled? (please list name of each medication and dose if known)   amLODipine (NORVASC) 10 MG tablet    2. Which pharmacy/location (including street and city if local pharmacy) is medication to be sent to?  [place], [place] Phone:  [phone]         3. Do they need a 30 day or 90 day supply?  90 day

## 2022-05-15 NOTE — Telephone Encounter (Signed)
Patient returned my call.  [facility]-  [facility] no auth req ref # Tanya Carr on 05/07/22-pt chose.  She is scheduled at [facility] for 06/04/22 at 8 pm.  Mailed packet to the patient.

## 2022-05-19 NOTE — Telephone Encounter (Signed)
*  STAT* If patient is at the pharmacy, call can be transferred to refill team.   1. Which medications need to be refilled? (please list name of each medication and dose if known) Amlodipine  2. Which pharmacy/location (including street and city if local pharmacy) is medication to be sent to? [facility] [employer], [place]   3. Do they need a 30 day or 90 day supply? 90 days and refills

## 2022-05-24 ENCOUNTER — Emergency Department (HOSPITAL_COMMUNITY): Payer: PRIVATE HEALTH INSURANCE

## 2022-05-24 ENCOUNTER — Encounter (HOSPITAL_COMMUNITY): Payer: Self-pay

## 2022-05-24 ENCOUNTER — Emergency Department (HOSPITAL_COMMUNITY)
Admission: EM | Admit: 2022-05-24 | Discharge: 2022-05-25 | Disposition: A | Discharge status: Home / Self Care | Payer: PRIVATE HEALTH INSURANCE | Attending: Emergency Medicine | Admitting: Emergency Medicine

## 2022-05-24 DIAGNOSIS — R079 Chest pain, unspecified: Secondary | ICD-10-CM

## 2022-05-24 DIAGNOSIS — K8020 Calculus of gallbladder without cholecystitis without obstruction: Secondary | ICD-10-CM | POA: Diagnosis not present

## 2022-05-24 DIAGNOSIS — R072 Precordial pain: Secondary | ICD-10-CM | POA: Diagnosis not present

## 2022-05-24 DIAGNOSIS — N9489 Other specified conditions associated with female genital organs and menstrual cycle: Secondary | ICD-10-CM | POA: Insufficient documentation

## 2022-05-24 LAB — BASIC METABOLIC PANEL
Anion gap: 9 (ref 5–15)
BUN: 15 mg/dL (ref 6–20)
CO2: 26 mmol/L (ref 22–32)
Calcium: 9.6 mg/dL (ref 8.9–10.3)
Chloride: 102 mmol/L (ref 98–111)
Creatinine, Ser: 1.12 mg/dL — ABNORMAL HIGH (ref 0.44–1.00)
GFR, Estimated: 59 mL/min — ABNORMAL LOW (ref >60)
Glucose, Bld: 131 mg/dL — ABNORMAL HIGH (ref 70–99)
Potassium: 3.7 mmol/L (ref 3.5–5.1)
Sodium: 137 mmol/L (ref 135–145)

## 2022-05-24 LAB — CBC
HCT: 43 % (ref 36.0–46.0)
Hemoglobin: 14 g/dL (ref 12.0–15.0)
MCH: 27.3 pg (ref 26.0–34.0)
MCHC: 32.6 g/dL (ref 30.0–36.0)
MCV: 83.8 fL (ref 80.0–100.0)
Platelets: 233 10*3/uL (ref 150–400)
RBC: 5.13 MIL/uL — ABNORMAL HIGH (ref 3.87–5.11)
RDW: 14 % (ref 11.5–15.5)
WBC: 7.9 10*3/uL (ref 4.0–10.5)
nRBC: 0 % (ref 0.0–0.2)

## 2022-05-24 LAB — TROPONIN I (HIGH SENSITIVITY): Troponin I (High Sensitivity): 5 ng/L (ref <18)

## 2022-05-24 NOTE — ED Triage Notes (Signed)
Pt states that for the past few hours she has been having CP with SOB, nausea, dizziness and radiation to back.

## 2022-05-25 ENCOUNTER — Emergency Department (HOSPITAL_COMMUNITY): Payer: PRIVATE HEALTH INSURANCE

## 2022-05-25 LAB — HEPATIC FUNCTION PANEL
ALT: 16 U/L (ref 0–44)
AST: 22 U/L (ref 15–41)
Albumin: 3.9 g/dL (ref 3.5–5.0)
Alkaline Phosphatase: 53 U/L (ref 38–126)
Bilirubin, Direct: <0.1 mg/dL (ref 0.0–0.2)
Total Bilirubin: <0.1 mg/dL — ABNORMAL LOW (ref 0.3–1.2)
Total Protein: 7 g/dL (ref 6.5–8.1)

## 2022-05-25 LAB — TROPONIN I (HIGH SENSITIVITY): Troponin I (High Sensitivity): 5 ng/L (ref <18)

## 2022-05-25 LAB — I-STAT BETA HCG BLOOD, ED (MC, WL, AP ONLY): I-stat hCG, quantitative: <5 m[IU]/mL (ref <5)

## 2022-05-25 LAB — LIPASE, BLOOD: Lipase: 35 U/L (ref 11–51)

## 2022-05-25 NOTE — ED Provider Notes (Signed)
[facility] EMERGENCY DEPARTMENT Provider Note   CSN: [id number] Arrival date & time: 05/24/22  2241     History  Chief Complaint  Patient presents with   Chest Pain    Tanya Carr is a 51 y.o. female.  51 year old female who presents the ER today secondary to chest pain.  Patient states that she woke up tonight with substernal chest discomfort radiated to her back.  She states she was nauseous with that nebula but short of breath as well.  Has improved at this point.  Did not seem to be related to eating as she was lying down when it happened.  No lightheadedness or diaphoresis.  No lower extremity swelling.  No significant history of the same.   Chest Pain      Home Medications Prior to Admission medications   Medication Sig Start Date End Date Taking? Authorizing Provider  amLODipine (NORVASC) 10 MG tablet TAKE 1 TABLET BY MOUTH DAILY. 05/19/22   Elouise Munroe, MD  cyclobenzaprine (FLEXERIL) 10 MG tablet TAKE 1 TABLET BY MOUTH AT BEDTIME AS NEEDED FOR MUSCLE SPASMS. 02/07/22   Nche, Charlene Brooke, NP  ibuprofen (ADVIL) 400 MG tablet Take 1.5 tablets (600 mg total) by mouth every 8 (eight) hours as needed. 11/09/20   Nche, Charlene Brooke, NP  lisinopril (ZESTRIL) 30 MG tablet Take 1 tablet (30 mg total) by mouth daily. 08/09/21   Elouise Munroe, MD      Allergies    Bee pollen, Food, Shellfish allergy, Vit d-vit e-safflower oil, and Vitamin d analogs    Review of Systems   Review of Systems  Cardiovascular:  Positive for chest pain.    Physical Exam Updated Vital Signs BP 138/77   Pulse 72   Temp 97.6 F (36.4 C) (Oral)   Resp 18   LMP 11/10/2011   SpO2 99%  Physical Exam Vitals and nursing note reviewed.  Constitutional:      Appearance: She is well-developed.  HENT:     Head: Normocephalic and atraumatic.  Cardiovascular:     Rate and Rhythm: Normal rate and regular rhythm.  Pulmonary:     Effort: No tachypnea or respiratory  distress.     Breath sounds: No stridor.  Abdominal:     General: There is no distension.  Musculoskeletal:     Cervical back: Normal range of motion.  Neurological:     Mental Status: She is alert.     ED Results / Procedures / Treatments   Labs (all labs ordered are listed, but only abnormal results are displayed) Labs Reviewed  BASIC METABOLIC PANEL - Abnormal; Notable for the following components:      Result Value   Glucose, Bld 131 (*)    Creatinine, Ser 1.12 (*)    GFR, Estimated 59 (*)    All other components within normal limits  CBC - Abnormal; Notable for the following components:   RBC 5.13 (*)    All other components within normal limits  HEPATIC FUNCTION PANEL - Abnormal; Notable for the following components:   Total Bilirubin <0.1 (*)    All other components within normal limits  LIPASE, BLOOD  I-STAT BETA HCG BLOOD, ED ([facility], [facility], [facility] ONLY)  TROPONIN I (HIGH SENSITIVITY)  TROPONIN I (HIGH SENSITIVITY)    EKG EKG Interpretation  Date/Time:  Saturday May 24 2022 23:02:32 EDT Ventricular Rate:  72 PR Interval:  170 QRS Duration: 86 QT Interval:  388 QTC Calculation: 424 R Axis:  37 Text Interpretation: Normal sinus rhythm Normal ECG When compared with ECG of 17-Nov-2011 08:01, PREVIOUS ECG IS PRESENT Confirmed by Merrily Pew [phone]) on 05/25/2022 2:28:57 AM  Radiology US Abdomen Limited RUQ (LIVER/GB)  Result Date: 05/25/2022 CLINICAL DATA:  Abdominal pain EXAM: ULTRASOUND ABDOMEN LIMITED RIGHT UPPER QUADRANT COMPARISON:  None Available. FINDINGS: Gallbladder: Gallbladder is contracted likely due to NPO status. 9 mm stone in the gallbladder neck. No gallbladder wall thickening. No sonographic Murphy sign noted by sonographer. Common bile duct: Diameter: 3 mm.  No intrahepatic biliary ductal dilation. Liver: No focal lesion identified. Within normal limits in parenchymal echogenicity. Portal vein is patent on color Doppler imaging with normal direction  of blood flow towards the liver. Other: None. IMPRESSION: Cholelithiasis without evidence of cholecystitis. Electronically Signed   By: Placido Sou M.D.   On: 05/25/2022 03:23   DG Chest 2 View  Result Date: 05/24/2022 CLINICAL DATA:  Chest pain, shortness of breath EXAM: CHEST - 2 VIEW COMPARISON:  None Available. FINDINGS: The heart size and mediastinal contours are within normal limits. Both lungs are clear. The visualized skeletal structures are unremarkable. IMPRESSION: No active cardiopulmonary disease. Electronically Signed   By: Rolm Baptise M.D.   On: 05/24/2022 23:27    Procedures Procedures    Medications Ordered in ED Medications - No data to display  ED Course/ Medical Decision Making/ A&P                           Medical Decision Making Amount and/or Complexity of Data Reviewed Labs: ordered. Radiology: ordered.  Troponins/ecg reassuring, doubt ACS. Possibly GI? Also consider biliary pathology or less likely pancreatitis. Will add on US/lft/lipase.   US shows cholelithiasis without e/o cholecystitis.   Asymptomatic at this time, will d/c w/ close pcp follow up for biliary colic and elevated creatinine.   Final Clinical Impression(s) / ED Diagnoses Final diagnoses:  Nonspecific chest pain  Calculus of gallbladder without cholecystitis without obstruction    Rx / DC Orders ED Discharge Orders     None         Tayjah Lobdell, Corene Cornea, MD 05/26/22 [phone]

## 2022-05-26 ENCOUNTER — Telehealth: Payer: Self-pay

## 2022-05-26 NOTE — Telephone Encounter (Addendum)
Transition Care Management Follow-up Telephone Call Date of discharge and from where: 05/25/22 [facility] How have you been since you were released from the hospital? I've been fine, I have gall stones, they didn't really do anything, they talked to me about a low fat diet, and I don't need anything. Any questions or concerns? No  Items Reviewed: Did the pt receive and understand the discharge instructions provided?  "I didn't really read it to be honest. Do I need to do something?" Medications obtained and verified?  N/a Other? No  Any new allergies since your discharge? No  Dietary orders reviewed? Yes Do you have support at home? Yes   Home Care and Equipment/Supplies: Were home health services ordered? not applicable If so, what is the name of the agency? N/a  Has the agency set up a time to come to the patient's home? not applicable Were any new equipment or medical supplies ordered?  No What is the name of the medical supply agency? N/a Were you able to get the supplies/equipment? not applicable Do you have any questions related to the use of the equipment or supplies? No  Functional Questionnaire: (I = Independent and D = Dependent) ADLs: I  Bathing/Dressing- I  Meal Prep- I  Eating- I  Maintaining continence- I  Transferring/Ambulation- I  Managing Meds- I  Follow up appointments reviewed:  PCP Hospital f/u appt confirmed?  Pt refused at this time. States, "If I need anything I will call and schedule an appt, but I don't need an appt right now."  Pt called the office to schedule hosp f/u Scheduled to see Wilfred Lacy on 06/04/22 @ [facility] f/u appt confirmed?  N/a  Scheduled to see n/a on n/a @ n/a. Are transportation arrangements needed? No  If their condition worsens, is the pt aware to call PCP or go to the Emergency Dept.? Yes Was the patient provided with contact information for the PCP's office or ED? Yes Was to pt encouraged to call back with  questions or concerns? Yes  Angeline Slim, RN, BSN

## 2022-06-04 ENCOUNTER — Ambulatory Visit (INDEPENDENT_AMBULATORY_CARE_PROVIDER_SITE_OTHER): Payer: No Typology Code available for payment source | Admitting: Neurology

## 2022-06-04 ENCOUNTER — Ambulatory Visit (INDEPENDENT_AMBULATORY_CARE_PROVIDER_SITE_OTHER): Payer: No Typology Code available for payment source | Admitting: Nurse Practitioner

## 2022-06-04 ENCOUNTER — Encounter: Payer: Self-pay | Admitting: Nurse Practitioner

## 2022-06-04 VITALS — BP 126/82 | HR 66 | Temp 97.4°F | Ht 64.0 in | Wt 200.0 lb

## 2022-06-04 DIAGNOSIS — G4720 Circadian rhythm sleep disorder, unspecified type: Secondary | ICD-10-CM

## 2022-06-04 DIAGNOSIS — K8020 Calculus of gallbladder without cholecystitis without obstruction: Secondary | ICD-10-CM | POA: Diagnosis not present

## 2022-06-04 DIAGNOSIS — G4719 Other hypersomnia: Secondary | ICD-10-CM

## 2022-06-04 DIAGNOSIS — I10 Essential (primary) hypertension: Secondary | ICD-10-CM | POA: Diagnosis not present

## 2022-06-04 DIAGNOSIS — R0683 Snoring: Secondary | ICD-10-CM

## 2022-06-04 DIAGNOSIS — E669 Obesity, unspecified: Secondary | ICD-10-CM

## 2022-06-04 DIAGNOSIS — R351 Nocturia: Secondary | ICD-10-CM

## 2022-06-04 DIAGNOSIS — Z9189 Other specified personal risk factors, not elsewhere classified: Secondary | ICD-10-CM

## 2022-06-04 DIAGNOSIS — R1013 Epigastric pain: Secondary | ICD-10-CM | POA: Diagnosis not present

## 2022-06-04 DIAGNOSIS — G4733 Obstructive sleep apnea (adult) (pediatric): Secondary | ICD-10-CM | POA: Diagnosis not present

## 2022-06-04 DIAGNOSIS — Z820 Family history of epilepsy and other diseases of the nervous system: Secondary | ICD-10-CM

## 2022-06-04 LAB — BASIC METABOLIC PANEL
BUN: 16 mg/dL (ref 6–23)
CO2: 29 mEq/L (ref 19–32)
Calcium: 9.3 mg/dL (ref 8.4–10.5)
Chloride: 101 mEq/L (ref 96–112)
Creatinine, Ser: 0.84 mg/dL (ref 0.40–1.20)
GFR: 79.58 mL/min (ref >60.00)
Glucose, Bld: 84 mg/dL (ref 70–99)
Potassium: 3.7 mEq/L (ref 3.5–5.1)
Sodium: 138 mEq/L (ref 135–145)

## 2022-06-04 MED ORDER — OMEPRAZOLE 20 MG PO CPDR: 20.0000 mg | DELAYED_RELEASE_CAPSULE | Freq: Every day | ORAL | 0 refills | Status: DC

## 2022-06-04 MED ORDER — AMLODIPINE BESYLATE 10 MG PO TABS: 10.0000 mg | ORAL_TABLET | Freq: Every day | ORAL | 3 refills | Status: DC

## 2022-06-04 MED ORDER — LISINOPRIL 30 MG PO TABS: 30.0000 mg | ORAL_TABLET | Freq: Every day | ORAL | 3 refills | Status: DC

## 2022-06-04 NOTE — Patient Instructions (Signed)
Go to lab Maintain low fat diet  Cholelithiasis  Cholelithiasis happens when gallstones form in the gallbladder. The gallbladder stores bile. Bile is a fluid that helps digest fats. Bile can harden and form into gallstones. If they cause a blockage, they can cause pain (gallbladder attack). What are the causes? This condition may be caused by: Some blood diseases, such as sickle cell anemia. Too much of a fat-like substance (cholesterol) in your bile. Not enough bile salts in your bile. These salts help the body absorb and digest fats. The gallbladder not emptying fully or often enough. This is common in pregnant women. What increases the risk? The following factors may make you more likely to develop this condition: Being female. Being pregnant many times. Eating a lot of fried foods, fat, and refined carbs (refined carbohydrates). Being very overweight (obese). Being older than age 50. Using medicines with female hormones in them for a long time. Losing weight fast. Having gallstones in your family. Having some health problems, such as diabetes, Crohn's disease, or liver disease. What are the signs or symptoms? Often, there may be gallstones but no symptoms. These gallstones are called silent gallstones. If a gallstone causes a blockage, you may get sudden pain. The pain: Can be in the upper right part of your belly (abdomen). Normally comes at night or after you eat. Can last an hour or more. Can spread to your right shoulder, back, or chest. Can feel like discomfort, burning, or fullness in the upper part of your belly (indigestion). If the blockage lasts more than a few hours, you can get an infection or swelling. You may: Feel like you may vomit. Vomit. Feel bloated. Have belly pain for 5 hours or more. Feel tender in your belly, often in the upper right part and under your ribs. Have fever or chills. Have skin or the white parts of your eyes turn yellow (jaundice). Have  dark pee (urine) or pale poop (stool). How is this treated? Treatment for this condition depends on how bad you feel. If you have symptoms, you may need: Home care, if symptoms are not very bad. Do not eat for 12-24 hours. Drink only water and clear liquids. Start to eat simple or clear foods after 1 or 2 days. Try broths and crackers. You may need medicines for pain or stomach upset or both. If you have an infection, you will need antibiotics. A hospital stay, if you have very bad pain or a very bad infection. Surgery to remove your gallbladder. You may need this if: Gallstones keep coming back. You have very bad symptoms. Medicines to break up gallstones. Medicines: Are best for small gallstones. May be used for up to 6-12 months. A procedure to find and take out gallstones or to break up gallstones. Follow these instructions at home: Medicines Take over-the-counter and prescription medicines only as told by your doctor. If you were prescribed an antibiotic medicine, take it as told by your doctor. Do not stop taking the antibiotic even if you start to feel better. Ask your doctor if the medicine prescribed to you requires you to avoid driving or using machinery. Eating and drinking Drink enough fluid to keep your urine pale yellow. Drink water or clear fluids. This is important when you have pain. Eat healthy foods. Choose: Fewer fatty foods, such as fried foods. Fewer refined carbs. Avoid breads and grains that are highly processed, such as white bread and white rice. Choose whole grains, such as whole-wheat bread and  brown rice. More fiber. Almonds, fresh fruit, and beans are healthy sources. General instructions Keep a healthy weight. Keep all follow-up visits as told by your doctor. This is important. Where to find more information [employer] of Diabetes and Digestive and Kidney Diseases: [email or web address] Contact a doctor if: You have sudden pain in the upper right  part of your belly. Pain might spread to your right shoulder, back, or chest. You have been diagnosed with gallstones that have no symptoms and you get: Belly pain. Discomfort, burning, or fullness in the upper part of your abdomen. You have dark urine or pale stools. Get help right away if: You have sudden pain in the upper right part of your abdomen, and the pain lasts more than 2 hours. You have pain in your abdomen, and: It lasts more than 5 hours. It keeps getting worse. You have a fever or chills. You keep feeling like you may vomit. You keep vomiting. Your skin or the white parts of your eyes turn yellow. Summary Cholelithiasis happens when gallstones form in the gallbladder. This condition may be caused by a blood disease, too much of a fat-like substance in the bile, or not enough bile salts in bile. Treatment for this condition depends on how bad you feel. If you have symptoms, do not eat or drink. You may need medicines. You may need a hospital stay for very bad pain or a very bad infection. You may need surgery if gallstones keep coming back or if you have very bad symptoms. This information is not intended to replace advice given to you by your health care provider. Make sure you discuss any questions you have with your health care provider. Document Revised: 10/07/2019 Document Reviewed: 07/11/2019 Elsevier Patient Education  [employer].

## 2022-06-04 NOTE — Assessment & Plan Note (Addendum)
85m per ABD [place] 05/25/2022. she presenedt to ED 05/24/2022 with epigastric pain radiating around lower chest: pressure and bloating creating band around chest. Onset after eating dinner (meatloaf and ice cream). Normal CMP, cbc, lipase. She had similar symptoms in past, but they were mild and resolved with use of TUMs. Previous episodes trigger by consumption of dairy products.. Today she denies any symptoms since ED visit. She has made dieatry modifications: low fat.  Advised to maintain low fat/low carb diet, and avoid diary.  May need referral to GI if symptoms reoccur.

## 2022-06-04 NOTE — Assessment & Plan Note (Signed)
BP at goal with amlodipine and lisinopril Mildly elevated creatinine and decrease GFr with recent lab results BP Readings from Last 3 Encounters:  06/04/22 126/82  05/25/22 138/77  04/28/22 132/80   Med refill sent Repeat BMP, maintain DAS diet and adequate oral hydration

## 2022-06-04 NOTE — Progress Notes (Signed)
Established Patient Visit  Patient: Tanya Carr   DOB: [birth year]   51 y.o. Female  MRN: [id number] Visit Date: 06/04/2022  Subjective:    Chief Complaint  Patient presents with   [facility] F/u , gallstones 05/24/2022 Needs refill on amlodipine  No concerns    HPI Calculus of gallbladder without cholecystitis without obstruction 79m per ABD [place] 05/25/2022. she presenedt to ED 05/24/2022 with epigastric pain radiating around lower chest: pressure and bloating creating band around chest. Onset after eating dinner (meatloaf and ice cream). Normal CMP, cbc, lipase. She had similar symptoms in past, but they were mild and resolved with use of TUMs. Previous episodes trigger by consumption of dairy products.. Today she denies any symptoms since ED visit. She has made dieatry modifications: low fat.  Advised to maintain low fat/low carb diet, and avoid diary.  May need referral to GI if symptoms reoccur.  HTN (hypertension) BP at goal with amlodipine and lisinopril Mildly elevated creatinine and decrease GFr with recent lab results BP Readings from Last 3 Encounters:  06/04/22 126/82  05/25/22 138/77  04/28/22 132/80   Med refill sent Repeat BMP, maintain DAS diet and adequate oral hydration  Reviewed medical, surgical, and social history today  Medications: Outpatient Medications Prior to Visit  Medication Sig   cyclobenzaprine (FLEXERIL) 10 MG tablet TAKE 1 TABLET BY MOUTH AT BEDTIME AS NEEDED FOR MUSCLE SPASMS.   ibuprofen (ADVIL) 400 MG tablet Take 1.5 tablets (600 mg total) by mouth every 8 (eight) hours as needed.   [DISCONTINUED] amLODipine (NORVASC) 10 MG tablet TAKE 1 TABLET BY MOUTH DAILY.   [DISCONTINUED] lisinopril (ZESTRIL) 30 MG tablet Take 1 tablet (30 mg total) by mouth daily.   No facility-administered medications prior to visit.   Reviewed past medical and social history.   ROS per HPI above  Last CBC Lab Results   Component Value Date   WBC 7.9 05/24/2022   HGB 14.0 05/24/2022   HCT 43.0 05/24/2022   MCV 83.8 05/24/2022   MCH 27.3 05/24/2022   RDW 14.0 05/24/2022   PLT 233 016/60/6301  Last metabolic panel Lab Results  Component Value Date   GLUCOSE 131 (H) 05/24/2022   NA 137 05/24/2022   K 3.7 05/24/2022   CL 102 05/24/2022   CO2 26 05/24/2022   BUN 15 05/24/2022   CREATININE 1.12 (H) 05/24/2022   GFRNONAA 59 (L) 05/24/2022   CALCIUM 9.6 05/24/2022   PROT 7.0 05/25/2022   ALBUMIN 3.9 05/25/2022   LABGLOB 3.1 10/24/2014   AGRATIO 1.4 10/24/2014   BILITOT <0.1 (L) 05/25/2022   ALKPHOS 53 05/25/2022   AST 22 05/25/2022   ALT 16 05/25/2022   ANIONGAP 9 05/24/2022      Objective:  BP 126/82 (BP Location: Right Arm, Patient Position: Sitting, Cuff Size: Normal)   Pulse 66   Temp (!) 97.4 F (36.3 C) (Temporal)   Ht '5\' 4"'$  (1.626 m)   Wt 200 lb (90.7 kg)   LMP 11/10/2011   SpO2 97%   BMI 34.33 kg/m      Physical Exam Cardiovascular:     Rate and Rhythm: Normal rate.     Pulses: Normal pulses.  Pulmonary:     Effort: Pulmonary effort is normal.  Abdominal:     General: There is no distension.     Tenderness: There is no abdominal tenderness.  Neurological:     Mental Status: She is alert.     No results found for any visits on 06/04/22.    Assessment & Plan:    Problem List Items Addressed This Visit       Cardiovascular and Mediastinum   HTN (hypertension) - Primary    BP at goal with amlodipine and lisinopril Mildly elevated creatinine and decrease GFr with recent lab results BP Readings from Last 3 Encounters:  06/04/22 126/82  05/25/22 138/77  04/28/22 132/80   Med refill sent Repeat BMP, maintain DAS diet and adequate oral hydration      Relevant Medications   amLODipine (NORVASC) 10 MG tablet   lisinopril (ZESTRIL) 30 MG tablet   Other Relevant Orders   Basic metabolic panel     Digestive   Calculus of gallbladder without cholecystitis  without obstruction    49m per ABD [place] 05/25/2022. she presenedt to ED 05/24/2022 with epigastric pain radiating around lower chest: pressure and bloating creating band around chest. Onset after eating dinner (meatloaf and ice cream). Normal CMP, cbc, lipase. She had similar symptoms in past, but they were mild and resolved with use of TUMs. Previous episodes trigger by consumption of dairy products.. Today she denies any symptoms since ED visit. She has made dieatry modifications: low fat.  Advised to maintain low fat/low carb diet, and avoid diary.  May need referral to GI if symptoms reoccur.      Other Visit Diagnoses     Epigastric pain       Relevant Medications   omeprazole (PRILOSEC) 20 MG capsule      Return in about 6 months (around 12/04/2022) for CPE (fasting).     CWilfred Lacy NP

## 2022-06-13 NOTE — Progress Notes (Signed)
See procedure note.

## 2022-06-13 NOTE — Procedures (Signed)
Physician Interpretation:     [facility] SPLIT NIGHT INTERPRETATION REPORT   STUDY DATE: 06/04/2022     PATIENT NAME:  Tanya Carr, Tanya Carr         DATE OF BIRTH:  23-Jan-1971  PATIENT ID:  [id number]    TYPE OF STUDY:  PSG  READING PHYSICIAN: Star Age, MD, PhD SCORING TECHNICIAN: Gaylyn Cheers   History: 51 year old right-handed woman with an underlying medical history of hypertension, degenerative lumbar disc disease, vitamin D deficiency, anxiety, sinus bradycardia, and obesity, who reports snoring and tiredness. ?Her Epworth sleepiness score is 3 out of 24, fatigue severity score is 22 out of 63.  Height: 64.0 in Weight: 201 lb (BMI 34) Neck Size: 13.6 in Medications: Norvasc, Flexeril, Advil, Zestril  DESCRIPTION: A sleep technologist was in attendance for the duration of the recording.  Data collection, scoring, video monitoring, and reporting were performed in compliance with the AASM Manual for the Scoring of Sleep and Associated Events; (Hypopnea is scored based on the criteria listed in Section VIII D. 1b in the AASM Manual V2.6 using a 4% oxygen desaturation rule or Hypopnea is scored based on the criteria listed in Section VIII D. 1a in the AASM Manual V2.6 using 3% oxygen desaturation and /or arousal rule).  A physician certified by the American Board of Sleep Medicine reviewed each epoch of the study.  STUDY DETAILS: Lights off was at 20:59: and lights on 04:59: (477 minutes hours in bed). This study was performed with an initial diagnostic portion followed by positive airway pressure titration. The patient qualified for an emergency split sleep study secondary to severe sleep disordered breathing. The baseline AHI was 40/h, O2 nadir 76%.   DIAGNOSTIC ANALYSIS   SLEEP CONTINUITY AND SLEEP ARCHITECTURE:  The diagnostic portion of the study began at 20:59 and ended at 01:41, for a recording time of 4h 39.49mminutes.  Total sleep time was 126  minutes minutes (12.7% supine;  87.3% lateral;  0.0% prone, 0.0% REM sleep), with a decreased sleep efficiency at 45.2%. Sleep latency was increased at 49.0 minutes. REM sleep latency was decreased at 0.0 minutes.  Arousal index was 40.0 /hr. Of the total sleep time, the percentage of stage N1 sleep was 18.3%, which is markedly increased, stage N2 sleep was 78.6%, which is markedly increased, stage N3 sleep was 3.2%, and REM sleep was absent. Wake after sleep onset (WASO) time accounted for 104 minutes with moderate sleep fragmentation noted.    AROUSAL (Baseline): There were 63.0 arousals in total, for an arousal index of 30.0 arousals/hour.  Of these, 31.0 were identified as respiratory-related arousals (14.8 /hr), 0 were PLM-related arousals (0.0 /hr), and 53 were non-specific arousals (25.2 /hr)   RESPIRATORY MONITORING:  Based on CMS criteria (using a 4% oxygen desaturation rule for scoring hypopneas), there were 7 apneas (7 obstructive; 0 central; 0 mixed), and 66 hypopneas.  Apnea index was 3.3. Hypopnea index was 30.0. The apnea-hypopnea index was 33.3 overall (5.7 supine; 0.0 REM, 0.0 supine REM). There were 0 respiratory effort-related arousals (RERAs).  The RERA index was 0.0 events/hr. Total respiratory disturbance index (RDI) was 33.3 events/hr. RDI results showed: supine RDI  45.0 /hr; non-supine RDI 31.6 /hr; REM RDI 0.0 /hr, supine REM RDI 0.0 /hr.  Based on AASM criteria (using a 3% oxygen desaturation and /or arousal rule for scoring hypopneas), there were 7 apneas (7 obstructive; 0 central; 0 mixed), and 66 hypopneas.  Apnea index was 3.3. Hypopnea index  was 36.7. The apnea-hypopnea index was 40.0 overall (6.2 supine; 0.0 REM, 0.0 supine REM). There were 0 respiratory effort-related arousals (RERAs). Total respiratory disturbance index (RDI) was 40.0 events/hr. RDI results showed: supine RDI 48.8 /hr; non-supine RDI 38.7 /hr; REM RDI 0.0 /hr, supine REM RDI 0.0 /hr.   Respiratory events  were associated with oxyhemoglobin desaturations (nadir 76%) from a normal baseline (mean 95%). Total time spent at, or below 88% was 10.6 minutes, or 8.4%  of total sleep time. Snoring was absent. There were 0.0 occurrences of Cheyne Stokes breathing.   LIMB MOVEMENTS: There were 0 periodic limb movements of sleep (0.0/hr), of which 0 (0.0/hr) were associated with an arousal.   OXIMETRY: Total sleep time spent at, or below 88% was 3.4 minutes, or 2.7% of total sleep time. Snoring was classified as .   BODY POSITION: Duration of total sleep and percent of total sleep in their respective position is as follows: supine 16 minutes minutes (12.7%), non-supine 110.0 minutes (87.3%); right 71 minutes minutes (56.3%), left 39 minutes minutes (31.0%), and prone 00 minutes minutes (0.0%). Total supine REM sleep time was 00 minutes minutes (0.0% of total REM sleep).   Analysis of electrocardiogram activity showed the highest heart rate for the baseline portion of the study was 100.0 beats per minute.  The average heart rate during sleep was 52 bpm, while the highest heart rate for the same period was 66 bpm.    TREATMENT ANALYSIS SLEEP CONTINUITY AND SLEEP ARCHITECTURE:  The treatment portion of the study began at 01:41 and ended at 04:59, for a recording time of 3h 18.76mminutes.  Total sleep time was 42 minutes minutes (11.8% supine;  88.2% lateral; 0.0% prone, 25.9% REM sleep), with a decreased sleep efficiency at 21.5%. Sleep latency was increased at 100.5 minutes. REM sleep latency was decreased at 41.0 minutes.  Arousal index was 40.9 /hr. Of the total sleep time, the percentage of stage N1 sleep was 40.0%, stage N3 sleep was absent, and REM sleep was 25.9%. There were 1 Stage R periods observed during this portion of the study, 14 awakenings (i.e. transitions to Stage W from any sleep stage), and 43.0 total stage transitions. Wake after sleep onset (WASO) time accounted for 54 minutes with moderate sleep  fragmentation noted.   AROUSAL: There were 20.0 arousals in total, for an arousal index of 28.2 arousals/hour.  Of these, 0.0 were identified as respiratory-related arousals (0.0 /hr), 0 were PLM-related arousals (0.0 /hr), and 29 were non-specific arousals (40.9 /hr)  RESPIRATORY MONITORING:    While on PAP therapy, based on CMS criteria, the apnea-hypopnea index was 4.2 overall (0.0 supine; 2.8 REM).   While on PAP therapy, based on AASM criteria, the apnea-hypopnea index was 4.2 overall (0.0 supine; 2.8 REM).   Respiratory events were associated with oxyhemoglobin desaturation (nadir 85.0%) from a mean of 98.0%.  Total time spent at, or below 88% was 6.9 minutes, or 16.3%  of total sleep time.  Snoring was absent:  . There were 0.0 occurrences of Cheyne Stokes breathing.  LIMB MOVEMENTS: There were 0 periodic limb movements of sleep (0.0/hr), of which 0 (0.0/hr) were associated with an arousal.   OXIMETRY: Total sleep time spent at, or below 88% was 0.0 minutes, or 0.0% of total sleep time.    BODY POSITION: Duration of total sleep and percent of total sleep in their respective position is as follows: supine 05 minutes minutes (11.8%), non-supine 37.5 minutes (88.2%); right 07 minutes minutes (  16.5%), left 30 minutes minutes (71.8%), and prone 00 minutes minutes (0.0%). Total supine REM sleep time was 00 minutes minutes (0.0% of total REM sleep).   TITRATION DETAILS (SEE ALSO TABLE AT THE END OF THE REPORT):  The patient was shown several different interfaces and was subsequently fitted with a small P10 nasal pillows mask from [employer].  The patient was started on a pressure of 5 cm of water pressure and gradually titrated to a final titration pressure of 7 cm. The AHI was improved and optimized at a pressure of 7 cm, on which the patient achieved a total sleep time of  35 minutes.  Residual AHI was 3.43/hour, O2 nadir of 92%, with nonsupine REM sleep achieved.    EEG: Review of the EEG showed  no abnormal electrical discharges and symmetrical bihemispheric findings.   EKG: The EKG revealed normal sinus rhythm (NSR). The average heart rate during sleep was 51 bpm.  The maximum heart rate during sleep was 68 bpm. The maximum heart rate during recording was 100.  AUDIO/VIDEO:  The audio and video review did not show any abnormal or unusual behaviors, movements, phonations or vocalizations. The patient took 2 restroom breaks for the night. POST-STUDY QUESTIONNAIRE: Post study, the patient indicated, that sleep was the same as usual.    IMPRESSION:   1. Severe Obstructive sleep apnea (OSA) 2. Dysfunctions associated with sleep stages or arousal from sleep   RECOMMENDATIONS:  1. This patient has severe obstructive sleep apnea and responded well on CPAP therapy. The patient qualified for an emergency split sleep study secondary to severe sleep disordered breathing. The baseline AHI was 40/h, O2 nadir 76%. The absence of REM sleep during the diagnostic portion of the study likely underestimates her sleep disordered breathing. CPAP of 7 cm resulted in significant reduction of her sleep disordered breathing.  I recommend home CPAP therapy at a pressure of 7 cm via small P10 nasal pillows with heated humidity, or mask of choice, sized to fit. The patient will be advised to be fully compliant with PAP therapy to improve sleep related symptoms and decrease long term cardiovascular risks. Please note that untreated obstructive sleep apnea may carry additional perioperative morbidity. Patients with significant obstructive sleep apnea should receive perioperative PAP therapy and the surgeons and particularly the anesthesiologist should be informed of the diagnosis and the severity of the sleep disordered breathing. 2. This study shows significant sleep fragmentation and abnormal sleep stage percentages; these are nonspecific findings and per se do not signify an intrinsic sleep disorder or a cause for the  patient's sleep-related symptoms. Causes include (but are not limited to) the first night effect of the sleep study, circadian rhythm disturbances, medication effect or an underlying mood disorder or medical problem.  3. The patient should be cautioned not to drive, work at heights, or operate dangerous or heavy equipment when tired or sleepy. Review and reiteration of good sleep hygiene measures should be pursued with any patient. 4. The patient will be seen in follow-up in the sleep clinic at [facility] for discussion of the test results, symptom and treatment compliance review, further management strategies, etc. The referring provider will be notified of the test results.   I certify that I have reviewed the entire raw data recording prior to the issuance of this report in accordance with the Standards of Accreditation of the American Academy of Sleep Medicine (AASM).   Star Age, MD, PhD [facility]) Diplomat, ABPN (Neurology and Sleep)  Technical Report:   [facility] Split Summary    General Information  Name: Laci, Frenkel BMI: 34.50 Physician: Star Age, MD  ID: [id number] Height: 64.0 in Technician: Gaylyn Cheers, RPSGT  Sex: Female Weight: 201.0 lb Record: [id number] Age: 5854[108-30-1972 Date: 06/04/2022     Medical & Medication History    Ms. MMeiseris a 51year old right-handed woman with an underlying medical history of hypertension, degenerative lumbar disc disease, vitamin D deficiency, anxiety, sinus bradycardia, and obesity, who reports snoring and some daytime tiredness. There is snoring is disturbing to her husband and loud enough for him to sleep in a different bedroom. Her Epworth sleepiness score is 3 out of 24, fatigue severity score is 22 out of 63. Norvasc, Flexeril, Advil, Zestril   Sleep Disorder      Comments   Patient arrived for a diagnostic polysomnogram. Procedure  explained and all questions answered. Standard paste setup without complications. Patient slept supine, right, and left. Occasional mild to moderate audible snoring was heard. Respiratory events observed. After two hours total sleep time, AHI = 41. Patient was shown CPAP at 5cm with a small Vitera FFM, a small Eson 2 nasal mask and a small P-10 nasal pillows mask. CPAP was started at 5cm with heated humidity using a small P-10 nasal pillows mask. EPR was turned on at 2 to try and promote return to sleep. CPAP increased to 7 cm in an effort to control obstructive respiratory events and abolish snoring. Decreased sleep efficiency prevented a proper titration. No obvious cardiac arrhythmias observed. No significant PLMS observed. Two restroom visits.   Baseline Sleep Stage Information Baseline start time: 08:59:14 PM Baseline end time: 01:41:55 AM   Time Total Supine Side Prone Upright  Recording 4h 39.062mh 41.66m44m 57.66m 26m0.37m 0466m.37m  S66mp 2h 6.37m 0h 17m37m 1h 58mm 0h 0.137m0h 0.0366m Laten3mN1 N2 N3 REM Onset Per. Slp. Eff.  Actual 0h 0.37m 0h 2.66m 24m54.37m 81m0.37m 0h47m.37m 2h13m37m 45.129m  Stg 23m Wake N1 N2 N3 REM  Total 153.0 23.0 99.0 4.0 0.0  Supine 85.5 6.0 10.0 0.0 0.0  Side 67.5 17.0 89.0 4.0 0.0  Prone 0.0 0.0 0.0 0.0 0.0  Upright 0.0 0.0 0.0 0.0 0.0   Stg % Wake N1 N2 N3 REM  Total 54.8 18.3 78.6 3.2 0.0  Supine 30.6 4.8 7.9 0.0 0.0  Side 24.2 13.5 70.6 3.2 0.0  Prone 0.0 0.0 0.0 0.0 0.0  Upright 0.0 0.0 0.0 0.0 0.0    CPAP Sleep Stage Information CPAP start time: 01:41:55 AM CPAP end time: 04:59:58 AM   Time Total Supine Side Prone Upright  Recording (TRT) 3h 18.37m 1h 1.66m 2h 16.737m0h 0.076mh 0.37m 566meep (T73m 0h 431m 0h 5.37m 0h 38.37m 0h266m37m 0h 71mm   Lat74my N1 N27m3 REM 266met Per. Slp. Eff.  Actual 0h 0.37m 0h 14.66m 0h 0.37m 0h 41.037mh 40.66m71m 17.37m266m.72%   72m Dur Wa37mN1 N2 N92mEM  Total 155.0 17.0 14.5 0.0 11.0  Supine 56.5 3.0 2.0 0.0 0.0  Side 98.5 14.0  12.5 0.0 11.0  Prone 0.0 0.0 0.0 0.0 0.0  Upright 0.0 0.0 0.0 0.0 0.0   Stg % Wake N1 N2 N3 REM  Total 78.3 39.5 33.7 0.0 25.6  Supine 28.5 7.0 4.7 0.0 0.0  Side 49.7 32.6 29.1 0.0 25.6  Prone 0.0 0.0 0.0 0.0 0.0  Upright 0.0 0.0 0.0 0.0 0.0    Baseline Respiratory Information Apnea Summary Sub Supine Side Prone Upright  Total 7 Total '7 1 6 '$ 0 0    REM 0 0 0 0 0    NREM '7 1 6 '$ 0 0  Obs 7 REM 0 0 0 0 0    NREM '7 1 6 '$ 0 0  Mix 0 REM 0 0 0 0 0    NREM 0 0 0 0 0  Cen 0 REM 0 0 0 0 0    NREM 0 0 0 0 0   Rera Summary Sub Supine Side Prone Upright  Total 0 Total 0 0 0 0 0    REM 0 0 0 0 0    NREM 0 0 0 0 0   Hypopnea Summary Sub Supine Side Prone Upright  Total 77 Total 77 12 65 0 0    REM 0 0 0 0 0    NREM 77 12 65 0 0   4% Hypopnea Summary Sub Supine Side Prone Upright  Total (4%) 63 Total 63 11 52 0 0    REM 0 0 0 0 0    NREM 63 11 52 0 0     AHI Total Obs Mix Cen  40.00 Apnea 3.33 3.33 0.00 0.00   Hypopnea 36.67 -- -- --  33.33 Hypopnea (4%) 30.00 -- -- --    Total Supine Side Prone Upright  Position AHI 40.00 48.75 38.73 0.00 0.00  REM AHI 0.00   NREM AHI 40.00   Position RDI 40.00 48.75 38.73 0.00 0.00  REM RDI 0.00   NREM RDI 40.00    4% Hypopnea Total Supine Side Prone Upright  Position AHI (4%) 33.33 45.00 31.64 0.00 0.00  REM AHI (4%) 0.00   NREM AHI (4%) 33.33   Position RDI (4%) 33.33 45.00 31.64 0.00 0.00  REM RDI (4%) 0.00   NREM RDI (4%) 33.33    CPAP Respiratory Information Apnea Summary Sub Supine Side Prone Upright  Total 0 Total 0 0 0 0 0    REM 0 0 0 0 0    NREM 0 0 0 0 0  Obs 0 REM 0 0 0 0 0    NREM 0 0 0 0 0  Mix 0 REM 0 0 0 0 0    NREM 0 0 0 0 0  Cen 0 REM 0 0 0 0 0    NREM 0 0 0 0 0   Rera Summary Sub Supine Side Prone Upright  Total 0 Total 0 0 0 0 0    REM 0 0 0 0 0    NREM 0 0 0 0 0   Hypopnea Summary Sub Supine Side Prone Upright  Total 3 Total 3 0 3 0 0    REM 2 0 2 0 0    NREM 1 0 1 0 0   4% Hypopnea Summary Sub Supine  Side Prone Upright  Total (4%) 3 Total 3 0 3 0 0    REM 2 0 2 0 0    NREM 1 0 1 0 0     AHI Total Obs Mix Cen  4.19 Apnea 0.00 0.00 0.00 0.00   Hypopnea 4.19 -- -- --  4.19 Hypopnea (4%) 4.19 -- -- --    Total Supine Side Prone Upright  Position AHI 4.19 0.00 4.74 0.00 0.00  REM AHI 10.91   NREM AHI 1.90   Position RDI  4.19 0.00 4.74 0.00 0.00  REM RDI 10.91   NREM RDI 1.90    4% Hypopnea Total Supine Side Prone Upright  Position AHI (4%) 4.19 0.00 4.74 0.00 0.00  REM AHI (4%) 10.91   NREM AHI (4%) 1.90   Position RDI (4%) 4.19 0.00 4.74 0.00 0.00  REM RDI (4%) 10.91   NREM RDI (4%) 1.90    Desaturation Information (Baseline)  <100% <90% <80% <70% <60% <50% <40%  Supine 54 20 2 0 0 0 0  Side 93 35 0 0 0 0 0  Prone 0 0 0 0 0 0 0  Upright 0 0 0 0 0 0 0  Total 147 55 2 0 0 0 0  Desaturation threshold setting: 3% Minimum desaturation setting: 10 seconds SaO2 nadir: 57% The longest event was a 30 sec obstructive Hypopneawith a minimum SaO2 of 89%. The lowest SaO2 was 76% associated with a 19 sec obstructive Hypopnea. Awakening/Arousal Information (Baseline) # of Awakenings 29  Wake after sleep onset 104.27m Wake after persistent sleep 54.532m Arousal Assoc. Arousals Index  Apneas 3 1.4  Hypopneas 28 13.3  Leg Movements 0 0.0  Snore 0.0 0.0  PTT Arousals 0 0.0  Spontaneous 53 25.2  Total 84 40.0   Desaturation Information (CPAP)  <100% <90% <80% <70% <60% <50% <40%  Supine 27 3 0 0 0 0 0  Side 40 7 0 0 0 0 0  Prone 0 0 0 0 0 0 0  Upright 0 0 0 0 0 0 0  Total 67 10 0 0 0 0 0  Desaturation threshold setting: 3% Minimum desaturation setting: 10 seconds SaO2 nadir: 60% The longest event was a 15 sec obstructive Hypopnea with a minimum SaO2 of 92%. The lowest SaO2 was 92% associated with a 14 sec obstructive Hypopnea. Awakening/Arousal Information (CPAP) # of Awakenings 14  Wake after sleep onset 54.73m50make after persistent sleep 31.73m 33mrousal Assoc.  Arousals Index  Apneas 0 0.0  Hypopneas 0 0.0  Leg Movements 0 0.0  Snore 0.0 0.0  PTT Arousals 0 0.0  Spontaneous 30 41.9  Total 30 41.9     EKG Rates (Baseline) EKG Avg Max Min  Awake 64 112 46  Asleep 52 66 44  EKG Events: Tachycardia Myoclonus Information (Baseline) PLMS LMs Index  Total LMs during PLMS 0 0.0  LMs w/ Microarousals 0 0.0   LM LMs Index  w/ Microarousal 0 0.0  w/ Awakening 0 0.0  w/ Resp Event 0 0.0  Spontaneous 0 0.0  Total 0 0.0   EKG Rates (CPAP) EKG Avg Max Min  Awake 55 94 43  Asleep 49 68 41  EKG Events: Tachycardia Myoclonus Information (CPAP) PLMS LMs Index  Total LMs during PLMS 0 0.0  LMs w/ Microarousals 0 0.0   LM LMs Index  w/ Microarousal 0 0.0  w/ Awakening 0 0.0  w/ Resp Event 0 0.0  Spontaneous 0 0.0  Total 0 0.0     Titration Table:  [facility] CPAP[facility] Report    General Information  Name: MeltChizara, Mena: 32 P24sician: SaimStar Age  ID: [id number]: [id number]: RandGaylyn Cheersx: Female Weight: 201 lb Record: [id number] 57m: 50 [1112August 09, 1972e: 06/04/2022 Scorer: MatthGaylyn Cheerscommended Settings IPAP: N/A cmH20 EPAP: N/A cmH2O AHI: N/A AHI (4%): N/A   Pressure IPAP/EPAP 00 05 06 07   O2 Vol 0.0  0.0 0.0 0.0  Time TRT 278.3129m68.577729m1.7729m5129m.129m 829mST 126.7729m 0653m 8.829m35.764m Sle27mStage % Wake 54.7 100.0 86.9 49.6   % REM 0.0 0.0 0.0 31.4   % N1 18.3 0.0 87.5 28.6   % N2 78.6 0.0 12.5 38.6   % N3 3.2 0.0 0.0 0.0  Respiratory Total Events 84 0 1 2   Obs. Apn. 7 0 0 0   Mixed Apn. 0 0 0 0   Cen. Apn. 0 0 0 0   Hypopneas 77 0 1 2   AHI 40.00 0.00 7.50 3.43   Supine AHI 48.75 0.00 0.00 0.00   Prone AHI 0.00 0.00 0.00 0.00   Side AHI 38.73 0.00 8.57 3.87  Respiratory (4%) Hypopneas (4%) 63.00 0.00 1.00 2.00   AHI (4%) 33.33 0.00 7.50 3.43   Supine AHI (4%) 45.00 0.00 0.00 0.00   Prone AHI (4%) 0.00 0.00 0.00 0.00   Side AHI (4%)  31.64 0.00 8.57 3.87  Desat Profile <= 90% 22.729m 5.27m 17729mm 147729m   37m80%553m18m 4.27m 0.64m107m64m 227m= 72729m5.23129m.53m 0.29m 0.1653m <=964m% 53129m 4.29m0.7729m 0.29m  [id number] 0.0 0.0 0.0   Hypopnea 13.3 0.0 0.0 0.0   LM 0.0 0.0 0.0 0.0   Spontaneous 25.2 0.0 67.5 36.0

## 2022-06-13 NOTE — Addendum Note (Signed)
Addended by: Star Age on: 06/13/2022 01:03 PM   Modules accepted: Orders

## 2022-06-16 ENCOUNTER — Telehealth: Payer: Self-pay

## 2022-06-16 ENCOUNTER — Encounter: Payer: Self-pay | Admitting: Neurology

## 2022-06-16 ENCOUNTER — Ambulatory Visit (INDEPENDENT_AMBULATORY_CARE_PROVIDER_SITE_OTHER): Payer: No Typology Code available for payment source | Admitting: Neurology

## 2022-06-16 VITALS — BP 138/80 | HR 89 | Ht 64.0 in | Wt 197.0 lb

## 2022-06-16 DIAGNOSIS — G4733 Obstructive sleep apnea (adult) (pediatric): Secondary | ICD-10-CM | POA: Diagnosis not present

## 2022-06-16 NOTE — Progress Notes (Signed)
Order for new cpap sent to [facility] via community message. Confirmation received that the order transmitted was successful.

## 2022-06-16 NOTE — Telephone Encounter (Signed)
I called patient.  I discussed her sleep study results and recommendations.  Patient has many questions about her sleep study results and the data.  She is reluctant to start a CPAP.  She would like to know if her events were hypopneic or apneic in nature.  She would like an appointment with Dr. Rexene Alberts to discuss this further before agreeing to start CPAP.  I offered her an appointment today at 11:15 AM with Dr. Rexene Alberts.  Patient accepted this appointment.

## 2022-06-16 NOTE — Progress Notes (Signed)
Subjective:    Patient ID: Tanya Carr is a 51 y.o. female.  HPI    Interim history:   Tanya Carr is a 51 year old right-handed woman with an underlying medical history of hypertension, degenerative lumbar disc disease, vitamin D deficiency, anxiety, sinus bradycardia, and obesity, who presents for follow-up consultation of her obstructive sleep apnea, which was deemed in the severe range by split-night sleep testing recently.  The patient is unaccompanied today and presents to discuss her sleep study results.  I first met her at the request of her primary care physician on 04/28/2022, at which time she reported snoring which was disturbing her partner.  She was advised to proceed with a sleep study.  She had a split-night sleep study on 06/04/2022.  This was an emergency split as criteria were met due to severe sleep disordered breathing.  Her baseline AHI was 40/h, O2 nadir 76%, she had absence of REM sleep.  She had poor sleep consolidation, delay in sleep onset, and difficulty maintaining sleep.  She did respond favorably to CPAP therapy but did have trouble maintaining sleep, she had trouble going back to sleep after starting CPAP therapy, titration was fairly successful with CPAP of 7 cm with significant reduction in her sleep disordered breathing.  She was shown different interfaces, chose the small P10 nasal pillows interface.  Today, 06/16/2022: She reports being claustrophobic, she has quite a bit of concern and apprehension about starting CPAP therapy at home.  She reports that her father had sleep apnea and she had found him when he died and he still had his CPAP mask on.  She cannot get that image out of her head she says.  She has tried a Pharmacist, community made mouth appliance and is wondering if she should have a home sleep test with a mouth appliance first to see if it is effective.  However, she indicated in her first visit that the appliance was not effective to reduce her snoring enough.   She is wondering if she should get an opinion from ENT but she is advised that surgical intervention is not considered first or second line and usually of last resort.  We talked about her sleep study results, we talked about her difficulty maintaining sleep and sleep disruption and therefore limited sleep study results but what we have indicated severe sleep disordered breathing and treatment records indicate significant improvement of her sleep disordered breathing with CPAP therapy.  The patient's allergies, current medications, family history, past medical history, past social history, past surgical history and problem list were reviewed and updated as appropriate.   Previously:   04/28/22: (She) reports snoring and some daytime tiredness.  There is snoring is disturbing to her husband and loud enough for him to sleep in a different bedroom.  I reviewed your office note from 02/25/2022.  Her Epworth sleepiness score is 3 out of 24, fatigue severity score is 22 out of 63.  She lives with her significant other of 20 years, she has had loud enough snoring to disturb his sleep.  She has tried a dental device through her dentist but has not used it recently.  She found the second level too uncomfortable and not effective.  She had braces last year.  Bedtime is generally around 9 PM but she does wake up early as her partner wakes up around 2 AM and then she goes back to bed, eventually falls back asleep around 4 or 4:30 AM and then gets up at 7:30 AM.  She does have a TV in her bedroom on at night but turns it off before falling asleep.  She has nocturia about twice per average night.  She does not have any recurrent morning headaches.  She has had some weight fluctuation, lost up to 40 pounds but does still snore when she weighs less.  She has had weight gain again.  She has a family history of sleep apnea, her father had CPAP therapy and he was also on treatment for COPD as understand.  She has no pets in  household, they have no kids.  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  She drinks caffeine in the form of coffee, about 2 cups in the morning, alcohol occasionally, quit smoking several years ago.  She has also tried over-the-counter devices for snoring, none of which helped.  Her Past Medical History Is Significant For: Past Medical History:  Diagnosis Date   Allergy    seasonal   Fainting episodes 2011   history of with blood draw, mammogram, post op 2011   Headache(784.0)    history of migraines   Hypertension    on lisinopril-will do ekg today   Palpitations    PVC's   PONV (postoperative nausea and vomiting)    vagal response per pt    Her Past Surgical History Is Significant For: Past Surgical History:  Procedure Laterality Date   ABDOMINAL HYSTERECTOMY     LUMBAR DISC SURGERY     ulnar nerve decompression  2011    Her Family History Is Significant For: Family History  Problem Relation Age of Onset   Cancer Mother    Multiple sclerosis Mother    COPD Father    Pulmonary embolism Brother    Colon cancer Neg Hx    Colon polyps Neg Hx    Esophageal cancer Neg Hx    Rectal cancer Neg Hx    Stomach cancer Neg Hx     Her Social History Is Significant For: Social History   Socioeconomic History   Marital status: Single    Spouse name: Not on file   Number of children: Not on file   Years of education: Not on file   Highest education level: Not on file  Occupational History   Not on file  Tobacco Use   Smoking status: Former    Types: Cigarettes    Quit date: 11/16/1996    Years since quitting: 25.5   Smokeless tobacco: Never  Vaping Use   Vaping Use: Never used  Substance and Sexual Activity   Alcohol use: Yes    Comment: rare   Drug use: No   Sexual activity: Yes  Other Topics Concern   Not on file  Social History Narrative   G62, lives at home with significant other.    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    Caffeine: 1-2 cups daily.    Social Determinants of Health   Financial Resource Strain: Not on file  Food Insecurity: Not on file  Transportation Needs: Not on file  Physical Activity: Not on file  Stress: Not on file  Social Connections: Not on file    Her Allergies Are:  Allergies  Allergen Reactions   Bee Pollen    Food Hives    Seafood allergy: hives and itching   Shellfish Allergy    Vit D-Vit E-Safflower Oil    Vitamin D Analogs   :   Her Current Medications Are:  Outpatient Encounter Medications as  of 06/16/2022  Medication Sig   amLODipine (NORVASC) 10 MG tablet Take 1 tablet (10 mg total) by mouth daily.   cyclobenzaprine (FLEXERIL) 10 MG tablet TAKE 1 TABLET BY MOUTH AT BEDTIME AS NEEDED FOR MUSCLE SPASMS.   ibuprofen (ADVIL) 400 MG tablet Take 1.5 tablets (600 mg total) by mouth every 8 (eight) hours as needed.   lisinopril (ZESTRIL) 30 MG tablet Take 1 tablet (30 mg total) by mouth daily.   omeprazole (PRILOSEC) 20 MG capsule Take 1 capsule (20 mg total) by mouth daily.   No facility-administered encounter medications on file as of 06/16/2022.  :  Review of Systems:  Out of a complete 14 point review of systems, all are reviewed and negative with the exception of these symptoms as listed below:   Review of Systems  Neurological:        Patient is here alone for sleep study follow-up. She states would like to discuss the results with Dr Rexene Alberts.    Objective:  Neurological Exam  Physical Exam Physical Examination:   Vitals:   06/16/22 1102  BP: 138/80  Pulse: 89    General Examination: The patient is a very pleasant 51 y.o. female in no acute distress. She appears well-developed and well-nourished and well groomed.  She is tearful at times, appears anxious.  HEENT: Normocephalic, atraumatic, pupils are equal, round and reactive to light, extraocular tracking well-preserved, corrective eyeglasses in place.  Hearing grossly intact.  Face is symmetric with normal facial  animation.  Airway examination reveals moderate mouth dryness, otherwise stable findings with moderate airway crowding.  No carotid bruits.  Chest: Clear to auscultation without wheezing, rhonchi or crackles noted.   Heart: S1+S2+0, regular and normal without murmurs, rubs or gallops noted.    Abdomen: Soft, non-tender and non-distended.   Extremities: There is no obvious edema in the distal lower extremities bilaterally.    Skin: Warm and dry without trophic changes noted.    Musculoskeletal: exam reveals no obvious joint deformities.    Neurologically:  Mental status: The patient is awake, alert and oriented in all 4 spheres. Her immediate and remote memory, attention, language skills and fund of knowledge are appropriate. There is no evidence of aphasia, agnosia, apraxia or anomia. Speech is clear with normal prosody and enunciation. Thought process is linear. Mood is normal and affect is normal, anxious and tearful at times.  Cranial nerves II - XII are as described above under HEENT exam.  Motor exam: Normal bulk, strength and tone is noted. There is no obvious tremor. Fine motor skills and coordination: grossly intact.  Cerebellar testing: No dysmetria or intention tremor. There is no truncal or gait ataxia.  Sensory exam: intact to light touch in the upper and lower extremities.  Gait, station and balance: She stands easily. No veering to one side is noted. No leaning to one side is noted. Posture is age-appropriate and stance is narrow based. Gait shows normal stride length and normal pace. No problems turning are noted.    Assessment and Plan:  In summary, ABILENE MCPHEE is a very pleasant 51 year old right-handed woman with an underlying medical history of hypertension, degenerative lumbar disc disease, vitamin D deficiency, anxiety, sinus bradycardia, and obesity, who presents for follow-up consultation of her obstructive sleep apnea, which was deemed in the severe range by  split-night sleep testing recently.  We had reached out to her to set up CPAP therapy at home after the study, but she requested a follow-up to discuss  findings.  I talked her about her sleep test results and length today.  I do agree that she did not sleep well that night, she does endorse not sleeping through the night at home either. She debated different options and wanted to consider pursuing a home sleep test with her oral appliance in place first; I suggested we could certainly run this through her insurance for authorization. She wanted to consider an ENT evaluation and I did offer her a referral to ENT to discuss surgical treatment options for sleep apnea but advised her that severe sleep apnea is best treated with positive airway pressure and any surgical treatment is never first-line.  She is encouraged to continue to work on weight loss.  She did decide at the end of our visit to pursue CPAP therapy.  She was advised that limited test results did show that she has severe sleep disordered breathing and responded favorably on PAP therapy.  Whether or not we found the optimal pressure and the optimal mask will be something we have to reassess over time.  She is advised to make an appointment for follow-up for her compliance appointment and symptom recheck in about 3 months from now.  We will go ahead and send the CPAP order to a local DME company.  I answered all questions today and she is in agreement.    I spent 30 minutes in total face-to-face time and in reviewing records during pre-charting, more than 50% of which was spent in counseling and coordination of care, reviewing test results, reviewing medications and treatment regimen and/or in discussing or reviewing the diagnosis of OSA, the prognosis and treatment options. Pertinent laboratory and imaging test results that were available during this visit with the patient were reviewed by me and considered in my medical decision making (see chart for  details).

## 2022-06-16 NOTE — Telephone Encounter (Signed)
-----   Message from Star Age, MD sent at 06/13/2022  1:03 PM EDT ----- Urgent set up requested on PAP therapy, due to severe OSA.  Patient referred by Dr. Gena Fray, seen by me on 04/28/22, patient had a split night sleep study on 06/04/22. Please call and notify patient that the recent sleep study showed severe obstructive sleep apnea (OSA). She did well with CPAP during the study with significant improvement of the respiratory events. I would like start the patient on a new CPAP machine for home use. I placed the order in the chart.  Please advise patient that we will need a follow up appointment with either myself or one of our nurse practitioners in about 2-3 months post set-up to check for how they are doing on treatment and how well it's going with the machine in general. Most insurance company require a certain compliance percentage to continue to cover/pay for the machine. Please ask patient to schedule this FU appointment, according to the set-up date, which is the day they receive the machine. Please make sure, the patient understands the importance of keeping this window for the FU appointment, as it is often an Designer, industrial/product and not our rule. Failing to adhere to this may result in losing coverage for sleep apnea treatment, at which point some insurances require repeating the whole process. Plus, monitoring compliance data is usually good feedback for the patient as far as how they are doing, how many hours they are on it, how well the mask fits, etc.  Also remind patient, that any PAP machine or mask issues should be first addressed with the DME company, who provided the machine/supplies.  Please ask if patient has a preference regarding DME company, may depend on the insurance too.  Star Age, MD, PhD [facility])

## 2022-06-16 NOTE — Patient Instructions (Signed)
I would like for you to start your CPAP therapy at home. I have placed the order in the chart.  Your DME provider will go over your machine option and mask options with you.   You will need a follow up appointment in about 10 weeks post set up, that has to be scheduled.  Please use your CPAP regularly. While your insurance requires that you use CPAP at least 4 hours each night on 70% of the nights, I recommend, that you not skip any nights and use it throughout the night if you can, even for scheduled naps. Getting used to CPAP and staying with the treatment long term does take time and patience and discipline. Untreated obstructive sleep apnea when it is moderate to severe can have an adverse impact on cardiovascular health and raise her risk for heart disease, arrhythmias, hypertension, congestive heart failure, stroke and diabetes. Untreated obstructive sleep apnea causes sleep disruption, nonrestorative sleep, and sleep deprivation. This can have an impact on your day to day functioning and cause daytime sleepiness and impairment of cognitive function, memory loss, mood disturbance, and problems focussing. Using CPAP regularly can improve these symptoms.

## 2022-07-11 ENCOUNTER — Encounter: Payer: Self-pay | Admitting: Neurology

## 2022-07-14 NOTE — Telephone Encounter (Signed)
Cpap order emailed to [email or web address]

## 2022-08-05 ENCOUNTER — Telehealth: Payer: Self-pay | Admitting: Nurse Practitioner

## 2022-08-05 NOTE — Telephone Encounter (Signed)
Caller Name: Keali Mccraw Call back phone #: [phone]  Reason for Call: Pt is hoping to transfer care to Dr. Gena Fray. She had a recent acute visit with him and was happy with results. Is this okay to schedule? She is due for cpe in March 2024

## 2022-08-13 ENCOUNTER — Encounter: Payer: Self-pay | Admitting: Family Medicine

## 2022-08-13 ENCOUNTER — Ambulatory Visit (INDEPENDENT_AMBULATORY_CARE_PROVIDER_SITE_OTHER): Payer: PRIVATE HEALTH INSURANCE | Admitting: Family Medicine

## 2022-08-13 VITALS — BP 122/76 | HR 52 | Temp 97.7°F | Ht 64.0 in | Wt 197.6 lb

## 2022-08-13 DIAGNOSIS — G4733 Obstructive sleep apnea (adult) (pediatric): Secondary | ICD-10-CM | POA: Diagnosis not present

## 2022-08-13 DIAGNOSIS — I10 Essential (primary) hypertension: Secondary | ICD-10-CM | POA: Diagnosis not present

## 2022-08-13 DIAGNOSIS — K8020 Calculus of gallbladder without cholecystitis without obstruction: Secondary | ICD-10-CM | POA: Diagnosis not present

## 2022-08-13 NOTE — Progress Notes (Signed)
[facility] [address] [place] [address] Dept: [phone] Dept Fax: [phone]  Transfer of Care Office Visit  Subjective:    Patient ID: Tanya Carr, female    DOB: 12/23/1970, 51 y.o..   MRN: [id number]  Chief Complaint  Patient presents with   Establish Care    [facility]- establish care.   No concerns.   Fasting today.     History of Present Illness:  Patient is in today to establish care. Ms. Vaughan was born in [place], [place]. 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 She moved to [place] in 2006 (moved from [place]). She has been in a relationship with her SO for 23 years. She has no children. Ms. 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 She denies use of tobacco, alcohol, or drugs.  Ms. Hehl has a history of hypertension. She is managed on amlodipine 10 mg daily and lisinopril 30 mg daily. She notes this combination has proven very effective for keeping her BP under control and not worsening her bradycardia.  Ms. Kibby was recently diagnosed with OSA. She had sough evaluation due to loud snoring. She is currently using a CPAP. She finds that she is snoring less (her SO is pleased with this) and is getting up to urinate at night less frequently. She doesn't feel that she is necessarily better rested. She also shares that the CPAP triggers for her emotions around her father's death, where she found him deceased, but with a CPAP in place blowing air.  Ms. Howser has a history of having gallstones, but without active cholecystitis. This was found in Sept. Her liver enzymes have been normal. She does note occasional bouts of LUQ pain, but nothing sustained.   Past Medical History: Patient Active Problem List   Diagnosis Date Noted   Obstructive sleep apnea 08/13/2022   Calculus of gallbladder without cholecystitis without obstruction 06/04/2022    Tailor's bunion of left foot 02/27/2022   Sinus bradycardia 11/12/2020   Essential hypertension 10/20/2017   Anxiety 03/06/2017   DDD (degenerative disc disease), lumbosacral 09/24/2016   Vitamin D deficiency 07/11/2009   Past Surgical History:  Procedure Laterality Date   ABDOMINAL HYSTERECTOMY     LUMBAR DISC SURGERY     ulnar nerve decompression Bilateral 2011   Family History  Problem Relation Age of Onset   Cancer Mother        Lung   Multiple sclerosis Mother    COPD Father    Pulmonary embolism Brother    Heart disease Maternal Grandmother    Sarcoidosis Paternal Grandmother    Colon cancer Neg Hx    Colon polyps Neg Hx    Esophageal cancer Neg Hx    Rectal cancer Neg Hx    Stomach cancer Neg Hx    Outpatient Medications Prior to Visit  Medication Sig Dispense Refill   amLODipine (NORVASC) 10 MG tablet Take 1 tablet (10 mg total) by mouth daily. 90 tablet 3   cyclobenzaprine (FLEXERIL) 10 MG tablet TAKE 1 TABLET BY MOUTH AT BEDTIME AS NEEDED FOR MUSCLE SPASMS. 30 tablet 0   ibuprofen (ADVIL) 400 MG tablet Take 1.5 tablets (600 mg total) by mouth every 8 (eight) hours as needed.     lisinopril (ZESTRIL) 30 MG tablet Take 1 tablet (30 mg total) by mouth daily. 90 tablet 3   omeprazole (PRILOSEC) 20 MG capsule Take 1 capsule (20 mg total)  by mouth daily. 30 capsule 0   No facility-administered medications prior to visit.   Allergies  Allergen Reactions   Bee Pollen    Food Hives    Seafood allergy: hives and itching   Shellfish Allergy    Vit D-Vit E-Safflower Oil    Vitamin D Analogs     Objective:   Today's Vitals   08/13/22 0818  BP: 122/76  Pulse: (!) 52  Temp: 97.7 F (36.5 C)  TempSrc: Temporal  SpO2: 93%  Weight: 197 lb 9.6 oz (89.6 kg)  Height: '5\' 4"'$  (1.626 m)   Body mass index is 33.92 kg/m.   General: Well developed, well nourished. No acute distress. Psych: Alert and oriented. Normal mood and affect.  Health Maintenance Due  Topic  Date Due   DTaP/Tdap/Td (1 - Tdap) Never done     Assessment & Plan:   1. Essential hypertension Blood pressure is at goal. Continue amlodipine 10 mg daily and lisinopril 30 mg daily.  2. Obstructive sleep apnea There have been some mild improvements with CPAP use, but Ms. Allebach is conflicted about the benefits. She could consider follow-up with Dr. Rexene Alberts.  3. Calculus of gallbladder without cholecystitis without obstruction I reviewed the prior ultrasound and lab results form Sept. Ms. Albarracin could consider a consultation with a general surgeon to discuss elective cholecystectomy. I will readdress this with her at her next visit.  Return in about 3 months (around 11/14/2022) for Annual preventative care.   Haydee Salter, MD

## 2022-08-20 ENCOUNTER — Other Ambulatory Visit: Payer: Self-pay | Admitting: Family Medicine

## 2022-08-20 DIAGNOSIS — R928 Other abnormal and inconclusive findings on diagnostic imaging of breast: Secondary | ICD-10-CM

## 2022-09-17 ENCOUNTER — Ambulatory Visit: Payer: No Typology Code available for payment source | Admitting: Neurology

## 2022-09-29 ENCOUNTER — Ambulatory Visit: Payer: No Typology Code available for payment source | Admitting: Neurology

## 2022-09-29 ENCOUNTER — Encounter: Payer: Self-pay | Admitting: Neurology

## 2022-09-29 VITALS — BP 146/81 | HR 58 | Ht 64.0 in | Wt 200.4 lb

## 2022-09-29 DIAGNOSIS — Z789 Other specified health status: Secondary | ICD-10-CM

## 2022-09-29 DIAGNOSIS — G4733 Obstructive sleep apnea (adult) (pediatric): Secondary | ICD-10-CM

## 2022-09-29 NOTE — Progress Notes (Signed)
Subjective:    Patient ID: Tanya Carr is a 52 y.o. female.  HPI    Interim history:   [name] is a 52 year old right-handed woman with an underlying medical history of hypertension, degenerative lumbar disc disease, vitamin D deficiency, anxiety, sinus bradycardia, and obesity, who presents for follow-up consultation of her obstructive sleep apnea, after starting CPAP therapy. The patient is unaccompanied today. I last saw her on 06/16/2022, at which time she reported having apprehension about starting CPAP therapy at home, she had quite a bit of anxiety and apprehension about it.  We talked about potential alternative treatment options but she was encouraged to give CPAP a try first if possible.  She agreed to start home CPAP therapy, her set up date was 07/14/2022.  She has a ResMed air sense 11 AutoSet machine.  Her DME company is [employer].   Today, 09/29/2022: I reviewed her CPAP compliance data from 08/26/2022 through 09/24/2022, which is a total of 30 days, during which time she used her machine every night with percent use days greater than 4 hours at 100%, indicating superb compliance with an average usage of 9 hours and 52 minutes, residual AHI at goal at 1.3/h, leak on the low side with the 95th percentile at 0.2 L/min on a pressure of 7 cm with EPR of 3.  She reports not noticing much in the way of difference in her sleep, if anything, she feels that she sleeps with more interruptions.  She does still wake up for a couple of hours around 2 AM and then goes back to bed, altogether, she estimates that she sleeps perhaps 6 hours altogether per average night but with interruptions.  She uses a P10 medium nasal pillows interface from [employer]Med.  She is working on weight loss. Her Epworth sleepiness score is 1 out of 24.  The mask is uncomfortable, she is a side sleeper.  She wants to look into getting a travel CPAP but would like to rented rather than buying it.  She brought her current CPAP  machine out right from [place].  The patient's allergies, current medications, family history, past medical history, past social history, past surgical history and problem list were reviewed and updated as appropriate.    Previously:   I first met her at the request of her primary care physician on 04/28/2022, at which time she reported snoring which was disturbing her partner.  She was advised to proceed with a sleep study.  She had a split-night sleep study on 06/04/2022.  This was an emergency split as criteria were met due to severe sleep disordered breathing.  Her baseline AHI was 40/h, O2 nadir 76%, she had absence of REM sleep.  She had poor sleep consolidation, delay in sleep onset, and difficulty maintaining sleep.  She did respond favorably to CPAP therapy but did have trouble maintaining sleep, she had trouble going back to sleep after starting CPAP therapy, titration was fairly successful with CPAP of 7 cm with significant reduction in her sleep disordered breathing.  She was shown different interfaces, chose the small P10 nasal pillows interface.     04/28/22: (She) reports snoring and some daytime tiredness.  Her snoring is disturbing to her SO (corrected from "husband" on 09/29/2022) and loud enough for him to sleep in a different bedroom.  I reviewed your office note from 02/25/2022.  Her Epworth sleepiness score is 3 out of 24, fatigue severity score is 22 out of 63.  She lives with her  significant other of 20 years, she has had loud enough snoring to disturb his sleep.  She has tried a dental device through her dentist but has not used it recently.  She found the second level too uncomfortable and not effective.  She had braces last year.  Bedtime is generally around 9 PM but she does wake up early as her partner wakes up around 2 AM and then she goes back to bed, eventually falls back asleep around 4 or 4:30 AM and then gets up at 7:30 AM.  She does have a TV in her bedroom on at night but  turns it off before falling asleep.  She has nocturia about twice per average night.  She does not have any recurrent morning headaches.  She has had some weight fluctuation, lost up to 40 pounds but does still snore when she weighs less.  She has had weight gain again.  She has a family history of sleep apnea, her father had CPAP therapy and he was also on treatment for COPD as understand.  She has no pets in household, they have no kids.  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  She drinks caffeine in the form of coffee, about 2 cups in the morning, alcohol occasionally, quit smoking several years ago.  She has also tried over-the-counter devices for snoring, none of which helped.   Her Past Medical History Is Significant For: Past Medical History:  Diagnosis Date   Allergy    seasonal   Fainting episodes 2011   history of with blood draw, mammogram, post op 2011   Headache(784.0)    history of migraines   Hypertension    on lisinopril-will do ekg today   Palpitations    PVC's   PONV (postoperative nausea and vomiting)    vagal response per pt    Her Past Surgical History Is Significant For: Past Surgical History:  Procedure Laterality Date   ABDOMINAL HYSTERECTOMY     LUMBAR DISC SURGERY     ulnar nerve decompression Bilateral 2011    Her Family History Is Significant For: Family History  Problem Relation Age of Onset   Cancer Mother        Lung   Multiple sclerosis Mother    COPD Father    Pulmonary embolism Brother    Heart disease Maternal Grandmother    Sarcoidosis Paternal Grandmother    Colon cancer Neg Hx    Colon polyps Neg Hx    Esophageal cancer Neg Hx    Rectal cancer Neg Hx    Stomach cancer Neg Hx     Her Social History Is Significant For: Social History   Socioeconomic History   Marital status: Significant Other    Spouse name: Not on file   Number of children: 0   Years of education: Not on file   Highest education level: Not on file   Occupational History   Occupation: Government social research officer for [employer]    Comment: [employer]  Tobacco Use   Smoking status: Former    Types: Cigarettes    Quit date: 11/16/1996    Years since quitting: 25.8   Smokeless tobacco: Never  Vaping Use   Vaping Use: Never used  Substance and Sexual Activity   Alcohol use: Yes    Comment: rare   Drug use: No   Sexual activity: Yes  Other Topics Concern   Not on file  Social History Narrative   Not on file   Social  Determinants of Health   Financial Resource Strain: Not on file  Food Insecurity: Not on file  Transportation Needs: Not on file  Physical Activity: Not on file  Stress: Not on file  Social Connections: Not on file    Her Allergies Are:  Allergies  Allergen Reactions   Bee Pollen    Food Hives    Seafood allergy: hives and itching   Shellfish Allergy    Vit D-Vit E-Safflower Oil    Vitamin D Analogs   :   Her Current Medications Are:  Outpatient Encounter Medications as of 09/29/2022  Medication Sig   amLODipine (NORVASC) 10 MG tablet Take 1 tablet (10 mg total) by mouth daily.   cyclobenzaprine (FLEXERIL) 10 MG tablet TAKE 1 TABLET BY MOUTH AT BEDTIME AS NEEDED FOR MUSCLE SPASMS.   ibuprofen (ADVIL) 400 MG tablet Take 1.5 tablets (600 mg total) by mouth every 8 (eight) hours as needed.   lisinopril (ZESTRIL) 30 MG tablet Take 1 tablet (30 mg total) by mouth daily.   omeprazole (PRILOSEC) 20 MG capsule Take 1 capsule (20 mg total) by mouth daily.   No facility-administered encounter medications on file as of 09/29/2022.  :  Review of Systems:  Out of a complete 14 point review of systems, all are reviewed and negative with the exception of these symptoms as listed below:   Review of Systems  Neurological:        CPAP follow up.  ESS 1.      Objective:  Neurological Exam  Physical Exam Physical Examination:   Vitals:   09/29/22 0748  BP: (!) 146/81  Pulse: (!) 58    General Examination: The  patient is a very pleasant 52 y.o. female in no acute distress. She appears well-developed and well-nourished and well groomed.   HEENT: Normocephalic, atraumatic, pupils are equal, round and reactive to light, extraocular tracking well-preserved, corrective eyeglasses in place.  Hearing grossly intact.  Face is symmetric with normal facial animation.  Airway examination reveals mild mouth dryness, otherwise stable findings with moderate airway crowding.  No carotid bruits.   Chest: Clear to auscultation without wheezing, rhonchi or crackles noted.   Heart: S1+S2+0, regular and normal without murmurs, rubs or gallops noted.    Abdomen: Soft, non-tender and non-distended.   Extremities: There is no obvious edema in the distal lower extremities bilaterally.    Skin: Warm and dry without trophic changes noted.    Musculoskeletal: exam reveals no obvious joint deformities.    Neurologically:  Mental status: The patient is awake, alert and oriented in all 4 spheres. Her immediate and remote memory, attention, language skills and fund of knowledge are appropriate. There is no evidence of aphasia, agnosia, apraxia or anomia. Speech is clear with normal prosody and enunciation. Thought process is linear. Mood is normal and affect is normal, anxious and tearful at times.  Cranial nerves II - XII are as described above under HEENT exam.  Motor exam: Normal bulk, strength and tone is noted. There is no obvious tremor. Fine motor skills and coordination: grossly intact.  Cerebellar testing: No dysmetria or intention tremor. There is no truncal or gait ataxia.  Sensory exam: intact to light touch in the upper and lower extremities.  Gait, station and balance: She stands easily. No veering to one side is noted. No leaning to one side is noted. Posture is age-appropriate and stance is narrow based. Gait shows normal stride length and normal pace. No problems turning are noted.  Assessment and Plan:  In  summary, ROSALEEN MAZER is a very pleasant 52 year old right-handed woman with an underlying medical history of hypertension, degenerative lumbar disc disease, vitamin D deficiency, anxiety, sinus bradycardia, and obesity, who presents for follow-up consultation of her obstructive sleep apnea, after starting CPAP therapy on 07/14/2022.  She is fully compliant with treatment and is highly commended for it especially in light of her anxiety and apprehension before starting CPAP therapy. She had a split-night sleep study on 06/04/2022 which showed severe sleep disordered breathing.  Her baseline AHI was 40/h, O2 nadir 76%, she had absence of REM sleep.  She admits that she does not like to use her CPAP, she is still struggling with tolerance, she uses a P10 interface from [employer].  She is encouraged to continue with full compliance, she is advised to continue to work on weight loss.  If she would like to discuss inspire we can certainly pick up our discussion again in the future.  For now, she is advised to follow-up routinely in our sleep clinic to see one of our nurse practitioners in 1 year.We can make a sooner appointment if the need arises.  I answered all her questions today and she was in agreement. I spent 30 minutes in total face-to-face time and in reviewing records during pre-charting, more than 50% of which was spent in counseling and coordination of care, reviewing test results, reviewing medications and treatment regimen and/or in discussing or reviewing the diagnosis of OSA, the prognosis and treatment options. Pertinent laboratory and imaging test results that were available during this visit with the patient were reviewed by me and considered in my medical decision making (see chart for details).

## 2022-09-29 NOTE — Patient Instructions (Addendum)
It was nice to see you again today. I am glad to see that your compliance is great and your apnea scores are low.  Nevertheless, it is an adjustment and you are still struggling with your CPAP machine.  There are other mask options which may be more comfortable for you, 1 style is under the nose and is called nasal cushion, ResMed makes 1 and other brands as well.  Please talk to your DME provider about getting replacement supplies on a regular basis. Please be sure to change your filter every month, your mask about every 3 months (nasal pillow inserts monthly), hose about every 6 months, humidifier chamber about yearly. Some restrictions are imposed by insurance, if your supplies are filed through insurance.   You can probably talk to your DME company also about a travel CPAP machine, ResMed air mini is a version, I would be happy to provide you with a prescription for a travel CPAP.  Your DME company can provide further details if necessary.    Please continue using your CPAP regularly. While your insurance requires that you use CPAP at least 4 hours each night on 70% of the nights, I recommend, that you not skip any nights and use it throughout the night if you can. Getting used to CPAP and staying with the treatment long term does take time and patience and discipline. Untreated obstructive sleep apnea when it is moderate to severe can have an adverse impact on cardiovascular health and raise her risk for heart disease, arrhythmias, hypertension, congestive heart failure, stroke and diabetes. Untreated obstructive sleep apnea causes sleep disruption, nonrestorative sleep, and sleep deprivation. This can have an impact on your day to day functioning and cause daytime sleepiness and impairment of cognitive function, memory loss, mood disturbance, and problems focussing. Using CPAP regularly can improve these symptoms.  We can see you in 1 year, you can see one of our nurse practitioners as you are stable.   If you need a sooner appointment, do not hesitate to call our office or email [place] through [facility].

## 2022-10-03 ENCOUNTER — Encounter: Payer: Self-pay | Admitting: Neurology

## 2022-11-14 ENCOUNTER — Encounter: Payer: Self-pay | Admitting: Family Medicine

## 2022-11-14 ENCOUNTER — Other Ambulatory Visit: Payer: Self-pay

## 2022-11-14 ENCOUNTER — Ambulatory Visit (INDEPENDENT_AMBULATORY_CARE_PROVIDER_SITE_OTHER): Payer: PRIVATE HEALTH INSURANCE | Admitting: Family Medicine

## 2022-11-14 VITALS — BP 130/78 | HR 56 | Temp 98.3°F | Ht 64.0 in | Wt 201.8 lb

## 2022-11-14 DIAGNOSIS — E6609 Other obesity due to excess calories: Secondary | ICD-10-CM | POA: Insufficient documentation

## 2022-11-14 DIAGNOSIS — N951 Menopausal and female climacteric states: Secondary | ICD-10-CM

## 2022-11-14 DIAGNOSIS — K8020 Calculus of gallbladder without cholecystitis without obstruction: Secondary | ICD-10-CM

## 2022-11-14 DIAGNOSIS — Z0000 Encounter for general adult medical examination without abnormal findings: Secondary | ICD-10-CM

## 2022-11-14 DIAGNOSIS — Z23 Encounter for immunization: Secondary | ICD-10-CM | POA: Diagnosis not present

## 2022-11-14 DIAGNOSIS — G4733 Obstructive sleep apnea (adult) (pediatric): Secondary | ICD-10-CM | POA: Diagnosis not present

## 2022-11-14 DIAGNOSIS — R350 Frequency of micturition: Secondary | ICD-10-CM

## 2022-11-14 DIAGNOSIS — I10 Essential (primary) hypertension: Secondary | ICD-10-CM | POA: Diagnosis not present

## 2022-11-14 LAB — URINALYSIS, ROUTINE W REFLEX MICROSCOPIC
Bilirubin Urine: NEGATIVE
Hgb urine dipstick: NEGATIVE
Ketones, ur: NEGATIVE
Leukocytes,Ua: NEGATIVE
Nitrite: NEGATIVE
Specific Gravity, Urine: <=1.005 — AB (ref 1.000–1.030)
Total Protein, Urine: NEGATIVE
Urine Glucose: NEGATIVE
Urobilinogen, UA: 0.2 (ref 0.0–1.0)
pH: 6.5 (ref 5.0–8.0)

## 2022-11-14 LAB — COMPREHENSIVE METABOLIC PANEL
ALT: 16 U/L (ref 0–35)
AST: 16 U/L (ref 0–37)
Albumin: 4.1 g/dL (ref 3.5–5.2)
Alkaline Phosphatase: 62 U/L (ref 39–117)
BUN: 17 mg/dL (ref 6–23)
CO2: 30 mEq/L (ref 19–32)
Calcium: 9.5 mg/dL (ref 8.4–10.5)
Chloride: 103 mEq/L (ref 96–112)
Creatinine, Ser: 0.91 mg/dL (ref 0.40–1.20)
GFR: 72.07 mL/min (ref >60.00)
Glucose, Bld: 89 mg/dL (ref 70–99)
Potassium: 4 mEq/L (ref 3.5–5.1)
Sodium: 141 mEq/L (ref 135–145)
Total Bilirubin: 0.4 mg/dL (ref 0.2–1.2)
Total Protein: 7.2 g/dL (ref 6.0–8.3)

## 2022-11-14 NOTE — Progress Notes (Signed)
[facility] [name] [place] [address] Dept: [phone] Dept Fax: [phone]  Annual Physical Visit  Subjective:    Patient ID: Tanya Carr, female    DOB: 1970-12-26, 52 y.o..   MRN: [id number]  Chief Complaint  Patient presents with   Annual Exam    CPE/labs.   Fasting today.    History of Present Illness:  Patient is in today for an annual physical/preventative visit.  Tanya Carr has a history of hypertension. She is managed on amlodipine 10 mg daily and lisinopril 30 mg daily.   Tanya Carr has OSA. She is currently using a CPAP. She doesn't feel that she is necessarily better rested. Her sleep specialist is pleased with her compliance, but she is not sure she will keep using this if her symptoms don't improve.   Tanya Carr has a history of having gallstones, but without active cholecystitis. She does note occasional bouts of LUQ pain, but nothing sustained. She did have a recent flare that last about a day.  Review of Systems  Constitutional:  Negative for chills, diaphoresis, fever, malaise/fatigue and weight loss.  HENT:  Negative for congestion, ear pain, hearing loss, sinus pain, sore throat and tinnitus.   Eyes:  Negative for blurred vision, pain, discharge and redness.  Respiratory:  Negative for cough, shortness of breath and wheezing.   Cardiovascular:  Positive for palpitations. Negative for chest pain.       Has a history of bradycardia. She does note occasional "PVCs" that usually subside with deep breathing.  Gastrointestinal:  Positive for abdominal pain. Negative for constipation, diarrhea, heartburn, nausea and vomiting.       Occasional bouts of gallbladder pain. See above.  Genitourinary:  Positive for frequency.       Had a recent issue with urinary frequency. She took two doses of amoxicillin she got from a friend and then took Azo. Her symptoms resolved.  Musculoskeletal:  Negative  for back pain, joint pain and myalgias.  Skin:  Negative for itching and rash.  Psychiatric/Behavioral:  Negative for depression. The patient is not nervous/anxious.    Past Medical History: Patient Active Problem List   Diagnosis Date Noted   Obstructive sleep apnea 08/13/2022   Calculus of gallbladder without cholecystitis without obstruction 06/04/2022   Tailor's bunion of left foot 02/27/2022   Sinus bradycardia 11/12/2020   Essential hypertension 10/20/2017   Anxiety 03/06/2017   DDD (degenerative disc disease), lumbosacral 09/24/2016   Past Surgical History:  Procedure Laterality Date   ABDOMINAL HYSTERECTOMY     LUMBAR DISC SURGERY     ulnar nerve decompression Bilateral 2011   Family History  Problem Relation Age of Onset   Cancer Mother        Lung   Multiple sclerosis Mother    COPD Father    Pulmonary embolism Brother    Heart disease Maternal Grandmother    Sarcoidosis Paternal Grandmother    Colon cancer Neg Hx    Colon polyps Neg Hx    Esophageal cancer Neg Hx    Rectal cancer Neg Hx    Stomach cancer Neg Hx    Outpatient Medications Prior to Visit  Medication Sig Dispense Refill   amLODipine (NORVASC) 10 MG tablet Take 1 tablet (10 mg total) by mouth daily. 90 tablet 3   cyclobenzaprine (FLEXERIL) 10 MG tablet TAKE 1 TABLET BY MOUTH AT BEDTIME AS NEEDED FOR MUSCLE SPASMS. 30 tablet 0   ibuprofen (ADVIL)  400 MG tablet Take 1.5 tablets (600 mg total) by mouth every 8 (eight) hours as needed.     lisinopril (ZESTRIL) 30 MG tablet Take 1 tablet (30 mg total) by mouth daily. 90 tablet 3   omeprazole (PRILOSEC) 20 MG capsule Take 1 capsule (20 mg total) by mouth daily. 30 capsule 0   No facility-administered medications prior to visit.   Allergies  Allergen Reactions   Bee Pollen    Food Hives    Seafood allergy: hives and itching   Shellfish Allergy    Vit D-Vit E-Safflower Oil    Vitamin D Analogs    Objective:   Today's Vitals   11/14/22 0808   BP: 130/78  Pulse: (!) 56  Temp: 98.3 F (36.8 C)  TempSrc: Temporal  SpO2: 96%  Weight: 201 lb 12.8 oz (91.5 kg)  Height: 5\' 4"  (1.626 m)   Body mass index is 34.64 kg/m.   General: Well developed, well nourished. No acute distress. HEENT: Normocephalic, non-traumatic. PERRL, EOMI. Conjunctiva clear. External ears normal. EAC and   TMs normal bilaterally. Nose clear without congestion or rhinorrhea. Mucous membranes moist.   Oropharynx clear. Good dentition. Neck: Supple. No lymphadenopathy. No thyromegaly. Lungs: Clear to auscultation bilaterally. No wheezing, rales or rhonchi. CV: RRR without murmurs or rubs. Pulses 2+ bilaterally. Abdomen: Soft, non-tender. Bowel sounds positive, normal pitch and frequency. No hepatosplenomegaly.   No rebound or guarding. Back: Straight. No CVA tenderness bilaterally. Extremities: Full ROM. No joint swelling or tenderness. No edema noted. Skin: Warm and dry. No rashes. Psych: Alert and oriented. Normal mood and affect.  Health Maintenance Due  Topic Date Due   Hepatitis C Screening  Never done   DTaP/Tdap/Td (1 - Tdap) Never done   Zoster Vaccines- Shingrix (1 of 2) Never done     Assessment & Plan:   Problem List Items Addressed This Visit       Cardiovascular and Mediastinum   Essential hypertension    Blood pressure is in good control. Continue amlodipine 10 mg daily and lisinopril 30 mg daily.      Relevant Orders   Comprehensive metabolic panel     Respiratory   Obstructive sleep apnea    It is disappointing that Tanya Carr is not noticing improved symptoms with her use of her CPAP. It is understandable that she may decide to discontinue using this.        Digestive   Calculus of gallbladder without cholecystitis without obstruction    Occasional gallbladder attack. I will reassess her LFTs today.      Relevant Orders   Comprehensive metabolic panel   Other Visit Diagnoses     Annual physical exam    -  Primary    Overall excellent health. Encourage regular exercise and healthy diet to promote weight loss.   Urinary frequency       There may have been a recent UTI. I will check a UA today.   Relevant Orders   Urinalysis, Routine w reflex microscopic   Menopausal symptoms       Tanya Carr feels she may be in menopause. She ahd a prior hysterectomy so cannot use lack of menses as a measure of this. I will check her FSH/LH.   Relevant Orders   FSH/LH   Need for shingles vaccine       Relevant Orders   Varicella-zoster vaccine IM (Completed)   Need for Tdap vaccination       Relevant Orders  Tdap vaccine greater than or equal to 7yo IM (Completed)       Return in about 3 months (around 02/14/2023) for Reassessment.   Haydee Salter, MD

## 2022-11-14 NOTE — Assessment & Plan Note (Signed)
It is disappointing that Tanya Carr is not noticing improved symptoms with her use of her CPAP. It is understandable that she may decide to discontinue using this.

## 2022-11-14 NOTE — Assessment & Plan Note (Signed)
Occasional gallbladder attack. I will reassess her LFTs today.

## 2022-11-14 NOTE — Assessment & Plan Note (Signed)
Blood pressure is in good control. Continue amlodipine 10 mg daily and lisinopril 30 mg daily.

## 2022-11-15 LAB — FSH/LH
FSH: 76 m[IU]/mL
LH: 40.4 m[IU]/mL

## 2022-11-17 ENCOUNTER — Encounter: Payer: Self-pay | Admitting: Family Medicine

## 2022-11-19 ENCOUNTER — Encounter: Payer: No Typology Code available for payment source | Admitting: Family Medicine

## 2022-11-20 ENCOUNTER — Ambulatory Visit: Payer: PRIVATE HEALTH INSURANCE

## 2022-12-29 ENCOUNTER — Encounter: Payer: Self-pay | Admitting: Gastroenterology

## 2023-01-05 ENCOUNTER — Ambulatory Visit
Admission: RE | Admit: 2023-01-05 | Discharge: 2023-01-05 | Disposition: A | Discharge status: Home / Self Care | Payer: PRIVATE HEALTH INSURANCE | Source: Ambulatory Visit | Attending: Family Medicine | Admitting: Family Medicine

## 2023-01-05 DIAGNOSIS — R928 Other abnormal and inconclusive findings on diagnostic imaging of breast: Secondary | ICD-10-CM

## 2023-01-21 ENCOUNTER — Ambulatory Visit (INDEPENDENT_AMBULATORY_CARE_PROVIDER_SITE_OTHER): Payer: PRIVATE HEALTH INSURANCE

## 2023-01-21 DIAGNOSIS — Z23 Encounter for immunization: Secondary | ICD-10-CM | POA: Diagnosis not present

## 2023-01-21 NOTE — Progress Notes (Signed)
After obtaining consent, and per orders of Dr. Veto Kemps, injection of Shingrix given by [facility]. IM in the LEFT deltoid. Patient instructed to remain in clinic for 20 minutes afterwards, and to report any adverse reaction to me immediately.

## 2023-02-14 ENCOUNTER — Emergency Department (HOSPITAL_BASED_OUTPATIENT_CLINIC_OR_DEPARTMENT_OTHER): Payer: PRIVATE HEALTH INSURANCE

## 2023-02-14 ENCOUNTER — Encounter (HOSPITAL_BASED_OUTPATIENT_CLINIC_OR_DEPARTMENT_OTHER): Payer: Self-pay | Admitting: Emergency Medicine

## 2023-02-14 ENCOUNTER — Other Ambulatory Visit: Payer: Self-pay

## 2023-02-14 ENCOUNTER — Emergency Department (HOSPITAL_BASED_OUTPATIENT_CLINIC_OR_DEPARTMENT_OTHER)
Admission: EM | Admit: 2023-02-14 | Discharge: 2023-02-14 | Disposition: A | Discharge status: Home / Self Care | Payer: PRIVATE HEALTH INSURANCE | Attending: Emergency Medicine | Admitting: Emergency Medicine

## 2023-02-14 DIAGNOSIS — R1011 Right upper quadrant pain: Secondary | ICD-10-CM | POA: Diagnosis present

## 2023-02-14 DIAGNOSIS — K8050 Calculus of bile duct without cholangitis or cholecystitis without obstruction: Secondary | ICD-10-CM | POA: Diagnosis not present

## 2023-02-14 LAB — LIPASE, BLOOD: Lipase: 33 U/L (ref 11–51)

## 2023-02-14 LAB — COMPREHENSIVE METABOLIC PANEL
ALT: 15 U/L (ref 0–44)
AST: 20 U/L (ref 15–41)
Albumin: 4.5 g/dL (ref 3.5–5.0)
Alkaline Phosphatase: 61 U/L (ref 38–126)
Anion gap: 11 (ref 5–15)
BUN: 21 mg/dL — ABNORMAL HIGH (ref 6–20)
CO2: 27 mmol/L (ref 22–32)
Calcium: 8.7 mg/dL — ABNORMAL LOW (ref 8.9–10.3)
Chloride: 100 mmol/L (ref 98–111)
Creatinine, Ser: 0.94 mg/dL (ref 0.44–1.00)
GFR, Estimated: >60 mL/min (ref >60)
Glucose, Bld: 101 mg/dL — ABNORMAL HIGH (ref 70–99)
Potassium: 3.5 mmol/L (ref 3.5–5.1)
Sodium: 138 mmol/L (ref 135–145)
Total Bilirubin: 0.4 mg/dL (ref 0.3–1.2)
Total Protein: 8.3 g/dL — ABNORMAL HIGH (ref 6.5–8.1)

## 2023-02-14 LAB — CBC
HCT: 44.7 % (ref 36.0–46.0)
Hemoglobin: 14.3 g/dL (ref 12.0–15.0)
MCH: 26.7 pg (ref 26.0–34.0)
MCHC: 32 g/dL (ref 30.0–36.0)
MCV: 83.6 fL (ref 80.0–100.0)
Platelets: 239 10*3/uL (ref 150–400)
RBC: 5.35 MIL/uL — ABNORMAL HIGH (ref 3.87–5.11)
RDW: 13.7 % (ref 11.5–15.5)
WBC: 9.3 10*3/uL (ref 4.0–10.5)
nRBC: 0 % (ref 0.0–0.2)

## 2023-02-14 LAB — URINALYSIS, ROUTINE W REFLEX MICROSCOPIC
Bilirubin Urine: NEGATIVE
Glucose, UA: NEGATIVE mg/dL
Hgb urine dipstick: NEGATIVE
Ketones, ur: NEGATIVE mg/dL
Leukocytes,Ua: NEGATIVE
Nitrite: NEGATIVE
Protein, ur: NEGATIVE mg/dL
Specific Gravity, Urine: 1.025 (ref 1.005–1.030)
pH: 7 (ref 5.0–8.0)

## 2023-02-14 MED ORDER — SODIUM CHLORIDE 0.9 % IV BOLUS
1000.0000 mL | Freq: Once | INTRAVENOUS | Status: AC
  Administered 2023-02-14: 1000 mL via INTRAVENOUS

## 2023-02-14 MED ORDER — ONDANSETRON HCL 4 MG/2ML IJ SOLN
4.0000 mg | Freq: Once | INTRAMUSCULAR | Status: AC
  Administered 2023-02-14: 4 mg via INTRAVENOUS
  Filled 2023-02-14: qty 2

## 2023-02-14 MED ORDER — HYDROMORPHONE HCL 1 MG/ML IJ SOLN
0.5000 mg | Freq: Once | INTRAMUSCULAR | Status: AC
  Administered 2023-02-14: 0.5 mg via INTRAVENOUS
  Filled 2023-02-14: qty 1

## 2023-02-14 MED ORDER — OXYCODONE-ACETAMINOPHEN 5-325 MG PO TABS: 1.0000 | ORAL_TABLET | Freq: Four times a day (QID) | ORAL | 0 refills | Status: AC | PRN

## 2023-02-14 MED ORDER — HYDROMORPHONE HCL 1 MG/ML IJ SOLN
1.0000 mg | Freq: Once | INTRAMUSCULAR | Status: AC
  Administered 2023-02-14: 1 mg via INTRAVENOUS
  Filled 2023-02-14: qty 1

## 2023-02-14 NOTE — ED Provider Notes (Signed)
[place] [facility] AT [facility] Provider Note   CSN: [id number] Arrival date & time: 02/14/23  1533     History Chief Complaint  Patient presents with   Abdominal Pain    Tanya Carr is a 52 y.o. female.  Patient presents to the emergency department complaints of right upper quadrant abdominal pain.  Reports that she was previously seen in the emergency department several months ago for similar symptoms.  At that time she was diagnosed with uncomplicated cholecystitis and had symptomatic resolution without any complicating factors.  Patient reports that she currently has GI follow-up scheduled for about 2 and half weeks from now.  States that she is not getting to the point of having 4-5 episodes of right upper quadrant abdominal pain per week.  Has reportedly tried to modify her diet without significant or sustained improvement in symptoms.  Denies any fevers, vomiting, diarrhea.  Does endorse some associated nausea without resolution.  Still tolerating oral intake without significant difficulty.   Abdominal Pain Associated symptoms: nausea        Home Medications Prior to Admission medications   Medication Sig Start Date End Date Taking? Authorizing Provider  oxyCODONE-acetaminophen (PERCOCET/ROXICET) 5-325 MG tablet Take 1 tablet by mouth every 6 (six) hours as needed for severe pain. 02/14/23  Yes Maryanna Shape A, PA-C  amLODipine (NORVASC) 10 MG tablet Take 1 tablet (10 mg total) by mouth daily. 06/04/22   Nche, Bonna Gains, NP  cyclobenzaprine (FLEXERIL) 10 MG tablet TAKE 1 TABLET BY MOUTH AT BEDTIME AS NEEDED FOR MUSCLE SPASMS. 02/07/22   Nche, Bonna Gains, NP  ibuprofen (ADVIL) 400 MG tablet Take 1.5 tablets (600 mg total) by mouth every 8 (eight) hours as needed. 11/09/20   Nche, Bonna Gains, NP  lisinopril (ZESTRIL) 30 MG tablet Take 1 tablet (30 mg total) by mouth daily. 06/04/22   Nche, Bonna Gains, NP  omeprazole (PRILOSEC) 20 MG capsule  Take 1 capsule (20 mg total) by mouth daily. 06/04/22   Nche, Bonna Gains, NP      Allergies    Bee pollen, Food, Shellfish allergy, Vit d-vit e-safflower oil, and Vitamin d analogs    Review of Systems   Review of Systems  Gastrointestinal:  Positive for abdominal pain and nausea.  All other systems reviewed and are negative.   Physical Exam Updated Vital Signs BP 112/70   Pulse 61   Temp 98.3 F (36.8 C) (Oral)   Resp 18   Ht 5\' 4"  (1.626 m)   Wt 90.7 kg   LMP 11/10/2011   SpO2 95%   BMI 34.33 kg/m  Physical Exam Vitals and nursing note reviewed.  Constitutional:      General: She is not in acute distress.    Appearance: She is well-developed.  HENT:     Head: Normocephalic and atraumatic.  Eyes:     General: No scleral icterus.    Conjunctiva/sclera: Conjunctivae normal.  Cardiovascular:     Rate and Rhythm: Normal rate and regular rhythm.     Heart sounds: No murmur heard. Pulmonary:     Effort: Pulmonary effort is normal. No respiratory distress.     Breath sounds: Normal breath sounds.  Abdominal:     Palpations: Abdomen is soft.     Tenderness: There is abdominal tenderness in the right upper quadrant and epigastric area. There is no guarding or rebound.  Musculoskeletal:        General: No swelling.     Cervical  back: Neck supple.  Skin:    General: Skin is warm and dry.     Capillary Refill: Capillary refill takes less than 2 seconds.  Neurological:     Mental Status: She is alert.  Psychiatric:        Mood and Affect: Mood normal.     ED Results / Procedures / Treatments   Labs (all labs ordered are listed, but only abnormal results are displayed) Labs Reviewed  COMPREHENSIVE METABOLIC PANEL - Abnormal; Notable for the following components:      Result Value   Glucose, Bld 101 (*)    BUN 21 (*)    Calcium 8.7 (*)    Total Protein 8.3 (*)    All other components within normal limits  CBC - Abnormal; Notable for the following components:    RBC 5.35 (*)    All other components within normal limits  LIPASE, BLOOD  URINALYSIS, ROUTINE W REFLEX MICROSCOPIC    EKG None  Radiology No results found.  Procedures Procedures   Medications Ordered in ED Medications  sodium chloride 0.9 % bolus 1,000 mL ( Intravenous Stopped 02/14/23 2000)  ondansetron (ZOFRAN) injection 4 mg (4 mg Intravenous Given 02/14/23 1853)  HYDROmorphone (DILAUDID) injection 0.5 mg (0.5 mg Intravenous Given 02/14/23 1854)  HYDROmorphone (DILAUDID) injection 1 mg (1 mg Intravenous Given 02/14/23 2021)    ED Course/ Medical Decision Making/ A&P                           Medical Decision Making Amount and/or Complexity of Data Reviewed Labs: ordered. Radiology: ordered.  Risk Prescription drug management.   This patient presents to the ED for concern of abdominal pain.  Differential diagnosis includes appendicitis, cholecystitis, pancreatitis, bowel obstruction   Lab Tests:  I Ordered, and personally interpreted labs.  The pertinent results include: CBC unremarkable, CMP unremarkable, lipase negative, UA without evidence of infection   Imaging Studies ordered:  I ordered imaging studies including ultrasound right upper quadrant I independently visualized and interpreted imaging which showed cholelithiasis with some gallbladder wall thickening but no sonographic Murphy sign I agree with the radiologist interpretation   Medicines ordered and prescription drug management:  I ordered medication including Zofran, fluids for nausea Reevaluation of the patient after these medicines showed that the patient improved I have reviewed the patients home medicines and have made adjustments as needed   Problem List / ED Course:  Patient presents to the emergency department complaints of abdominal pain.  Reports that she was seen in the emergency department several months ago for similar symptoms and diagnosed with acute cholecystitis uncomplicated.   Patient has had improvement in symptoms since that time but has had a relapse recently with symptoms flaring up approximately 4-5 times per week.  Endorses some nausea but denies any vomiting.  States that she currently has GI follow-up scheduled for 2 and half weeks from now.  Will evaluate patient with labs and ultrasound right upper quadrant.  Will initiate fluid resuscitation as well as antiemetic treatment. Will reassess patient for treatment response. Patient reports significant improvement in pain after administration of Dilaudid.  Although patient has not followed up with GI, but the patient ultimately benefit from follow-up with general surgery as this is a surgical finding that may need intervention if patient continues to have recurrence of the symptoms.  Symptoms are highly consistent with biliary colic over cholecystitis given negative Murphy sign and lack of other systemic  symptoms such as leukocytosis or fever.  Provided patient with information for [facility] surgery.  Encourage patient to still keep her appointment with GI.  Will provide patient with a prescription for Percocet to take at home for continued pain control.  Patient is agreeable to treatment plan and verbalized understanding all return precautions.  All questions answered prior to patient discharge.  Final Clinical Impression(s) / ED Diagnoses Final diagnoses:  Biliary colic    Rx / DC Orders ED Discharge Orders          Ordered    Ambulatory referral to General Surgery        02/14/23 2142    oxyCODONE-acetaminophen (PERCOCET/ROXICET) 5-325 MG tablet  Every 6 hours PRN        02/14/23 2143              Smitty Knudsen, PA-C 02/19/23 0017    Charlynne Pander, MD 02/22/23 [phone]

## 2023-02-14 NOTE — ED Triage Notes (Signed)
Pt reports upper abd pain that is similar to gall bladder pain. Says she gets pain from gall bladder 4-5x a week. Working on getting in with gastroenterology. Nausea but no v/d.

## 2023-02-14 NOTE — Discharge Instructions (Addendum)
You are seen in the emergency department for abdominal pain.  Your labs were reassuring and your ultrasound did show evidence of cholelithiasis with some evidence of possible acute cholecystitis given the lack of palpable pain in the area, your symptoms are more consistent with biliary colic.  You likely would benefit from evaluation with general surgery for further evaluation and management of your symptoms.  No prescription for Percocet is also been sent to your pharmacy.  Please take this medication for more severe pain.  If you have any acute worsening or symptoms, please return to the emergency department.

## 2023-02-18 ENCOUNTER — Inpatient Hospital Stay (HOSPITAL_BASED_OUTPATIENT_CLINIC_OR_DEPARTMENT_OTHER)
Admission: EM | Admit: 2023-02-18 | Discharge: 2023-02-20 | DRG: 419 | Disposition: A | Discharge status: Home / Self Care | Payer: PRIVATE HEALTH INSURANCE | Attending: Surgery | Admitting: Surgery

## 2023-02-18 ENCOUNTER — Encounter (HOSPITAL_BASED_OUTPATIENT_CLINIC_OR_DEPARTMENT_OTHER): Payer: Self-pay | Admitting: Emergency Medicine

## 2023-02-18 ENCOUNTER — Other Ambulatory Visit: Payer: Self-pay

## 2023-02-18 DIAGNOSIS — Z90710 Acquired absence of both cervix and uterus: Secondary | ICD-10-CM

## 2023-02-18 DIAGNOSIS — Z6834 Body mass index (BMI) 34.0-34.9, adult: Secondary | ICD-10-CM

## 2023-02-18 DIAGNOSIS — K219 Gastro-esophageal reflux disease without esophagitis: Secondary | ICD-10-CM | POA: Diagnosis present

## 2023-02-18 DIAGNOSIS — Z91013 Allergy to seafood: Secondary | ICD-10-CM

## 2023-02-18 DIAGNOSIS — K8012 Calculus of gallbladder with acute and chronic cholecystitis without obstruction: Secondary | ICD-10-CM | POA: Diagnosis not present

## 2023-02-18 DIAGNOSIS — Z888 Allergy status to other drugs, medicaments and biological substances status: Secondary | ICD-10-CM

## 2023-02-18 DIAGNOSIS — Z91030 Bee allergy status: Secondary | ICD-10-CM

## 2023-02-18 DIAGNOSIS — K810 Acute cholecystitis: Principal | ICD-10-CM | POA: Diagnosis present

## 2023-02-18 DIAGNOSIS — Z820 Family history of epilepsy and other diseases of the nervous system: Secondary | ICD-10-CM

## 2023-02-18 DIAGNOSIS — Z79899 Other long term (current) drug therapy: Secondary | ICD-10-CM

## 2023-02-18 DIAGNOSIS — Z825 Family history of asthma and other chronic lower respiratory diseases: Secondary | ICD-10-CM

## 2023-02-18 DIAGNOSIS — I10 Essential (primary) hypertension: Secondary | ICD-10-CM | POA: Diagnosis present

## 2023-02-18 DIAGNOSIS — Z8249 Family history of ischemic heart disease and other diseases of the circulatory system: Secondary | ICD-10-CM

## 2023-02-18 DIAGNOSIS — E669 Obesity, unspecified: Secondary | ICD-10-CM | POA: Diagnosis present

## 2023-02-18 DIAGNOSIS — K82A1 Gangrene of gallbladder in cholecystitis: Secondary | ICD-10-CM | POA: Diagnosis present

## 2023-02-18 DIAGNOSIS — Z87891 Personal history of nicotine dependence: Secondary | ICD-10-CM

## 2023-02-18 LAB — CBC
HCT: 40.3 % (ref 36.0–46.0)
Hemoglobin: 12.9 g/dL (ref 12.0–15.0)
MCH: 26.4 pg (ref 26.0–34.0)
MCHC: 32 g/dL (ref 30.0–36.0)
MCV: 82.4 fL (ref 80.0–100.0)
Platelets: 271 10*3/uL (ref 150–400)
RBC: 4.89 MIL/uL (ref 3.87–5.11)
RDW: 13.7 % (ref 11.5–15.5)
WBC: 12.7 10*3/uL — ABNORMAL HIGH (ref 4.0–10.5)
nRBC: 0 % (ref 0.0–0.2)

## 2023-02-18 LAB — COMPREHENSIVE METABOLIC PANEL
ALT: 15 U/L (ref 0–44)
AST: 21 U/L (ref 15–41)
Albumin: 4.2 g/dL (ref 3.5–5.0)
Alkaline Phosphatase: 57 U/L (ref 38–126)
Anion gap: 11 (ref 5–15)
BUN: 16 mg/dL (ref 6–20)
CO2: 25 mmol/L (ref 22–32)
Calcium: 8.9 mg/dL (ref 8.9–10.3)
Chloride: 99 mmol/L (ref 98–111)
Creatinine, Ser: 0.96 mg/dL (ref 0.44–1.00)
GFR, Estimated: >60 mL/min (ref >60)
Glucose, Bld: 170 mg/dL — ABNORMAL HIGH (ref 70–99)
Potassium: 3.3 mmol/L — ABNORMAL LOW (ref 3.5–5.1)
Sodium: 135 mmol/L (ref 135–145)
Total Bilirubin: 0.5 mg/dL (ref 0.3–1.2)
Total Protein: 7.9 g/dL (ref 6.5–8.1)

## 2023-02-18 LAB — URINALYSIS, ROUTINE W REFLEX MICROSCOPIC
Bilirubin Urine: NEGATIVE
Glucose, UA: NEGATIVE mg/dL
Hgb urine dipstick: NEGATIVE
Ketones, ur: 15 mg/dL — AB
Leukocytes,Ua: NEGATIVE
Nitrite: NEGATIVE
Protein, ur: NEGATIVE mg/dL
Specific Gravity, Urine: >=1.03 (ref 1.005–1.030)
pH: 6 (ref 5.0–8.0)

## 2023-02-18 LAB — LIPASE, BLOOD: Lipase: 28 U/L (ref 11–51)

## 2023-02-18 NOTE — ED Triage Notes (Signed)
Patient arrived via POV c/o abdominal pain x 10hrs pta. Patient seen recently w/ dx of billary colic. Patient states pain starting after lunch, increasing during evening. Patient states new nausea and vomiting. Patient states 3 episodes of emesis. Patient is AO x 4, VS WDL, normal gait.

## 2023-02-19 ENCOUNTER — Other Ambulatory Visit: Payer: Self-pay

## 2023-02-19 ENCOUNTER — Inpatient Hospital Stay (HOSPITAL_COMMUNITY): Payer: PRIVATE HEALTH INSURANCE | Admitting: Certified Registered"

## 2023-02-19 ENCOUNTER — Encounter (HOSPITAL_COMMUNITY): Payer: Self-pay

## 2023-02-19 ENCOUNTER — Encounter (HOSPITAL_COMMUNITY): Admission: EM | Disposition: A | Discharge status: Home / Self Care | Payer: Self-pay | Source: Home / Self Care

## 2023-02-19 DIAGNOSIS — Z79899 Other long term (current) drug therapy: Secondary | ICD-10-CM | POA: Diagnosis not present

## 2023-02-19 DIAGNOSIS — Z91030 Bee allergy status: Secondary | ICD-10-CM | POA: Diagnosis not present

## 2023-02-19 DIAGNOSIS — Z825 Family history of asthma and other chronic lower respiratory diseases: Secondary | ICD-10-CM | POA: Diagnosis not present

## 2023-02-19 DIAGNOSIS — K8012 Calculus of gallbladder with acute and chronic cholecystitis without obstruction: Secondary | ICD-10-CM | POA: Diagnosis present

## 2023-02-19 DIAGNOSIS — Z90710 Acquired absence of both cervix and uterus: Secondary | ICD-10-CM | POA: Diagnosis not present

## 2023-02-19 DIAGNOSIS — I10 Essential (primary) hypertension: Secondary | ICD-10-CM

## 2023-02-19 DIAGNOSIS — K8000 Calculus of gallbladder with acute cholecystitis without obstruction: Secondary | ICD-10-CM

## 2023-02-19 DIAGNOSIS — E669 Obesity, unspecified: Secondary | ICD-10-CM | POA: Diagnosis present

## 2023-02-19 DIAGNOSIS — K810 Acute cholecystitis: Principal | ICD-10-CM | POA: Diagnosis present

## 2023-02-19 DIAGNOSIS — K82A1 Gangrene of gallbladder in cholecystitis: Secondary | ICD-10-CM | POA: Diagnosis present

## 2023-02-19 DIAGNOSIS — Z8249 Family history of ischemic heart disease and other diseases of the circulatory system: Secondary | ICD-10-CM | POA: Diagnosis not present

## 2023-02-19 DIAGNOSIS — Z6834 Body mass index (BMI) 34.0-34.9, adult: Secondary | ICD-10-CM | POA: Diagnosis not present

## 2023-02-19 DIAGNOSIS — Z87891 Personal history of nicotine dependence: Secondary | ICD-10-CM

## 2023-02-19 DIAGNOSIS — Z91013 Allergy to seafood: Secondary | ICD-10-CM | POA: Diagnosis not present

## 2023-02-19 DIAGNOSIS — Z888 Allergy status to other drugs, medicaments and biological substances status: Secondary | ICD-10-CM | POA: Diagnosis not present

## 2023-02-19 DIAGNOSIS — K219 Gastro-esophageal reflux disease without esophagitis: Secondary | ICD-10-CM | POA: Diagnosis present

## 2023-02-19 DIAGNOSIS — G4730 Sleep apnea, unspecified: Secondary | ICD-10-CM | POA: Diagnosis not present

## 2023-02-19 DIAGNOSIS — Z820 Family history of epilepsy and other diseases of the nervous system: Secondary | ICD-10-CM | POA: Diagnosis not present

## 2023-02-19 HISTORY — PX: CHOLECYSTECTOMY: SHX55

## 2023-02-19 LAB — COMPREHENSIVE METABOLIC PANEL
ALT: 16 U/L (ref 0–44)
AST: 19 U/L (ref 15–41)
Albumin: 4.2 g/dL (ref 3.5–5.0)
Alkaline Phosphatase: 57 U/L (ref 38–126)
Anion gap: 9 (ref 5–15)
BUN: 12 mg/dL (ref 6–20)
CO2: 25 mmol/L (ref 22–32)
Calcium: 8.4 mg/dL — ABNORMAL LOW (ref 8.9–10.3)
Chloride: 100 mmol/L (ref 98–111)
Creatinine, Ser: 0.83 mg/dL (ref 0.44–1.00)
GFR, Estimated: >60 mL/min (ref >60)
Glucose, Bld: 163 mg/dL — ABNORMAL HIGH (ref 70–99)
Potassium: 3.5 mmol/L (ref 3.5–5.1)
Sodium: 134 mmol/L — ABNORMAL LOW (ref 135–145)
Total Bilirubin: 0.5 mg/dL (ref 0.3–1.2)
Total Protein: 7.6 g/dL (ref 6.5–8.1)

## 2023-02-19 LAB — HIV ANTIBODY (ROUTINE TESTING W REFLEX): HIV Screen 4th Generation wRfx: NONREACTIVE

## 2023-02-19 SURGERY — LAPAROSCOPIC CHOLECYSTECTOMY
Anesthesia: General

## 2023-02-19 MED ORDER — MIDAZOLAM HCL 2 MG/2ML IJ SOLN
INTRAMUSCULAR | Status: AC
  Filled 2023-02-19: qty 2

## 2023-02-19 MED ORDER — HEMOSTATIC AGENTS (NO CHARGE) OPTIME
TOPICAL | Status: DC | PRN
  Administered 2023-02-19: 1 via TOPICAL

## 2023-02-19 MED ORDER — ONDANSETRON 4 MG PO TBDP: 4.0000 mg | ORAL_TABLET | Freq: Four times a day (QID) | ORAL | Status: DC | PRN

## 2023-02-19 MED ORDER — HYDROMORPHONE HCL 1 MG/ML IJ SOLN
1.0000 mg | Freq: Once | INTRAMUSCULAR | Status: AC
  Administered 2023-02-19: 1 mg via INTRAVENOUS
  Filled 2023-02-19: qty 1

## 2023-02-19 MED ORDER — MEPERIDINE HCL 50 MG/ML IJ SOLN: 6.2500 mg | INTRAMUSCULAR | Status: DC | PRN

## 2023-02-19 MED ORDER — ONDANSETRON HCL 4 MG/2ML IJ SOLN
4.0000 mg | Freq: Once | INTRAMUSCULAR | Status: AC
  Administered 2023-02-19: 4 mg via INTRAVENOUS
  Filled 2023-02-19: qty 2

## 2023-02-19 MED ORDER — PROMETHAZINE HCL 25 MG/ML IJ SOLN: 6.2500 mg | INTRAMUSCULAR | Status: DC | PRN

## 2023-02-19 MED ORDER — BUPIVACAINE HCL (PF) 0.5 % IJ SOLN
INTRAMUSCULAR | Status: AC
  Filled 2023-02-19: qty 30

## 2023-02-19 MED ORDER — ACETAMINOPHEN 10 MG/ML IV SOLN
1000.0000 mg | Freq: Once | INTRAVENOUS | Status: AC
  Administered 2023-02-19: 1000 mg via INTRAVENOUS

## 2023-02-19 MED ORDER — FENTANYL CITRATE (PF) 100 MCG/2ML IJ SOLN
INTRAMUSCULAR | Status: DC | PRN
  Administered 2023-02-19 (×2): 50 ug via INTRAVENOUS

## 2023-02-19 MED ORDER — OXYCODONE HCL 5 MG/5ML PO SOLN: 5.0000 mg | Freq: Once | ORAL | Status: DC | PRN

## 2023-02-19 MED ORDER — HYDROMORPHONE HCL 1 MG/ML IJ SOLN: 0.5000 mg | INTRAMUSCULAR | Status: DC | PRN

## 2023-02-19 MED ORDER — HYDROMORPHONE HCL 1 MG/ML IJ SOLN
1.0000 mg | INTRAMUSCULAR | Status: DC | PRN
  Administered 2023-02-19 (×2): 1 mg via INTRAVENOUS
  Filled 2023-02-19 (×2): qty 1

## 2023-02-19 MED ORDER — BUPIVACAINE HCL (PF) 0.5 % IJ SOLN
INTRAMUSCULAR | Status: DC | PRN
  Administered 2023-02-19: 20 mL

## 2023-02-19 MED ORDER — SODIUM CHLORIDE 0.9 % IV SOLN: INTRAVENOUS | Status: DC

## 2023-02-19 MED ORDER — FENTANYL CITRATE (PF) 100 MCG/2ML IJ SOLN
INTRAMUSCULAR | Status: AC
  Filled 2023-02-19: qty 2

## 2023-02-19 MED ORDER — PROPOFOL 10 MG/ML IV BOLUS
INTRAVENOUS | Status: DC | PRN
  Administered 2023-02-19: 150 mg via INTRAVENOUS

## 2023-02-19 MED ORDER — KETOROLAC TROMETHAMINE 30 MG/ML IJ SOLN
INTRAMUSCULAR | Status: AC
  Filled 2023-02-19: qty 1

## 2023-02-19 MED ORDER — HYDROMORPHONE HCL 1 MG/ML IJ SOLN: 0.2500 mg | INTRAMUSCULAR | Status: DC | PRN

## 2023-02-19 MED ORDER — SODIUM CHLORIDE 0.9 % IV SOLN
2.0000 g | INTRAVENOUS | Status: DC
  Administered 2023-02-19: 2 g via INTRAVENOUS
  Filled 2023-02-19: qty 20

## 2023-02-19 MED ORDER — AMISULPRIDE (ANTIEMETIC) 5 MG/2ML IV SOLN: 10.0000 mg | Freq: Once | INTRAVENOUS | Status: DC | PRN

## 2023-02-19 MED ORDER — SODIUM CHLORIDE 0.9 % IV SOLN
2.0000 g | INTRAVENOUS | Status: AC
  Administered 2023-02-20: 2 g via INTRAVENOUS
  Filled 2023-02-19: qty 20

## 2023-02-19 MED ORDER — OXYCODONE HCL 5 MG PO TABS
5.0000 mg | ORAL_TABLET | ORAL | Status: DC | PRN
  Administered 2023-02-20 (×2): 10 mg via ORAL
  Filled 2023-02-19 (×2): qty 2

## 2023-02-19 MED ORDER — LIDOCAINE 2% (20 MG/ML) 5 ML SYRINGE
INTRAMUSCULAR | Status: DC | PRN
  Administered 2023-02-19: 50 mg via INTRAVENOUS

## 2023-02-19 MED ORDER — MIDAZOLAM HCL 5 MG/5ML IJ SOLN
INTRAMUSCULAR | Status: DC | PRN
  Administered 2023-02-19: 2 mg via INTRAVENOUS

## 2023-02-19 MED ORDER — MORPHINE SULFATE (PF) 4 MG/ML IV SOLN
4.0000 mg | Freq: Once | INTRAVENOUS | Status: AC
  Administered 2023-02-19: 4 mg via INTRAVENOUS
  Filled 2023-02-19: qty 1

## 2023-02-19 MED ORDER — DEXAMETHASONE SODIUM PHOSPHATE 10 MG/ML IJ SOLN
INTRAMUSCULAR | Status: AC
  Filled 2023-02-19: qty 1

## 2023-02-19 MED ORDER — OXYCODONE HCL 5 MG PO TABS: 5.0000 mg | ORAL_TABLET | Freq: Once | ORAL | Status: DC | PRN

## 2023-02-19 MED ORDER — ACETAMINOPHEN 10 MG/ML IV SOLN
INTRAVENOUS | Status: AC
  Filled 2023-02-19: qty 100

## 2023-02-19 MED ORDER — LISINOPRIL 20 MG PO TABS
30.0000 mg | ORAL_TABLET | Freq: Every day | ORAL | Status: DC
  Administered 2023-02-19: 30 mg via ORAL
  Filled 2023-02-19: qty 1

## 2023-02-19 MED ORDER — LACTATED RINGERS IR SOLN
Status: DC | PRN
  Administered 2023-02-19: 1000 mL

## 2023-02-19 MED ORDER — ACETAMINOPHEN 500 MG PO TABS
1000.0000 mg | ORAL_TABLET | Freq: Four times a day (QID) | ORAL | Status: DC | PRN
  Administered 2023-02-19: 1000 mg via ORAL
  Filled 2023-02-19: qty 2

## 2023-02-19 MED ORDER — 0.9 % SODIUM CHLORIDE (POUR BTL) OPTIME
TOPICAL | Status: DC | PRN
  Administered 2023-02-19: 1000 mL

## 2023-02-19 MED ORDER — PROPOFOL 10 MG/ML IV BOLUS
INTRAVENOUS | Status: AC
  Filled 2023-02-19: qty 20

## 2023-02-19 MED ORDER — DEXTROSE-SODIUM CHLORIDE 5-0.9 % IV SOLN: INTRAVENOUS | Status: DC

## 2023-02-19 MED ORDER — ONDANSETRON HCL 4 MG/2ML IJ SOLN: 4.0000 mg | Freq: Four times a day (QID) | INTRAMUSCULAR | Status: DC | PRN

## 2023-02-19 MED ORDER — SODIUM CHLORIDE 0.9 % IV BOLUS
1000.0000 mL | Freq: Once | INTRAVENOUS | Status: AC
  Administered 2023-02-19: 1000 mL via INTRAVENOUS

## 2023-02-19 MED ORDER — ONDANSETRON HCL 4 MG/2ML IJ SOLN
INTRAMUSCULAR | Status: AC
  Filled 2023-02-19: qty 2

## 2023-02-19 MED ORDER — METHOCARBAMOL 500 MG PO TABS
500.0000 mg | ORAL_TABLET | Freq: Four times a day (QID) | ORAL | Status: DC | PRN
  Administered 2023-02-19 – 2023-02-20 (×2): 500 mg via ORAL
  Filled 2023-02-19 (×2): qty 1

## 2023-02-19 MED ORDER — ONDANSETRON HCL 4 MG/2ML IJ SOLN
4.0000 mg | Freq: Four times a day (QID) | INTRAMUSCULAR | Status: DC | PRN
  Administered 2023-02-19 (×2): 4 mg via INTRAVENOUS
  Filled 2023-02-19: qty 2

## 2023-02-19 MED ORDER — ROCURONIUM BROMIDE 10 MG/ML (PF) SYRINGE
PREFILLED_SYRINGE | INTRAVENOUS | Status: AC
  Filled 2023-02-19: qty 10

## 2023-02-19 MED ORDER — SUGAMMADEX SODIUM 200 MG/2ML IV SOLN
INTRAVENOUS | Status: DC | PRN
  Administered 2023-02-19: 200 mg via INTRAVENOUS

## 2023-02-19 MED ORDER — AMLODIPINE BESYLATE 10 MG PO TABS
10.0000 mg | ORAL_TABLET | Freq: Every day | ORAL | Status: DC
  Administered 2023-02-19: 10 mg via ORAL
  Filled 2023-02-19: qty 1

## 2023-02-19 MED ORDER — DEXAMETHASONE SODIUM PHOSPHATE 4 MG/ML IJ SOLN
INTRAMUSCULAR | Status: DC | PRN
  Administered 2023-02-19: 5 mg via INTRAVENOUS

## 2023-02-19 MED ORDER — ENOXAPARIN SODIUM 40 MG/0.4ML IJ SOSY: 40.0000 mg | PREFILLED_SYRINGE | INTRAMUSCULAR | Status: DC

## 2023-02-19 MED ORDER — ROCURONIUM BROMIDE 10 MG/ML (PF) SYRINGE
PREFILLED_SYRINGE | INTRAVENOUS | Status: DC | PRN
  Administered 2023-02-19: 60 mg via INTRAVENOUS

## 2023-02-19 MED ORDER — LIDOCAINE HCL (PF) 2 % IJ SOLN
INTRAMUSCULAR | Status: AC
  Filled 2023-02-19: qty 5

## 2023-02-19 MED ORDER — LACTATED RINGERS IV SOLN: INTRAVENOUS | Status: DC

## 2023-02-19 SURGICAL SUPPLY — 36 items
ADH SKN CLS APL DERMABOND .7 (GAUZE/BANDAGES/DRESSINGS) ×1
APL PRP STRL LF DISP 70% ISPRP (MISCELLANEOUS) ×1
APPLIER CLIP 5 13 M/L LIGAMAX5 (MISCELLANEOUS) ×1
APR CLP MED LRG 5 ANG JAW (MISCELLANEOUS) ×1
BAG COUNTER SPONGE SURGICOUNT (BAG) IMPLANT
BAG SPNG CNTER NS LX DISP (BAG)
CABLE HIGH FREQUENCY MONO STRZ (ELECTRODE) ×1 IMPLANT
CHLORAPREP W/TINT 26 (MISCELLANEOUS) ×1 IMPLANT
CLIP APPLIE 5 13 M/L LIGAMAX5 (MISCELLANEOUS) ×1 IMPLANT
COVER MAYO STAND XLG (MISCELLANEOUS) ×1 IMPLANT
DERMABOND ADVANCED .7 DNX12 (GAUZE/BANDAGES/DRESSINGS) ×1 IMPLANT
DRAPE C-ARM 42X120 X-RAY (DRAPES) IMPLANT
ELECT REM PT RETURN 15FT ADLT (MISCELLANEOUS) ×1 IMPLANT
GLOVE BIO SURGEON STRL SZ7.5 (GLOVE) ×1 IMPLANT
GOWN STRL REUS W/ TWL XL LVL3 (GOWN DISPOSABLE) ×2 IMPLANT
GOWN STRL REUS W/TWL XL LVL3 (GOWN DISPOSABLE) ×2
HEMOSTAT SNOW SURGICEL 2X4 (HEMOSTASIS) IMPLANT
HEMOSTAT SURGICEL 4X8 (HEMOSTASIS) IMPLANT
IRRIG SUCT STRYKERFLOW 2 WTIP (MISCELLANEOUS) ×1
IRRIGATION SUCT STRKRFLW 2 WTP (MISCELLANEOUS) ×1 IMPLANT
KIT BASIN OR (CUSTOM PROCEDURE TRAY) ×1 IMPLANT
KIT TURNOVER KIT A (KITS) IMPLANT
PENCIL SMOKE EVACUATOR (MISCELLANEOUS) IMPLANT
SCISSORS LAP 5X35 DISP (ENDOMECHANICALS) ×1 IMPLANT
SET CHOLANGIOGRAPH MIX (MISCELLANEOUS) IMPLANT
SET TUBE SMOKE EVAC HIGH FLOW (TUBING) ×1 IMPLANT
SLEEVE Z-THREAD 5X100MM (TROCAR) ×2 IMPLANT
SPIKE FLUID TRANSFER (MISCELLANEOUS) ×1 IMPLANT
SUT MNCRL AB 4-0 PS2 18 (SUTURE) ×1 IMPLANT
SYS BAG RETRIEVAL 10MM (BASKET) ×1
SYSTEM BAG RETRIEVAL 10MM (BASKET) ×1 IMPLANT
TOWEL OR 17X26 10 PK STRL BLUE (TOWEL DISPOSABLE) ×1 IMPLANT
TOWEL OR NON WOVEN STRL DISP B (DISPOSABLE) ×1 IMPLANT
TRAY LAPAROSCOPIC (CUSTOM PROCEDURE TRAY) ×1 IMPLANT
TROCAR BALLN 12MMX100 BLUNT (TROCAR) ×1 IMPLANT
TROCAR Z-THREAD OPTICAL 5X100M (TROCAR) ×1 IMPLANT

## 2023-02-19 NOTE — H&P (Addendum)
Tanya Carr 22-Dec-1970  [id number].    Requesting MD: Dr. Judd Lien Chief Complaint/Reason for Consult: Acute Cholecystitis   HPI: Tanya Carr is a 52 y.o. female with a hx of HTN and GERD who presented to [facility] ED w/ abdominal pain. Patient seen for similar complaint on 6/15. W/u at that time with gallstone in the gallbladder neck measuring 1.4 x 0.8 x 2.4cm with gallbladder wall thickening and pericholecystic fluid. CBD wnl at 4mm. WBC, Lipase and LFT's wnl. She was d/c home with pain medication and referral to [facility]. Presented with ongoing RUQ pain x 4-5 days after po intake. Over the last 24 hours the pain has become constant, radiates to her back and associated with n/v. Pain unrelieved by percocet at home. No fever, constipation, diarrhea or urinary symptoms.   Here she was afebrile without tachycardia or hypotension. WBC 12.7. LFTs and Lipase wnl. No repeat US was done. Patient was started on Rocephin and transferred to [facility] for evaluation.   Prior Abdominal Surgeries: Laparoscopic Cholecystectomy  Blood Thinners: None Last PO intake: 6/19 Tobacco Use: None Alcohol Use: None Substance use: None  Employment: Emergency planning/management officer  Lives at home with her fiance   Denies a history of MI, CVA, asthma, or COPD. At baseline states he mobilizes without an assistive device and can climb a flight of stairs without stopping due to DOE or chest pain.  ROS: ROS As above, see hpi  Family History  Problem Relation Age of Onset   Cancer Mother        Lung   Multiple sclerosis Mother    COPD Father    Pulmonary embolism Brother    Heart disease Maternal Grandmother    Sarcoidosis Paternal Grandmother    Colon cancer Neg Hx    Colon polyps Neg Hx    Esophageal cancer Neg Hx    Rectal cancer Neg Hx    Stomach cancer Neg Hx     Past Medical History:  Diagnosis Date   Allergy    seasonal   Fainting episodes 2011   history of with blood draw, mammogram, post op 2011    Headache(784.0)    history of migraines   Hypertension    on lisinopril-will do ekg today   Palpitations    PVC's   PONV (postoperative nausea and vomiting)    vagal response per pt    Past Surgical History:  Procedure Laterality Date   ABDOMINAL HYSTERECTOMY     LUMBAR DISC SURGERY     ulnar nerve decompression Bilateral 2011    Social History:  reports that she quit smoking about 26 years ago. Her smoking use included cigarettes. She has never used smokeless tobacco. She reports current alcohol use. She reports that she does not use drugs.  Allergies:  Allergies  Allergen Reactions   Bee Pollen    Food Hives    Seafood allergy: hives and itching   Shellfish Allergy    Vit D-Vit E-Safflower Oil    Vitamin D Analogs     Medications Prior to Admission  Medication Sig Dispense Refill   amLODipine (NORVASC) 10 MG tablet Take 1 tablet (10 mg total) by mouth daily. 90 tablet 3   cyclobenzaprine (FLEXERIL) 10 MG tablet TAKE 1 TABLET BY MOUTH AT BEDTIME AS NEEDED FOR MUSCLE SPASMS. 30 tablet 0   ibuprofen (ADVIL) 400 MG tablet Take 1.5 tablets (600 mg total) by mouth every 8 (eight) hours as needed.     lisinopril (ZESTRIL)  30 MG tablet Take 1 tablet (30 mg total) by mouth daily. 90 tablet 3   omeprazole (PRILOSEC) 20 MG capsule Take 1 capsule (20 mg total) by mouth daily. 30 capsule 0   oxyCODONE-acetaminophen (PERCOCET/ROXICET) 5-325 MG tablet Take 1 tablet by mouth every 6 (six) hours as needed for severe pain. 15 tablet 0     Physical Exam: Blood pressure (!) 143/76, pulse 95, temperature 98.5 F (36.9 C), resp. rate 18, height 5\' 4"  (1.626 m), weight 90.7 kg, last menstrual period 11/10/2011, SpO2 97 %. General: pleasant, WD/WN female who is laying in bed in NAD HEENT: head is normocephalic, atraumatic.  Sclera are noninjected.  PERRL.  Ears and nose without any masses or lesions.  Mouth is pink and moist. Dentition fair Heart: regular, rate, and rhythm Lungs: CTAB, no  wheezes, rhonchi, or rales noted.  Respiratory effort nonlabored Abd:  Soft, ND, epigastric and RUQ ttp with +Murphy's sign. No masses, hernias, or organomegaly MS: no BUE or BLE edema Skin: warm and dry  Psych: A&Ox4 with an appropriate affect Neuro: cranial nerves grossly intact, normal speech, thought process intact, gait not assessed   Results for orders placed or performed during the hospital encounter of 02/18/23 (from the past 48 hour(s))  Lipase, blood     Status: None   Collection Time: 02/18/23 10:34 PM  Result Value Ref Range   Lipase 28 11 - 51 U/L    Comment: Performed at [facility], [address] [place], [place] [address]  Comprehensive metabolic panel     Status: Abnormal   Collection Time: 02/18/23 10:34 PM  Result Value Ref Range   Sodium 135 135 - 145 mmol/L   Potassium 3.3 (L) 3.5 - 5.1 mmol/L   Chloride 99 98 - 111 mmol/L   CO2 25 22 - 32 mmol/L   Glucose, Bld 170 (H) 70 - 99 mg/dL    Comment: Glucose reference range applies only to samples taken after fasting for at least 8 hours.   BUN 16 6 - 20 mg/dL   Creatinine, Ser 7.82 0.44 - 1.00 mg/dL   Calcium 8.9 8.9 - 95.6 mg/dL   Total Protein 7.9 6.5 - 8.1 g/dL   Albumin 4.2 3.5 - 5.0 g/dL   AST 21 15 - 41 U/L   ALT 15 0 - 44 U/L   Alkaline Phosphatase 57 38 - 126 U/L   Total Bilirubin 0.5 0.3 - 1.2 mg/dL   GFR, Estimated >21 >30 mL/min    Comment: (NOTE) Calculated using the CKD-EPI Creatinine Equation (2021)    Anion gap 11 5 - 15    Comment: Performed at [facility], [address] [place], [place] [address]  CBC     Status: Abnormal   Collection Time: 02/18/23 10:34 PM  Result Value Ref Range   WBC 12.7 (H) 4.0 - 10.5 K/uL   RBC 4.89 3.87 - 5.11 MIL/uL   Hemoglobin 12.9 12.0 - 15.0 g/dL   HCT 46.9 62.9 - 52.8 %   MCV 82.4 80.0 - 100.0 fL   MCH 26.4 26.0 - 34.0 pg   MCHC 32.0 30.0 - 36.0 g/dL   RDW 41.3 24.4 - 01.0 %   Platelets 271 150 - 400 K/uL   nRBC 0.0 0.0 - 0.2  %    Comment: Performed at [facility] [address] [place], [place] [address]  Urinalysis, Routine w reflex microscopic -Urine, Clean Catch  Status: Abnormal   Collection Time: 02/18/23 10:34 PM  Result Value Ref Range   Color, Urine YELLOW YELLOW   APPearance CLEAR CLEAR   Specific Gravity, Urine >=1.030 1.005 - 1.030   pH 6.0 5.0 - 8.0   Glucose, UA NEGATIVE NEGATIVE mg/dL   Hgb urine dipstick NEGATIVE NEGATIVE   Bilirubin Urine NEGATIVE NEGATIVE   Ketones, ur 15 (A) NEGATIVE mg/dL   Protein, ur NEGATIVE NEGATIVE mg/dL   Nitrite NEGATIVE NEGATIVE   Leukocytes,Ua NEGATIVE NEGATIVE    Comment: Microscopic not done on urines with negative protein, blood, leukocytes, nitrite, or glucose < 500 mg/dL. Performed at [facility], [address] [place], [place] [address]   Comprehensive metabolic panel     Status: Abnormal   Collection Time: 02/19/23  3:58 AM  Result Value Ref Range   Sodium 134 (L) 135 - 145 mmol/L   Potassium 3.5 3.5 - 5.1 mmol/L   Chloride 100 98 - 111 mmol/L   CO2 25 22 - 32 mmol/L   Glucose, Bld 163 (H) 70 - 99 mg/dL    Comment: Glucose reference range applies only to samples taken after fasting for at least 8 hours.   BUN 12 6 - 20 mg/dL   Creatinine, Ser 6.04 0.44 - 1.00 mg/dL   Calcium 8.4 (L) 8.9 - 10.3 mg/dL   Total Protein 7.6 6.5 - 8.1 g/dL   Albumin 4.2 3.5 - 5.0 g/dL   AST 19 15 - 41 U/L   ALT 16 0 - 44 U/L   Alkaline Phosphatase 57 38 - 126 U/L   Total Bilirubin 0.5 0.3 - 1.2 mg/dL   GFR, Estimated >54 >09 mL/min    Comment: (NOTE) Calculated using the CKD-EPI Creatinine Equation (2021)    Anion gap 9 5 - 15    Comment: Performed at [facility], [address] [place], [place] [address]   No results found.  Anti-infectives (From admission, onward)    Start     Dose/Rate Route Frequency Ordered Stop   02/19/23 0400  cefTRIAXone (ROCEPHIN) 2 g in sodium chloride 0.9 % 100 mL IVPB        2  g 200 mL/hr over 30 Minutes Intravenous Every 24 hours 02/19/23 0348 02/26/23 0359       Assessment/Plan Acute Cholecystitis  Patients history, exam and workup consistent with Acute Cholecystitis. Prior RUQ [place] 5d ago w/ gallstone in the gallbladder neck measuring 1.4 x 0.8 x 2.4cm with gallbladder wall thickening and pericholecystic fluid. Today she has ongoing RUQ pain and leukocytosis of 12.7. Lipase and LFT's wnl. Recommend abx and Laparoscopic Cholecystectomy. I have explained the procedure, risks, and aftercare of Laparoscopic cholecystectomy with possible IOC.  Risks include but are not limited to anesthesia (MI, CVA, death, prolonged intubation and aspiration), bleeding, infection, wound problems, hernia, bile leak, injury to common bile duct/liver/intestine, possible need for subtotal cholecystectomy or open cholecystectomy, increased risk of DVT/PE and diarrhea post op.  She seems to understand and agrees to proceed.  I will confirm with MD okay to proceed with OR without repeat imaging.   FEN - NPO for OR. IVF VTE - SCDs, Lovenox ID - Rocephin Foley - None Dispo - Admit to observation.   HTN - PRN meds for now. Home meds when taking PO  I reviewed nursing notes, ED provider notes, last 24 h vitals and pain scores, last 48 h intake and output, last 24 h labs and trends, and last 24  h imaging results.  Jacinto Halim, [facility] 02/19/2023, 7:27 AM Please see Amion for pager number during day hours 7:00am-4:30pm

## 2023-02-19 NOTE — Plan of Care (Signed)
  Problem: Activity: Goal: Risk for activity intolerance will decrease Outcome: Progressing   Problem: Pain Managment: Goal: General experience of comfort will improve Outcome: Progressing   Problem: Safety: Goal: Ability to remain free from injury will improve Outcome: Progressing   Problem: Nutrition: Goal: Adequate nutrition will be maintained Outcome: Progressing   

## 2023-02-19 NOTE — ED Notes (Signed)
[facility] called for transport @02 :35 am

## 2023-02-19 NOTE — Anesthesia Postprocedure Evaluation (Signed)
Anesthesia Post Note  Patient: Tanya Carr  Procedure(s) Performed: LAPAROSCOPIC CHOLECYSTECTOMY     Patient location during evaluation: PACU Anesthesia Type: General Level of consciousness: sedated and patient cooperative Pain management: pain level controlled Vital Signs Assessment: post-procedure vital signs reviewed and stable Respiratory status: spontaneous breathing Cardiovascular status: stable Anesthetic complications: no   No notable events documented.  Last Vitals:  Vitals:   02/19/23 1455 02/19/23 1514  BP:  120/76  Pulse: 66 85  Resp: 19 16  Temp:  36.6 C  SpO2: 95% 95%    Last Pain:  Vitals:   02/19/23 1514  TempSrc: Oral  PainSc:                  Lewie Loron

## 2023-02-19 NOTE — ED Notes (Signed)
ED TO INPATIENT HANDOFF REPORT  ED Nurse Name and Phone #: Dortha Schwalbe, [phone]  S Name/Age/Gender Tanya Carr 52 y.o. female Room/Bed: MH10/MH10  Code Status   Code Status: Prior  Home/SNF/Other Home Patient oriented to: self, place, time, and situation Is this baseline? Yes   Triage Complete: Triage complete  Chief Complaint Acute cholecystitis [K81.0]  Triage Note Patient arrived via POV c/o abdominal pain x 10hrs pta. Patient seen recently w/ dx of billary colic. Patient states pain starting after lunch, increasing during evening. Patient states new nausea and vomiting. Patient states 3 episodes of emesis. Patient is AO x 4, VS WDL, normal gait.   Allergies Allergies  Allergen Reactions   Bee Pollen    Food Hives    Seafood allergy: hives and itching   Shellfish Allergy    Vit D-Vit E-Safflower Oil    Vitamin D Analogs     Level of Care/Admitting Diagnosis ED Disposition     ED Disposition  Admit   Condition  --   Comment  Hospital Area: [facility] [[id number]]  Level of Care: Med-Surg [16]  Interfacility transfer: Yes  May place patient in observation at [facility] if equivalent level of care is available:: No  Covid Evaluation: Asymptomatic - no recent exposure (last 10 days) testing not required  Diagnosis: Acute cholecystitis [575.0.ICD-9-CM]  Admitting Physician: [facility] [[id number]]  Attending Physician: [facility] [[id number]]          B Medical/Surgery History Past Medical History:  Diagnosis Date   Allergy    seasonal   Fainting episodes 2011   history of with blood draw, mammogram, post op 2011   Headache(784.0)    history of migraines   Hypertension    on lisinopril-will do ekg today   Palpitations    PVC's   PONV (postoperative nausea and vomiting)    vagal response per pt   Past Surgical History:  Procedure Laterality Date   ABDOMINAL HYSTERECTOMY      LUMBAR DISC SURGERY     ulnar nerve decompression Bilateral 2011     A IV Location/Drains/Wounds Patient Lines/Drains/Airways Status     Active Line/Drains/Airways     Name Placement date Placement time Site Days   Peripheral IV 02/19/23 20 G Right Antecubital 02/19/23  0029  Antecubital  less than 1            Intake/Output Last 24 hours  Intake/Output Summary (Last 24 hours) at 02/19/2023 0233 Last data filed at 02/19/2023 0232 Gross per 24 hour  Intake 1000.62 ml  Output --  Net 1000.62 ml    Labs/Imaging Results for orders placed or performed during the hospital encounter of 02/18/23 (from the past 48 hour(s))  Lipase, blood     Status: None   Collection Time: 02/18/23 10:34 PM  Result Value Ref Range   Lipase 28 11 - 51 U/L    Comment: Performed at [facility], [address] [place], [place] [address]  Comprehensive metabolic panel     Status: Abnormal   Collection Time: 02/18/23 10:34 PM  Result Value Ref Range   Sodium 135 135 - 145 mmol/L   Potassium 3.3 (L) 3.5 - 5.1 mmol/L   Chloride 99 98 - 111 mmol/L   CO2 25 22 - 32 mmol/L   Glucose, Bld 170 (H) 70 - 99 mg/dL    Comment: Glucose reference range applies only to samples taken after fasting for  at least 8 hours.   BUN 16 6 - 20 mg/dL   Creatinine, Ser 1.61 0.44 - 1.00 mg/dL   Calcium 8.9 8.9 - 09.6 mg/dL   Total Protein 7.9 6.5 - 8.1 g/dL   Albumin 4.2 3.5 - 5.0 g/dL   AST 21 15 - 41 U/L   ALT 15 0 - 44 U/L   Alkaline Phosphatase 57 38 - 126 U/L   Total Bilirubin 0.5 0.3 - 1.2 mg/dL   GFR, Estimated >04 >54 mL/min    Comment: (NOTE) Calculated using the CKD-EPI Creatinine Equation (2021)    Anion gap 11 5 - 15    Comment: Performed at [facility], [address] [place], [place] [address]  CBC     Status: Abnormal   Collection Time: 02/18/23 10:34 PM  Result Value Ref Range   WBC 12.7 (H) 4.0 - 10.5 K/uL   RBC 4.89 3.87 - 5.11 MIL/uL   Hemoglobin 12.9 12.0 - 15.0 g/dL    HCT 91.4 78.2 - 95.6 %   MCV 82.4 80.0 - 100.0 fL   MCH 26.4 26.0 - 34.0 pg   MCHC 32.0 30.0 - 36.0 g/dL   RDW 21.3 08.6 - 57.8 %   Platelets 271 150 - 400 K/uL   nRBC 0.0 0.0 - 0.2 %    Comment: Performed at [facility], [address] [place], [place] [address]  Urinalysis, Routine w reflex microscopic -Urine, Clean Catch     Status: Abnormal   Collection Time: 02/18/23 10:34 PM  Result Value Ref Range   Color, Urine YELLOW YELLOW   APPearance CLEAR CLEAR   Specific Gravity, Urine >=1.030 1.005 - 1.030   pH 6.0 5.0 - 8.0   Glucose, UA NEGATIVE NEGATIVE mg/dL   Hgb urine dipstick NEGATIVE NEGATIVE   Bilirubin Urine NEGATIVE NEGATIVE   Ketones, ur 15 (A) NEGATIVE mg/dL   Protein, ur NEGATIVE NEGATIVE mg/dL   Nitrite NEGATIVE NEGATIVE   Leukocytes,Ua NEGATIVE NEGATIVE    Comment: Microscopic not done on urines with negative protein, blood, leukocytes, nitrite, or glucose < 500 mg/dL. Performed at [facility], [address] [place], [place] [address]    No results found.  Pending Labs [employer] (From admission, onward)     Start     Ordered   Signed and Held  HIV Antibody (routine testing w rflx)  (HIV Antibody (Routine testing w reflex) panel)  Once,   R        Signed and Held   Signed and Held  Comprehensive metabolic panel  Tomorrow morning,   R        Signed and Held            Vitals/Pain Today's Vitals   02/18/23 2232 02/18/23 2232 02/19/23 0110 02/19/23 0232  BP:  131/68  127/65  Pulse:  97  91  Resp:  20  20  Temp:  99 F (37.2 C)  99 F (37.2 C)  TempSrc:    Oral  SpO2:  100%  99%  Weight: 90.7 kg     Height: 5\' 4"  (1.626 m)     PainSc: 9   6  4      Isolation Precautions No active isolations  Medications Medications  ondansetron (ZOFRAN) injection 4 mg (4 mg Intravenous Given 02/19/23 0031)  HYDROmorphone (DILAUDID) injection 1 mg (1 mg Intravenous Given 02/19/23 0031)  sodium chloride 0.9 % bolus 1,000 mL (  Intravenous  Stopped 02/19/23 0132)    Mobility walks     Focused Assessments Nausea + 3 episodes of emesis prior to arrival. RUQ pain.   R Recommendations: See Admitting Provider Note  Report given to:   Additional Notes: N/A

## 2023-02-19 NOTE — Op Note (Signed)
Laparoscopic Cholecystectomy Procedure Note  Indications: This patient presents with symptomatic gallbladder disease and will undergo laparoscopic cholecystectomy.  Pre-operative Diagnosis: acute cholecystitis with cholelithiasis  Post-operative Diagnosis: acute cholecystitis with choleltihiasis  Surgeon: Abigail Miyamoto   Assistants: none  Anesthesia: General endotracheal anesthesia  ASA Class: 2  Procedure Details  The patient was seen again in the Holding Room. The risks, benefits, complications, treatment options, and expected outcomes were discussed with the patient. The possibilities of reaction to medication, pulmonary aspiration, perforation of viscus, bleeding, recurrent infection, finding a normal gallbladder, the need for additional procedures, failure to diagnose a condition, the possible need to convert to an open procedure, and creating a complication requiring transfusion or operation were discussed with the patient. The likelihood of improving the patient's symptoms with return to their baseline status is good.  The patient and/or family concurred with the proposed plan, giving informed consent. The site of surgery properly noted. The patient was taken to Operating Room, identified as Toney Rakes and the procedure verified as Laparoscopic Cholecystectomy with Intraoperative Cholangiogram. A Time Out was held and the above information confirmed.  Prior to the induction of general anesthesia, antibiotic prophylaxis was administered. General endotracheal anesthesia was then administered and tolerated well. After the induction, the abdomen was prepped with Chloraprep and draped in sterile fashion. The patient was positioned in the supine position.  Local anesthetic agent was injected into the skin near the umbilicus and an incision made. We dissected down to the abdominal fascia with blunt dissection.  The fascia was incised vertically and we entered the peritoneal cavity  bluntly.  A pursestring suture of 0-Vicryl was placed around the fascial opening.  The Hasson cannula was inserted and secured with the stay suture.  Pneumoperitoneum was then created with CO2 and tolerated well without any adverse changes in the patient's vital signs. A 5-mm port was placed in the subxiphoid position.  Two 5-mm ports were placed in the right upper quadrant. All skin incisions were infiltrated with a local anesthetic agent before making the incision and placing the trocars.   We positioned the patient in reverse Trendelenburg, tilted slightly to the patient's left.  The gallbladder was identified and found to be distended and acutely inflamed with gangrenous changes.  I had to aspirate bile from the gallbladder in order to grasp it as it was so distended.  The fundus was then grasped and retracted cephalad. Adhesions were lysed bluntly and with the electrocautery where indicated, taking care not to injure any adjacent organs or viscus.  There is a very large stone at the base of the gallbladder.  The infundibulum was grasped and retracted laterally, exposing the peritoneum overlying the triangle of Calot. This was then divided and exposed in a blunt fashion. The cystic duct was clearly identified and bluntly dissected circumferentially. A critical view of the cystic duct and cystic artery was obtained.  The cystic duct was then ligated with clips 3 times proximally and once distally and divided. The cystic artery was, dissected free, ligated with clips and divided as well.    The gallbladder was dissected from the liver bed in retrograde fashion with the electrocautery.  I achieved hemostasis in the liver bed with cautery and a piece of surgical snow the gallbladder was removed and placed in an Endocatch sac. The liver bed was irrigated and inspected. Hemostasis was achieved with the electrocautery. Copious irrigation was utilized and was repeatedly aspirated until clear.  The gallbladder and  Endocatch sac were  then removed through the umbilical port site.  The pursestring suture was used to close the umbilical fascia.    We again inspected the right upper quadrant for hemostasis.  Pneumoperitoneum was released as we removed the trocars.  4-0 Monocryl was used to close the skin.   Dermabond was applied. The patient was then extubated and brought to the recovery room in stable condition. Instrument, sponge, and needle counts were correct at closure and at the conclusion of the case.   Findings: Cholecystitis with Cholelithiasis Gangrenous changes of the gallbladder  Estimated Blood Loss: less than 50 mL         Drains: none         Specimens: Gallbladder           Complications: None; patient tolerated the procedure well.         Disposition: PACU - hemodynamically stable.         Condition: stable

## 2023-02-19 NOTE — Transfer of Care (Signed)
Immediate Anesthesia Transfer of Care Note  Patient: Tanya Carr  Procedure(s) Performed: LAPAROSCOPIC CHOLECYSTECTOMY  Patient Location: PACU  Anesthesia Type:General  Level of Consciousness: awake and patient cooperative  Airway & Oxygen Therapy: Patient Spontanous Breathing and Patient connected to face mask  Post-op Assessment: Report given to RN and Post -op Vital signs reviewed and stable  Post vital signs: Reviewed and stable  Last Vitals:  Vitals Value Taken Time  BP 127/68 02/19/23 1354  Temp    Pulse 83 02/19/23 1357  Resp 21 02/19/23 1357  SpO2 99 % 02/19/23 1357  Vitals shown include unvalidated device data.  Last Pain:  Vitals:   02/19/23 1208  TempSrc:   PainSc: 3          Complications: No notable events documented.

## 2023-02-19 NOTE — Discharge Instructions (Signed)
[facility]  Please arrive at least 30 min before your appointment to complete your check in paperwork.  If you are unable to arrive 30 min prior to your appointment time we may have to cancel or reschedule you. LAPAROSCOPIC SURGERY: POST OP INSTRUCTIONS Always review your discharge instruction sheet given to you by the facility where your surgery was performed. IF YOU HAVE DISABILITY OR FAMILY LEAVE FORMS, YOU MUST BRING THEM TO THE OFFICE FOR PROCESSING.   DO NOT GIVE THEM TO YOUR DOCTOR.  PAIN CONTROL  First take acetaminophen (Tylenol) AND/or ibuprofen (Advil) to control your pain after surgery.  Follow directions on package.  Taking acetaminophen (Tylenol) and/or ibuprofen (Advil) regularly after surgery will help to control your pain and lower the amount of prescription pain medication you may need.  You should not take more than 4,000 mg (4 grams) of acetaminophen (Tylenol) in 24 hours.  You should not take ibuprofen (Advil), aleve, motrin, naprosyn or other NSAIDS if you have a history of stomach ulcers or chronic kidney disease.  A prescription for pain medication may be given to you upon discharge.  Take your pain medication as prescribed, if you still have uncontrolled pain after taking acetaminophen (Tylenol) or ibuprofen (Advil). Use ice packs to help control pain. If you need a refill on your pain medication, please contact your pharmacy.  They will contact our office to request authorization. Prescriptions will not be filled after 5pm or on week-ends.  HOME MEDICATIONS Take your usually prescribed medications unless otherwise directed.  DIET You should follow a light diet the first few days after arrival home.  Be sure to include lots of fluids daily. Avoid fatty, fried foods.   CONSTIPATION It is common to experience some constipation after surgery and if you are taking pain medication.  Increasing fluid intake and taking a stool softener (such as Colace)  will usually help or prevent this problem from occurring.  A mild laxative (Milk of Magnesia or Miralax) should be taken according to package instructions if there are no bowel movements after 48 hours.  WOUND/INCISION CARE Most patients will experience some swelling and bruising in the area of the incisions.  Ice packs will help.  Swelling and bruising can take several days to resolve.  Unless discharge instructions indicate otherwise, follow guidelines below  STERI-STRIPS - you may remove your outer bandages 48 hours after surgery, and you may shower at that time.  You have steri-strips (small skin tapes) in place directly over the incision.  These strips should be left on the skin for 7-10 days.   DERMABOND/SKIN GLUE - you may shower in 24 hours.  The glue will flake off over the next 2-3 weeks. Any sutures or staples will be removed at the office during your follow-up visit.  ACTIVITIES You may resume regular (light) daily activities beginning the next day--such as daily self-care, walking, climbing stairs--gradually increasing activities as tolerated.  You may have sexual intercourse when it is comfortable.  Refrain from any heavy lifting or straining until approved by your doctor. You may drive when you are no longer taking prescription pain medication, you can comfortably wear a seatbelt, and you can safely maneuver your car and apply brakes.  FOLLOW-UP You should see your doctor in the office for a follow-up appointment approximately 2-3 weeks after your surgery.  You should have been given your post-op/follow-up appointment when your surgery was scheduled.  If you did not receive a post-op/follow-up appointment, make sure   that you call for this appointment within a day or two after you arrive home to insure a convenient appointment time.   WHEN TO CALL YOUR DOCTOR: Fever over 101.0 Inability to urinate Continued bleeding from incision. Increased pain, redness, or drainage from the  incision. Increasing abdominal pain  The clinic staff is available to answer your questions during regular business hours.  Please don't hesitate to call and ask to speak to one of the nurses for clinical concerns.  If you have a medical emergency, go to the nearest emergency room or call 911.  A surgeon from [facility] is always on call at the hospital. [address], [address], [place], [place]  [address] ? [address] [address], , [place]   [address] [phone] ? [phone] ? [phone]  

## 2023-02-19 NOTE — Progress Notes (Signed)
   02/19/23 1030  TOC Brief Assessment  Insurance and Status Reviewed  Patient has primary care physician Yes  Home environment has been reviewed home with spouse  Prior level of function: independent  Prior/Current Home Services No current home services  Social Determinants of Health Reivew SDOH reviewed no interventions necessary  Readmission risk has been reviewed Yes  Transition of care needs no transition of care needs at this time

## 2023-02-19 NOTE — Anesthesia Preprocedure Evaluation (Signed)
Anesthesia Evaluation  Patient identified by MRN, date of birth, ID band Patient awake    Reviewed: Allergy & Precautions, H&P , Patient's Chart, lab work & pertinent test results, reviewed documented beta blocker date and time   History of Anesthesia Complications (+) PONV and history of anesthetic complications  Airway Mallampati: II  TM Distance: >3 FB Neck ROM: full    Dental no notable dental hx.    Pulmonary sleep apnea , former smoker   Pulmonary exam normal breath sounds clear to auscultation       Cardiovascular Exercise Tolerance: Good hypertension, Pt. on medications negative cardio ROS  Rhythm:regular Rate:Normal     Neuro/Psych  Headaches  negative psych ROS   GI/Hepatic negative GI ROS, Neg liver ROS,,,  Endo/Other  negative endocrine ROS    Renal/GU negative Renal ROS     Musculoskeletal  (+) Arthritis , Osteoarthritis,    Abdominal  (+) + obese  Peds  Hematology negative hematology ROS (+)   Anesthesia Other Findings PONV (postoperative nausea and vomiting)   vagal response per pt Hypertension   on lisinopril-  Headache   history of migraines Fainting episodes 2011 history of with blood draw, mammogram, post op 2011       Reproductive/Obstetrics negative OB ROS                             Anesthesia Physical Anesthesia Plan  ASA: II  Anesthesia Plan: General ETT   Post-op Pain Management: Dilaudid IV   Induction: Intravenous  PONV Risk Score and Plan: 4 or greater and Ondansetron, Dexamethasone, Midazolam, Droperidol and Treatment may vary due to age or medical condition  Airway Management Planned: Oral ETT  Additional Equipment:   Intra-op Plan:   Post-operative Plan: Extubation in OR  Informed Consent: I have reviewed the patients History and Physical, chart, labs and discussed the procedure including the risks, benefits and alternatives for the proposed  anesthesia with the patient or authorized representative who has indicated his/her understanding and acceptance.     Dental Advisory Given  Plan Discussed with: CRNA, Surgeon and Anesthesiologist  Anesthesia Plan Comments:         Anesthesia Quick Evaluation

## 2023-02-19 NOTE — Anesthesia Procedure Notes (Signed)
Procedure Name: Intubation Date/Time: 02/19/2023 12:54 PM  Performed by: Vanessa Grand [place], CRNAPre-anesthesia Checklist: Patient identified, Emergency Drugs available, Suction available and Patient being monitored Patient Re-evaluated:Patient Re-evaluated prior to induction Oxygen Delivery Method: Circle system utilized Preoxygenation: Pre-oxygenation with 100% oxygen Induction Type: IV induction Ventilation: Mask ventilation without difficulty Laryngoscope Size: 2 and Miller Grade View: Grade II Tube type: Oral Tube size: 7.0 mm Number of attempts: 1 Airway Equipment and Method: Stylet Placement Confirmation: ETT inserted through vocal cords under direct vision, positive ETCO2 and breath sounds checked- equal and bilateral Secured at: 21 cm Tube secured with: Tape Dental Injury: Teeth and Oropharynx as per pre-operative assessment

## 2023-02-19 NOTE — ED Provider Notes (Signed)
[facility] AT [facility] Provider Note   CSN: [id number] Arrival date & time: 02/18/23  2225     History  Chief Complaint  Patient presents with   Abdominal Pain    Tanya Carr is a 52 y.o. female.  Patient is a 52 year old female with past medical history of hypertension and GERD.  Patient presenting today with complaints of right upper quadrant and epigastric pain.  This has been worsening over the past 5 days.  Patient was seen here on Sunday with similar complaints.  She had an ultrasound showing some thickening of the gallbladder wall with pericholecystic fluid consistent with possible acute cholecystitis.  Patient was discharged with general surgery follow-up.  She returns today with worsening pain, vomiting, and an inability to eat.  No fevers or chills.  The history is provided by the patient.       Home Medications Prior to Admission medications   Medication Sig Start Date End Date Taking? Authorizing Provider  amLODipine (NORVASC) 10 MG tablet Take 1 tablet (10 mg total) by mouth daily. 06/04/22   Nche, Bonna Gains, NP  cyclobenzaprine (FLEXERIL) 10 MG tablet TAKE 1 TABLET BY MOUTH AT BEDTIME AS NEEDED FOR MUSCLE SPASMS. 02/07/22   Nche, Bonna Gains, NP  ibuprofen (ADVIL) 400 MG tablet Take 1.5 tablets (600 mg total) by mouth every 8 (eight) hours as needed. 11/09/20   Nche, Bonna Gains, NP  lisinopril (ZESTRIL) 30 MG tablet Take 1 tablet (30 mg total) by mouth daily. 06/04/22   Nche, Bonna Gains, NP  omeprazole (PRILOSEC) 20 MG capsule Take 1 capsule (20 mg total) by mouth daily. 06/04/22   Nche, Bonna Gains, NP  oxyCODONE-acetaminophen (PERCOCET/ROXICET) 5-325 MG tablet Take 1 tablet by mouth every 6 (six) hours as needed for severe pain. 02/14/23   Smitty Knudsen, PA-C      Allergies    Bee pollen, Food, Shellfish allergy, Vit d-vit e-safflower oil, and Vitamin d analogs    Review of Systems   Review of Systems  All  other systems reviewed and are negative.   Physical Exam Updated Vital Signs BP 131/68 (BP Location: Left Arm)   Pulse 97   Temp 99 F (37.2 C)   Resp 20   Ht 5\' 4"  (1.626 m)   Wt 90.7 kg   LMP 11/10/2011   SpO2 100%   BMI 34.33 kg/m  Physical Exam Vitals and nursing note reviewed.  Constitutional:      General: She is not in acute distress.    Appearance: She is well-developed. She is not diaphoretic.  HENT:     Head: Normocephalic and atraumatic.  Cardiovascular:     Rate and Rhythm: Normal rate and regular rhythm.     Heart sounds: No murmur heard.    No friction rub. No gallop.  Pulmonary:     Effort: Pulmonary effort is normal. No respiratory distress.     Breath sounds: Normal breath sounds. No wheezing.  Abdominal:     General: Bowel sounds are normal. There is no distension.     Palpations: Abdomen is soft.     Tenderness: There is abdominal tenderness in the right upper quadrant and epigastric area. There is no right CVA tenderness, left CVA tenderness, guarding or rebound.  Musculoskeletal:        General: Normal range of motion.     Cervical back: Normal range of motion and neck supple.  Skin:    General: Skin is warm and  dry.  Neurological:     General: No focal deficit present.     Mental Status: She is alert and oriented to person, place, and time.     ED Results / Procedures / Treatments   Labs (all labs ordered are listed, but only abnormal results are displayed) Labs Reviewed  COMPREHENSIVE METABOLIC PANEL - Abnormal; Notable for the following components:      Result Value   Potassium 3.3 (*)    Glucose, Bld 170 (*)    All other components within normal limits  CBC - Abnormal; Notable for the following components:   WBC 12.7 (*)    All other components within normal limits  URINALYSIS, ROUTINE W REFLEX MICROSCOPIC - Abnormal; Notable for the following components:   Ketones, ur 15 (*)    All other components within normal limits  LIPASE,  BLOOD    EKG None  Radiology No results found.  Procedures Procedures    Medications Ordered in ED Medications  ondansetron (ZOFRAN) injection 4 mg (has no administration in time range)  HYDROmorphone (DILAUDID) injection 1 mg (has no administration in time range)  sodium chloride 0.9 % bolus 1,000 mL (has no administration in time range)    ED Course/ Medical Decision Making/ A&P  Patient with complaints of abdominal pain for the past several days.  She was seen here 3 days ago and diagnosed with a gallstone with possible early cholecystitis.  She was discharged with surgery follow-up, but pain returned and worsened.  Patient arrives here uncomfortable, but afebrile with stable vital signs.  Workup initiated including CBC, CMP, and lipase.  She has a slight leukocytosis with white count of 12.7, but labs are otherwise unremarkable.  Patient was given morphine followed by Dilaudid for pain and Zofran for nausea and is feeling somewhat better.  Care discussed with Dr. Luisa Hart from general surgery.  Patient to be admitted to [facility] for likely cholecystectomy.  Final Clinical Impression(s) / ED Diagnoses Final diagnoses:  None    Rx / DC Orders ED Discharge Orders     None         Geoffery Lyons, MD 02/19/23 [phone]

## 2023-02-20 ENCOUNTER — Encounter (HOSPITAL_COMMUNITY): Payer: Self-pay | Admitting: Surgery

## 2023-02-20 LAB — CBC
HCT: 41 % (ref 36.0–46.0)
Hemoglobin: 12.7 g/dL (ref 12.0–15.0)
MCH: 26.5 pg (ref 26.0–34.0)
MCHC: 31 g/dL (ref 30.0–36.0)
MCV: 85.6 fL (ref 80.0–100.0)
Platelets: 180 10*3/uL (ref 150–400)
RBC: 4.79 MIL/uL (ref 3.87–5.11)
RDW: 14 % (ref 11.5–15.5)
WBC: 14 10*3/uL — ABNORMAL HIGH (ref 4.0–10.5)
nRBC: 0 % (ref 0.0–0.2)

## 2023-02-20 LAB — COMPREHENSIVE METABOLIC PANEL
ALT: 43 U/L (ref 0–44)
AST: 52 U/L — ABNORMAL HIGH (ref 15–41)
Albumin: 3.6 g/dL (ref 3.5–5.0)
Alkaline Phosphatase: 47 U/L (ref 38–126)
Anion gap: 9 (ref 5–15)
BUN: 13 mg/dL (ref 6–20)
CO2: 23 mmol/L (ref 22–32)
Calcium: 8.5 mg/dL — ABNORMAL LOW (ref 8.9–10.3)
Chloride: 105 mmol/L (ref 98–111)
Creatinine, Ser: 0.83 mg/dL (ref 0.44–1.00)
GFR, Estimated: >60 mL/min (ref >60)
Glucose, Bld: 131 mg/dL — ABNORMAL HIGH (ref 70–99)
Potassium: 3.6 mmol/L (ref 3.5–5.1)
Sodium: 137 mmol/L (ref 135–145)
Total Bilirubin: 0.8 mg/dL (ref 0.3–1.2)
Total Protein: 7.2 g/dL (ref 6.5–8.1)

## 2023-02-20 LAB — SURGICAL PATHOLOGY

## 2023-02-20 MED ORDER — AMOXICILLIN-POT CLAVULANATE 875-125 MG PO TABS: 1.0000 | ORAL_TABLET | Freq: Two times a day (BID) | ORAL | 0 refills | Status: DC

## 2023-02-20 MED ORDER — ONDANSETRON HCL 4 MG PO TABS: 4.0000 mg | ORAL_TABLET | Freq: Every day | ORAL | 0 refills | Status: AC | PRN

## 2023-02-20 MED ORDER — METHOCARBAMOL 500 MG PO TABS: 500.0000 mg | ORAL_TABLET | Freq: Four times a day (QID) | ORAL | 0 refills | Status: DC | PRN

## 2023-02-20 NOTE — Progress Notes (Signed)
Pt tolerated regular diet even though she reported food did not taste too good. Discharged instructions given with husband at bedside. No concerns voiced. Encouraged to stop by [facility] pharmacy and pick up discharge meds that were e-prescribed by Provider. Verbalized understanding. Left unit in wheelchair pushed by nurse tech. Left in stable condition.

## 2023-02-20 NOTE — Discharge Summary (Signed)
Patient ID: Tanya Carr [id number] 1971/07/02 52 y.o.  Admit date: 02/18/2023 Discharge date: 02/20/2023   Discharge Diagnosis Gangrenous Cholecystitis s/p laparoscopic cholecystectomy by Dr. Magnus Ivan on 6/20  Consultants None  H&P Tanya Carr is a 52 y.o. female with a hx of HTN and GERD who presented to [facility] ED w/ abdominal pain. Patient seen for similar complaint on 6/15. W/u at that time with gallstone in the gallbladder neck measuring 1.4 x 0.8 x 2.4cm with gallbladder wall thickening and pericholecystic fluid. CBD wnl at 4mm. WBC, Lipase and LFT's wnl. She was d/c home with pain medication and referral to [facility]. Presented with ongoing RUQ pain x 4-5 days after po intake. Over the last 24 hours the pain has become constant, radiates to her back and associated with n/v. Pain unrelieved by percocet at home. No fever, constipation, diarrhea or urinary symptoms.    Here she was afebrile without tachycardia or hypotension. WBC 12.7. LFTs and Lipase wnl. No repeat US was done. Patient was started on Rocephin and transferred to [facility] for evaluation.    Prior Abdominal Surgeries: Laparoscopic Cholecystectomy  Blood Thinners: None Last PO intake: 6/19 Tobacco Use: None Alcohol Use: None Substance use: None  Employment: Emergency planning/management officer  Lives at home with her fiance    Denies a history of MI, CVA, asthma, or COPD. At baseline states he mobilizes without an assistive device and can climb a flight of stairs without stopping due to DOE or chest pain.  Procedures Dr. Magnus Ivan - 02/19/23 - Laparoscopic Cholecystectomy  Hospital Course:  The patient was admitted and underwent a laparoscopic cholecystectomy. The patient tolerated the procedure well.  On POD 1, the patient was tolerating a diet, voiding well, mobilizing, and pain was controlled with oral pain medications.  The patient was stable for DC home at this time with appropriate follow up made. Discussed discharge  instructions, restrictions and return/call back precautions. Patient reports she has Percocet still from the ED and does not want an Rx of Oxy sent to her pharmacy. She denied a need for work note at d/c. She was sent with 5d of abx given gangrenous changes of the GB noted intra-op.   Physical Exam: Gen:  Alert, NAD, pleasant Card:  RRR Pulm:  CTAB, no W/R/R, effort normal Abd: Soft, no distension, appropriately tender around laparoscopic incisions, no rigidity or guarding and otherwise NT, +BS. Incisions with glue intact appears well and are without drainage, bleeding, or signs of infection  Psych: A&Ox3   Allergies as of 02/20/2023       Reactions   Bee Pollen    Food Hives   Seafood allergy: hives and itching   Shellfish Allergy    Vit D-vit E-safflower Oil    Vitamin D Analogs         Medication List     STOP taking these medications    cyclobenzaprine 10 MG tablet Commonly known as: FLEXERIL       TAKE these medications    amLODipine 10 MG tablet Commonly known as: NORVASC Take 1 tablet (10 mg total) by mouth daily.   ibuprofen 400 MG tablet Commonly known as: ADVIL Take 1.5 tablets (600 mg total) by mouth every 8 (eight) hours as needed.   lisinopril 30 MG tablet Commonly known as: ZESTRIL Take 1 tablet (30 mg total) by mouth daily.   methocarbamol 500 MG tablet Commonly known as: ROBAXIN Take 1 tablet (500 mg total) by mouth every 6 (six) hours as  needed for muscle spasms.   omeprazole 20 MG capsule Commonly known as: PRILOSEC Take 1 capsule (20 mg total) by mouth daily.   ondansetron 4 MG tablet Commonly known as: Zofran Take 1 tablet (4 mg total) by mouth daily as needed for nausea or vomiting.   oxyCODONE-acetaminophen 5-325 MG tablet Commonly known as: PERCOCET/ROXICET Take 1 tablet by mouth every 6 (six) hours as needed for severe pain.          Follow-up Information     Rudd, Bertram Millard, MD Follow up.   Specialty: Family  Medicine Contact information: [address] [place] [address] [phone]         Saunders Glance McIntire, [place]. Go on 03/12/2023.   Specialty: General Surgery Why: 03/12/23 at 1:45 pm Please bring a copy of your photo ID, insurance card and arrive 30 minutes prior to your appointment for paperwork. Contact information: [address] [place] [address] [phone]                 Signed: Leary Roca, [facility] 02/20/2023, 10:00 AM Please see Amion for pager number during day hours 7:00am-4:30pm

## 2023-02-23 ENCOUNTER — Telehealth: Payer: Self-pay

## 2023-02-23 NOTE — Transitions of Care (Post Inpatient/ED Visit) (Signed)
   02/23/2023  Name: Tanya Carr MRN: [id number] DOB: Jul 28, 1971  Today's TOC FU Call Status: Today's TOC FU Call Status:: Successful TOC FU Call Competed TOC FU Call Complete Date: 02/23/23  Transition Care Management Follow-up Telephone Call Date of Discharge: 02/20/23 Discharge Facility: [facility]) Type of Discharge: Inpatient Admission Primary Inpatient Discharge Diagnosis:: Acute cholecystitis How have you been since you were released from the hospital?: Better Any questions or concerns?: No  Items Reviewed: Did you receive and understand the discharge instructions provided?: Yes Medications obtained,verified, and reconciled?: Yes (Medications Reviewed) Any new allergies since your discharge?: No Dietary orders reviewed?: Yes Do you have support at home?: Yes  Medications Reviewed Today: Medications Reviewed Today     Reviewed by Merleen Nicely, LPN (Licensed Practical Nurse) on 02/23/23 at 1120  Med List Status: <None>   Medication Order Taking? Sig Documenting Provider Last Dose Status Informant  amLODipine (NORVASC) 10 MG tablet [id number] Yes Take 1 tablet (10 mg total) by mouth daily. Nche, Bonna Gains, NP Taking Active Self, Pharmacy Records  amoxicillin-clavulanate (AUGMENTIN) 875-125 MG tablet [id number] Yes Take 1 tablet by mouth every 12 (twelve) hours. Maczis, Elmer Sow, PA-C Taking Active   ibuprofen (ADVIL) 400 MG tablet [id number] Yes Take 1.5 tablets (600 mg total) by mouth every 8 (eight) hours as needed. Nche, Bonna Gains, NP Taking Active Self, Pharmacy Records  lisinopril (ZESTRIL) 30 MG tablet [id number] Yes Take 1 tablet (30 mg total) by mouth daily. Nche, Bonna Gains, NP Taking Active Self, Pharmacy Records  methocarbamol (ROBAXIN) 500 MG tablet [id number] Yes Take 1 tablet (500 mg total) by mouth every 6 (six) hours as needed for muscle spasms. Maczis, Elmer Sow, PA-C Taking Active   omeprazole (PRILOSEC) 20 MG capsule [id number] Yes Take  1 capsule (20 mg total) by mouth daily. Anne Ng, NP Taking Active Self, Pharmacy Records  ondansetron [facility]) 4 MG tablet [id number] Yes Take 1 tablet (4 mg total) by mouth daily as needed for nausea or vomiting. Maczis, Elmer Sow, PA-C Taking Active   oxyCODONE-acetaminophen (PERCOCET/ROXICET) 5-325 MG tablet [id number] Yes Take 1 tablet by mouth every 6 (six) hours as needed for severe pain. Smitty Knudsen, PA-C Taking Active Self, Pharmacy Records            Home Care and Equipment/Supplies: Were Home Health Services Ordered?: No Any new equipment or medical supplies ordered?: No  Functional Questionnaire: Do you need assistance with bathing/showering or dressing?: No Do you need assistance with meal preparation?: No Do you need assistance with eating?: No Do you have difficulty maintaining continence: No Do you need assistance with getting out of bed/getting out of a chair/moving?: No Do you have difficulty managing or taking your medications?: No  Follow up appointments reviewed: PCP Follow-up appointment confirmed?: No MD Provider Line Number:[phone] Given: Yes Specialist Hospital Follow-up appointment confirmed?: Yes Date of Specialist follow-up appointment?: 03/12/23 Follow-Up Specialty Provider:: Dr Lavell Anchors Do you need transportation to your follow-up appointment?: No Do you understand care options if your condition(s) worsen?: Yes-patient verbalized understanding    SIGNATURE  Woodfin Ganja LPN [facility] Nurse Health Advisor Direct Dial [phone]

## 2023-03-03 ENCOUNTER — Ambulatory Visit: Payer: PRIVATE HEALTH INSURANCE | Admitting: Gastroenterology

## 2023-03-03 MED FILL — Fentanyl Citrate Preservative Free (PF) Inj 100 MCG/2ML: INTRAMUSCULAR | Qty: 1 | Status: AC

## 2023-04-18 ENCOUNTER — Other Ambulatory Visit: Payer: Self-pay | Admitting: Family Medicine

## 2023-04-18 DIAGNOSIS — I10 Essential (primary) hypertension: Secondary | ICD-10-CM

## 2023-05-14 ENCOUNTER — Other Ambulatory Visit: Payer: Self-pay | Admitting: Nurse Practitioner

## 2023-05-14 DIAGNOSIS — I10 Essential (primary) hypertension: Secondary | ICD-10-CM

## 2023-05-25 ENCOUNTER — Telehealth: Payer: Self-pay | Admitting: Family Medicine

## 2023-05-25 DIAGNOSIS — I10 Essential (primary) hypertension: Secondary | ICD-10-CM

## 2023-05-25 MED ORDER — LISINOPRIL 30 MG PO TABS
30.0000 mg | ORAL_TABLET | Freq: Every day | ORAL | 3 refills | Status: DC
Start: 2023-05-25 — End: 2024-04-11

## 2023-05-25 NOTE — Telephone Encounter (Signed)
RX sent to [email or web address] mail order and patient notified VIA phone. Dm/cma

## 2023-05-25 NOTE — Telephone Encounter (Signed)
Prescription Request  05/25/2023  LOV: 11/14/2022  What is the name of the medication or equipment? lisinopril   Have you contacted your pharmacy to request a refill? No  Which pharmacy would you like this sent to?  [email or web address] [facility] - [place], [place] - [address] [place] [address] Phone: [phone] Fax: [phone]    Patient notified that their request is being sent to the clinical staff for review and that they should receive a response within 2 business days.   Please advise at Mobile [phone] (mobile)

## 2023-11-16 ENCOUNTER — Encounter: Payer: Self-pay | Admitting: Family Medicine

## 2023-11-16 ENCOUNTER — Ambulatory Visit (INDEPENDENT_AMBULATORY_CARE_PROVIDER_SITE_OTHER): Payer: PRIVATE HEALTH INSURANCE | Admitting: Family Medicine

## 2023-11-16 VITALS — BP 118/72 | HR 61 | Temp 98.4°F | Ht 64.0 in | Wt 195.4 lb

## 2023-11-16 DIAGNOSIS — Z0000 Encounter for general adult medical examination without abnormal findings: Secondary | ICD-10-CM

## 2023-11-16 DIAGNOSIS — Z780 Asymptomatic menopausal state: Secondary | ICD-10-CM

## 2023-11-16 DIAGNOSIS — I10 Essential (primary) hypertension: Secondary | ICD-10-CM

## 2023-11-16 DIAGNOSIS — N941 Unspecified dyspareunia: Secondary | ICD-10-CM | POA: Diagnosis not present

## 2023-11-16 DIAGNOSIS — R739 Hyperglycemia, unspecified: Secondary | ICD-10-CM | POA: Diagnosis not present

## 2023-11-16 DIAGNOSIS — R7401 Elevation of levels of liver transaminase levels: Secondary | ICD-10-CM

## 2023-11-16 DIAGNOSIS — Z13220 Encounter for screening for lipoid disorders: Secondary | ICD-10-CM

## 2023-11-16 DIAGNOSIS — M5137 Other intervertebral disc degeneration, lumbosacral region with discogenic back pain only: Secondary | ICD-10-CM

## 2023-11-16 LAB — HEMOGLOBIN A1C: Hgb A1c MFr Bld: 5.8 % (ref 4.6–6.5)

## 2023-11-16 MED ORDER — ESTRADIOL 0.05 MG/24HR TD PTWK: 0.0500 mg | MEDICATED_PATCH | TRANSDERMAL | 12 refills | Status: DC

## 2023-11-16 MED ORDER — ESTRADIOL 0.1 MG/GM VA CREA: TOPICAL_CREAM | VAGINAL | 12 refills | Status: DC

## 2023-11-16 MED ORDER — METHOCARBAMOL 500 MG PO TABS
500.0000 mg | ORAL_TABLET | Freq: Four times a day (QID) | ORAL | 0 refills | Status: AC | PRN
Start: 2023-11-16 — End: ?

## 2023-11-16 NOTE — Assessment & Plan Note (Signed)
 Stable. Uses methocarbamol as needed for back discomfort.

## 2023-11-16 NOTE — Addendum Note (Signed)
 Addended by: Tonita Phoenix on: 11/16/2023 02:08 PM   Modules accepted: Orders

## 2023-11-16 NOTE — Assessment & Plan Note (Signed)
 Blood pressure is in good control. Continue amlodipine 10 mg daily and lisinopril 30 mg daily. I will check annual renal labs.

## 2023-11-16 NOTE — Assessment & Plan Note (Signed)
 Discussed risk/benefits of estrogen therapy. Tanya Carr is < 60 and within 10 years of menopause. She is not having vasomotor symptoms. I do not consider her to be at high risk CV wise. She remains concerned about her CV risk, bone health, and brain fog she attributes to her menopause. After shared-decision making, I will proceed with starting her on an estrogen patch. As she has had a prior hysterectomy, cyclic progesterone is not needed.

## 2023-11-16 NOTE — Assessment & Plan Note (Signed)
 Tanya Carr is experiencing some considerable dyspareunia. She may also have an aspect of vulvodynia. I am concerned that the estrogen patch will not resolve any vaginal atrophy very rapidly. I will start her on estradiol vaginal cream daily for 2 weeks and then have her use this twice weekly for maintenance. I did offer referral for pelvic floor physical therapy, but she declines at this point, wanting to see if the estrogens resolve this.

## 2023-11-16 NOTE — Progress Notes (Signed)
 [facility] [address] [place] [address] Dept: [phone] Dept Fax: [phone]  Annual Physical Visit  Subjective:    Patient ID: Tanya Carr, female    DOB: [birth year], 53 y.o..   MRN: [id number]  Chief Complaint  Patient presents with   Annual Exam    CPE/labs.  Fasting today.   ? Hormone therapy   History of Present Illness:  Patient is in today for an annual physical/preventative visit.  Tanya Carr has a history of hypertension. She is managed on amlodipine 10 mg daily and lisinopril 30 mg daily.   Review of Systems  Constitutional:  Positive for weight loss. Negative for chills, diaphoresis, fever and malaise/fatigue.       Notes she had been on Ozempic for about 7 months. Her husband had a supply of Ozempic 1 mg weekly and then had a dosage change. She denies significant side effects from this. She has been off of this for 2 weeks now.   HENT:  Positive for hearing loss and tinnitus. Negative for congestion, ear pain, sinus pain and sore throat.        Notes some mild hearing difficulty and intermittent tinnitus for some time now.  Eyes:  Negative for blurred vision, pain, discharge and redness.  Respiratory:  Negative for cough, shortness of breath and wheezing.   Cardiovascular:  Negative for chest pain and palpitations.  Gastrointestinal:  Negative for abdominal pain, constipation, diarrhea, heartburn, nausea and vomiting.       Had some GI symptoms associated with Ozempic use. Now off this medication.  Genitourinary:        Notes issues with vaginal pain, esp. with intercourse. There may be some degree of pain in the vulva. She has some vaginal dryness, but does not find use of lubricants to be adequate to reduce her pain. She associates these issues with onset of menopause. She also notes some brain fog. She denies any hot flashes. Tanya Carr notes having read about the benefits of postmenopausal  estrogen, she would like to start this.  Musculoskeletal:  Negative for back pain, joint pain and myalgias.  Skin:  Negative for itching and rash.  Psychiatric/Behavioral:  Negative for depression. The patient is not nervous/anxious.    Past Medical History: Patient Active Problem List   Diagnosis Date Noted   Female dyspareunia 11/16/2023   Postmenopausal 11/16/2023   Class 1 obesity due to excess calories with body mass index (BMI) of 34.0 to 34.9 in adult 11/14/2022   Obstructive sleep apnea 08/13/2022   Tailor's bunion of left foot 02/27/2022   Sinus bradycardia 11/12/2020   Essential hypertension 10/20/2017   Anxiety 03/06/2017   DDD (degenerative disc disease), lumbosacral 09/24/2016   Past Surgical History:  Procedure Laterality Date   ABDOMINAL HYSTERECTOMY     CHOLECYSTECTOMY N/A 02/19/2023   Procedure: LAPAROSCOPIC CHOLECYSTECTOMY;  Surgeon: Abigail Miyamoto, MD;  Location: [facility];  Service: General;  Laterality: N/A;   LUMBAR DISC SURGERY     ulnar nerve decompression Bilateral 2011   Family History  Problem Relation Age of Onset   Cancer Mother        Lung   Multiple sclerosis Mother    COPD Father    Pulmonary embolism Brother    Heart disease Maternal Grandmother    Sarcoidosis Paternal Grandmother    Colon cancer Neg Hx    Colon polyps Neg Hx    Esophageal cancer Neg Hx    Rectal cancer  Neg Hx    Stomach cancer Neg Hx    Outpatient Medications Prior to Visit  Medication Sig Dispense Refill   amLODipine (NORVASC) 10 MG tablet Take 1 tablet by mouth daily. 90 tablet 3   ibuprofen (ADVIL) 400 MG tablet Take 1.5 tablets (600 mg total) by mouth every 8 (eight) hours as needed.     lisinopril (ZESTRIL) 30 MG tablet Take 1 tablet (30 mg total) by mouth daily. 90 tablet 3   MAGNESIUM GLYCINATE PO Take 450 mg by mouth daily.     ondansetron (ZOFRAN) 4 MG tablet Take 1 tablet (4 mg total) by mouth daily as needed for nausea or vomiting. 30 tablet 0    oxyCODONE-acetaminophen (PERCOCET/ROXICET) 5-325 MG tablet Take 1 tablet by mouth every 6 (six) hours as needed for severe pain. 15 tablet 0   methocarbamol (ROBAXIN) 500 MG tablet Take 1 tablet (500 mg total) by mouth every 6 (six) hours as needed for muscle spasms. 30 tablet 0   amoxicillin-clavulanate (AUGMENTIN) 875-125 MG tablet Take 1 tablet by mouth every 12 (twelve) hours. 10 tablet 0   omeprazole (PRILOSEC) 20 MG capsule Take 1 capsule (20 mg total) by mouth daily. 30 capsule 0   No facility-administered medications prior to visit.   Allergies  Allergen Reactions   Bee Pollen    Food Hives    Seafood allergy: hives and itching   Shellfish Allergy    Vit D-Vit E-Safflower Oil    Vitamin D Analogs    Objective:   Today's Vitals   11/16/23 0807  BP: 118/72  Pulse: 61  Temp: 98.4 F (36.9 C)  TempSrc: Temporal  SpO2: 98%  Weight: 195 lb 6.4 oz (88.6 kg)  Height: 5\' 4"  (1.626 m)   Body mass index is 33.54 kg/m.   General: Well developed, well nourished. No acute distress. HEENT: Normocephalic, non-traumatic. PERRL, EOMI. Conjunctiva clear. External ears normal. EAC and TMs   normal bilaterally. Nose clear without congestion or rhinorrhea. Mucous membranes moist. Oropharynx clear. Good   dentition. Neck: Supple. No lymphadenopathy. No thyromegaly. Lungs: Clear to auscultation bilaterally. No wheezing, rales or rhonchi. CV: RRR without murmurs or rubs. Pulses 2+ bilaterally. Abdomen: Soft, non-tender. Bowel sounds positive, normal pitch and frequency. No hepatosplenomegaly. No   rebound or guarding. Extremities: Full ROM. No joint swelling or tenderness. No edema noted. Skin: Warm and dry. No rashes. Psych: Alert and oriented. Normal mood and affect.  Health Maintenance Due  Topic Date Due   Hepatitis C Screening  Never done   Lab Results Last lipids Lab Results  Component Value Date   CHOL 180 11/12/2021   HDL 63.00 11/12/2021   LDLCALC 104 (H) 11/12/2021    TRIG 69.0 11/12/2021   CHOLHDL 3 11/12/2021     Assessment & Plan:   Problem List Items Addressed This Visit       Cardiovascular and Mediastinum   Essential hypertension   Blood pressure is in good control. Continue amlodipine 10 mg daily and lisinopril 30 mg daily. I will check annual renal labs.      Relevant Orders   Comprehensive metabolic panel     Musculoskeletal and Integument   DDD (degenerative disc disease), lumbosacral   Stable. Uses methocarbamol as needed for back discomfort.      Relevant Medications   methocarbamol (ROBAXIN) 500 MG tablet     Other   Female dyspareunia   Tanya Carr is experiencing some considerable dyspareunia. She may also have an aspect of  vulvodynia. I am concerned that the estrogen patch will not resolve any vaginal atrophy very rapidly. I will start her on estradiol vaginal cream daily for 2 weeks and then have her use this twice weekly for maintenance. I did offer referral for pelvic floor physical therapy, but she declines at this point, wanting to see if the estrogens resolve this.      Relevant Medications   estradiol (ESTRACE) 0.1 MG/GM vaginal cream   Postmenopausal   Discussed risk/benefits of estrogen therapy. Tanya Carr is < 60 and within 10 years of menopause. She is not having vasomotor symptoms. I do not consider her to be at high risk CV wise. She remains concerned about her CV risk, bone health, and brain fog she attributes to her menopause. After shared-decision making, I will proceed with starting her on an estrogen patch. As she has had a prior hysterectomy, cyclic progesterone is not needed.      Relevant Medications   estradiol (CLIMARA - DOSED IN MG/24 HR) 0.05 mg/24hr patch   Other Visit Diagnoses       Annual physical exam    -  Primary   Overall good health. Encourage ongoing regular exercise. Discussed recommended screenings and immunizations.     Hyperglycemia       Tanya Carr had some elevated blood sugars  around the time of her surgery last spring. I will check a glucose and A1c today.   Relevant Orders   Comprehensive metabolic panel   Hemoglobin A1c     Screening for lipid disorders       Past lipids have been essentially normal. Advised against screening more frequently than every 5 years. Patient would like to proceed anyway.   Relevant Orders   Lipid panel     Elevated transaminase level       Had an elevated transaminase level at the time of her surgery last spring. I will reassess this today.   Relevant Orders   Comprehensive metabolic panel       Return in about 2 months (around 01/16/2024) for Reassessment.   Loyola Mast, MD

## 2023-11-17 ENCOUNTER — Other Ambulatory Visit (INDEPENDENT_AMBULATORY_CARE_PROVIDER_SITE_OTHER): Payer: PRIVATE HEALTH INSURANCE

## 2023-11-17 ENCOUNTER — Encounter: Payer: Self-pay | Admitting: Family Medicine

## 2023-11-17 DIAGNOSIS — I10 Essential (primary) hypertension: Secondary | ICD-10-CM

## 2023-11-17 LAB — LIPID PANEL
Cholesterol: 166 mg/dL (ref 0–200)
HDL: 58.7 mg/dL (ref >39.00)
LDL Cholesterol: 94 mg/dL (ref 0–99)
NonHDL: 106.94
Total CHOL/HDL Ratio: 3
Triglycerides: 64 mg/dL (ref 0.0–149.0)
VLDL: 12.8 mg/dL (ref 0.0–40.0)

## 2023-11-17 LAB — COMPREHENSIVE METABOLIC PANEL
ALT: 14 U/L (ref 0–35)
AST: 16 U/L (ref 0–37)
Albumin: 4.5 g/dL (ref 3.5–5.2)
Alkaline Phosphatase: 61 U/L (ref 39–117)
BUN: 17 mg/dL (ref 6–23)
CO2: 30 meq/L (ref 19–32)
Calcium: 9.5 mg/dL (ref 8.4–10.5)
Chloride: 99 meq/L (ref 96–112)
Creatinine, Ser: 0.91 mg/dL (ref 0.40–1.20)
GFR: 71.56 mL/min (ref >60.00)
Glucose, Bld: 89 mg/dL (ref 70–99)
Potassium: 3.9 meq/L (ref 3.5–5.1)
Sodium: 136 meq/L (ref 135–145)
Total Bilirubin: 0.6 mg/dL (ref 0.2–1.2)
Total Protein: 7.4 g/dL (ref 6.0–8.3)

## 2023-11-17 NOTE — Progress Notes (Deleted)
 Marland Kitchen

## 2023-11-23 NOTE — Addendum Note (Signed)
 Addended by: Loyola Mast on: 11/23/2023 08:14 AM   Modules accepted: Orders

## 2023-12-08 ENCOUNTER — Encounter: Payer: Self-pay | Admitting: Family Medicine

## 2023-12-08 DIAGNOSIS — N951 Menopausal and female climacteric states: Secondary | ICD-10-CM | POA: Insufficient documentation

## 2023-12-08 MED ORDER — ESTRADIOL 0.025 MG/24HR TD PTTW: 1.0000 | MEDICATED_PATCH | TRANSDERMAL | 12 refills | Status: DC

## 2023-12-17 ENCOUNTER — Other Ambulatory Visit: Payer: Self-pay | Admitting: Family Medicine

## 2023-12-17 DIAGNOSIS — Z1231 Encounter for screening mammogram for malignant neoplasm of breast: Secondary | ICD-10-CM

## 2024-01-04 ENCOUNTER — Other Ambulatory Visit: Payer: Self-pay | Admitting: Family Medicine

## 2024-01-04 DIAGNOSIS — N941 Unspecified dyspareunia: Secondary | ICD-10-CM

## 2024-01-05 ENCOUNTER — Telehealth: Payer: Self-pay

## 2024-01-05 NOTE — Telephone Encounter (Signed)
 Copied from CRM [id number]. Topic: Clinical - Medication Question >> Jan 05, 2024 10:01 AM Allyne Areola wrote: Reason for CRM: [facility] is calling regarding patient's prescription for estradiol  (ESTRACE ) 0.1 MG/GM vaginal cream they need clarifications. Best call back number [phone].

## 2024-01-05 NOTE — Telephone Encounter (Signed)
 Spoke to the pharmacy and gave instruction to use 4 gms (1 applicator full) as directed. Dm/cma

## 2024-01-06 ENCOUNTER — Ambulatory Visit
Admission: RE | Admit: 2024-01-06 | Discharge: 2024-01-06 | Disposition: A | Discharge status: Home / Self Care | Payer: PRIVATE HEALTH INSURANCE | Source: Ambulatory Visit

## 2024-01-06 DIAGNOSIS — Z1231 Encounter for screening mammogram for malignant neoplasm of breast: Secondary | ICD-10-CM

## 2024-01-07 ENCOUNTER — Other Ambulatory Visit (HOSPITAL_COMMUNITY): Payer: Self-pay

## 2024-01-07 ENCOUNTER — Telehealth: Payer: Self-pay

## 2024-01-07 ENCOUNTER — Encounter: Payer: Self-pay | Admitting: Family Medicine

## 2024-01-07 ENCOUNTER — Other Ambulatory Visit: Payer: Self-pay | Admitting: Family Medicine

## 2024-01-07 DIAGNOSIS — N941 Unspecified dyspareunia: Secondary | ICD-10-CM

## 2024-01-07 DIAGNOSIS — N951 Menopausal and female climacteric states: Secondary | ICD-10-CM

## 2024-01-07 MED ORDER — ESTRADIOL 0.025 MG/24HR TD PTTW
1.0000 | MEDICATED_PATCH | TRANSDERMAL | 12 refills | Status: DC
Start: 2024-01-07 — End: 2024-01-07

## 2024-01-07 MED ORDER — ESTRADIOL 0.1 MG/GM VA CREA: TOPICAL_CREAM | VAGINAL | 3 refills | Status: DC

## 2024-01-07 MED ORDER — ESTRADIOL 0.1 MG/GM VA CREA: TOPICAL_CREAM | VAGINAL | 13 refills | Status: DC

## 2024-01-07 MED ORDER — ESTRADIOL 0.025 MG/24HR TD PTTW: 1.0000 | MEDICATED_PATCH | TRANSDERMAL | 3 refills | Status: DC

## 2024-01-07 NOTE — Addendum Note (Signed)
 Addended by: Vergil Glasser on: 01/07/2024 12:07 PM   Modules accepted: Orders

## 2024-01-07 NOTE — Telephone Encounter (Signed)
 Pharmacy Patient Advocate Encounter   Received notification from Onbase that prior authorization for Estradiol  0.025MG /24HR biweekly patches is required/requested.   Insurance verification completed.   The patient is insured through [facility] .   Per test claim: PA required; PA submitted to above mentioned insurance via CoverMyMeds Key/confirmation #/EOC [id number] Status is pending

## 2024-01-07 NOTE — Telephone Encounter (Signed)
 1 gram (applicator) twice a week.

## 2024-01-08 NOTE — Telephone Encounter (Signed)
 Called pharmacy to receive clarification on what is needed for the script. LVM to cb.

## 2024-01-11 ENCOUNTER — Other Ambulatory Visit (HOSPITAL_COMMUNITY): Payer: Self-pay

## 2024-01-11 ENCOUNTER — Telehealth: Payer: Self-pay | Admitting: Pharmacy Technician

## 2024-01-11 NOTE — Telephone Encounter (Signed)
 Pharmacy Patient Advocate Encounter   Received notification from CoverMyMeds that prior authorization for Estradiol  0.1MG /GM cream is required/requested.   Insurance verification completed.   The patient is insured through [facility] .   Per test claim: PA required; PA submitted to above mentioned insurance via CoverMyMeds Key/confirmation #/EOC [id number] Status is pending

## 2024-01-11 NOTE — Telephone Encounter (Signed)
 Noted. Tanya Carr

## 2024-01-12 NOTE — Telephone Encounter (Signed)
 Pharmacy Patient Advocate Encounter  Received notification from [facility] that Prior Authorization for Estradiol  0.25MG /24HR biweekly patch has been CANCELLED due to

## 2024-01-13 ENCOUNTER — Other Ambulatory Visit (HOSPITAL_COMMUNITY): Payer: Self-pay

## 2024-01-14 NOTE — Telephone Encounter (Signed)
 Spoke with Isa Manuel at [facility]. The NDC numbers are different for the script and what is on formulary, whic is why the script they received on 01/07/24 is not accepted. Provided a verbal order for the script of estradiol  (MINIVELLE ) 0.025 MG/24HR. Place 1 patch onto the skin 2 (two) times a week. Dispense: 24 patch, Refills: 3 ordered. Provider name and address verified. Isa Manuel states pt will receive notification shortly for payment to be made for medication. No further action at this time.

## 2024-02-16 ENCOUNTER — Other Ambulatory Visit: Payer: Self-pay | Admitting: Family Medicine

## 2024-02-16 DIAGNOSIS — I10 Essential (primary) hypertension: Secondary | ICD-10-CM

## 2024-02-24 ENCOUNTER — Encounter: Payer: Self-pay | Admitting: Family Medicine

## 2024-02-24 DIAGNOSIS — E6609 Other obesity due to excess calories: Secondary | ICD-10-CM

## 2024-02-24 MED ORDER — ZEPBOUND 2.5 MG/0.5ML ~~LOC~~ SOAJ: 2.5000 mg | SUBCUTANEOUS | 0 refills | Status: DC

## 2024-02-26 MED ORDER — TIRZEPATIDE-WEIGHT MANAGEMENT 2.5 MG/0.5ML ~~LOC~~ SOLN: 2.5000 mg | SUBCUTANEOUS | 1 refills | Status: DC

## 2024-02-26 NOTE — Addendum Note (Signed)
 Addended by: THEDORA GARNETTE HERO on: 02/26/2024 04:37 PM   Modules accepted: Orders

## 2024-03-09 MED ORDER — TIRZEPATIDE-WEIGHT MANAGEMENT 5 MG/0.5ML ~~LOC~~ SOLN: 5.0000 mg | SUBCUTANEOUS | 3 refills | Status: DC

## 2024-03-09 NOTE — Addendum Note (Signed)
 Addended by: THEDORA GARNETTE HERO on: 03/09/2024 08:18 AM   Modules accepted: Orders

## 2024-04-08 ENCOUNTER — Ambulatory Visit (INDEPENDENT_AMBULATORY_CARE_PROVIDER_SITE_OTHER): Admitting: Family Medicine

## 2024-04-08 ENCOUNTER — Encounter: Payer: Self-pay | Admitting: Family Medicine

## 2024-04-08 VITALS — BP 118/80 | HR 53 | Temp 97.6°F | Ht 64.0 in | Wt 198.8 lb

## 2024-04-08 DIAGNOSIS — I10 Essential (primary) hypertension: Secondary | ICD-10-CM

## 2024-04-08 DIAGNOSIS — E6609 Other obesity due to excess calories: Secondary | ICD-10-CM | POA: Diagnosis not present

## 2024-04-08 DIAGNOSIS — N951 Menopausal and female climacteric states: Secondary | ICD-10-CM | POA: Diagnosis not present

## 2024-04-08 DIAGNOSIS — E66811 Obesity, class 1: Secondary | ICD-10-CM | POA: Diagnosis not present

## 2024-04-08 DIAGNOSIS — Z6834 Body mass index (BMI) 34.0-34.9, adult: Secondary | ICD-10-CM

## 2024-04-08 NOTE — Assessment & Plan Note (Signed)
 Improved. Continue estradiol  0.025 mg/24 hr patch.

## 2024-04-08 NOTE — Progress Notes (Signed)
 [facility] [address] [place] [address] Dept: [phone] Dept Fax: [phone]  Chronic Care Office Visit  Subjective:    Patient ID: Tanya Carr, female    DOB: Jan 26, 1971, 53 y.o..   MRN: [id number]  Chief Complaint  Patient presents with   Weight Check    Patient states weight loss medication is going well. Nothing else to discuss.    History of Present Illness:  Patient is in today for reassessment of chronic medical issues.  Ms. Bosch has a history of hypertension. She is managed on amlodipine  10 mg daily and lisinopril  30 mg daily.   Ms. Alarid has a history of  dyspareunia and vaginal dryness. She had also been experiencing brain fog since her menopause. I started her initially on an estradiol  vaginal cream as well as an estrogen patch. She notes her symptoms have been much improved. She states she had not realized how much lack of estrogen had played a role in many of her constitutional symptoms.   Ms. Mareno has a history of class 1 obesity. She is now on tirzepatide  and is on her 2nd week of the 5 mg weekly dose. She feels she is tolerating this well. She is getting ready to go on a trip to [place], so plans to interrupt her therapy while away.  Past Medical History: Patient Active Problem List   Diagnosis Date Noted   Vasomotor symptoms due to menopause 12/08/2023   Female dyspareunia 11/16/2023   Postmenopausal 11/16/2023   Class 1 obesity due to excess calories with body mass index (BMI) of 34.0 to 34.9 in adult 11/14/2022   Obstructive sleep apnea 08/13/2022   Tailor's bunion of left foot 02/27/2022   Sinus bradycardia 11/12/2020   Essential hypertension 10/20/2017   Anxiety 03/06/2017   DDD (degenerative disc disease), lumbosacral 09/24/2016   Past Surgical History:  Procedure Laterality Date   ABDOMINAL HYSTERECTOMY     CHOLECYSTECTOMY N/A 02/19/2023   Procedure: LAPAROSCOPIC  CHOLECYSTECTOMY;  Surgeon: Vernetta Berg, MD;  Location: [facility];  Service: General;  Laterality: N/A;   LUMBAR DISC SURGERY     ulnar nerve decompression Bilateral 2011   Family History  Problem Relation Age of Onset   Cancer Mother        Lung   Multiple sclerosis Mother    COPD Father    Pulmonary embolism Brother    Heart disease Maternal Grandmother    Sarcoidosis Paternal Grandmother    Colon cancer Neg Hx    Colon polyps Neg Hx    Esophageal cancer Neg Hx    Rectal cancer Neg Hx    Stomach cancer Neg Hx    Outpatient Medications Prior to Visit  Medication Sig Dispense Refill   amLODipine  (NORVASC ) 10 MG tablet Take 1 tablet by mouth daily. 90 tablet 2   estradiol  (ESTRACE ) 0.1 MG/GM vaginal cream Insert 1 applicatorful vaginally twice weekly. 42 g 13   estradiol  (MINIVELLE ) 0.025 MG/24HR Place 1 patch onto the skin 2 (two) times a week. 24 patch 3   ibuprofen  (ADVIL ) 400 MG tablet Take 1.5 tablets (600 mg total) by mouth every 8 (eight) hours as needed.     lisinopril  (ZESTRIL ) 30 MG tablet Take 1 tablet (30 mg total) by mouth daily. 90 tablet 3   MAGNESIUM GLYCINATE PO Take 450 mg by mouth daily.     methocarbamol  (ROBAXIN ) 500 MG tablet Take 1 tablet (500 mg total) by mouth every 6 (six) hours  as needed for muscle spasms. 30 tablet 0   oxyCODONE -acetaminophen  (PERCOCET/ROXICET) 5-325 MG tablet Take 1 tablet by mouth every 6 (six) hours as needed for severe pain. 15 tablet 0   tirzepatide  5 MG/0.5ML injection vial Inject 5 mg into the skin once a week. 2 mL 3   estradiol  (ESTRACE ) 0.1 MG/GM vaginal cream PLACE 1 APPLICATORFUL 2 TIMES A WEEK. (Patient not taking: Reported on 04/08/2024) 127.5 g 3   tirzepatide  (ZEPBOUND ) 2.5 MG/0.5ML injection vial Inject 2.5 mg into the skin once a week. (Patient not taking: Reported on 04/08/2024) 2 mL 1   No facility-administered medications prior to visit.   Allergies  Allergen Reactions   Bee Pollen    Food Hives    Seafood allergy:  hives and itching   Shellfish Allergy    Vit D-Vit E-Safflower Oil    Vitamin D  Analogs    Objective:   Today's Vitals   04/08/24 0837  BP: 118/80  Pulse: (!) 53  Temp: 97.6 F (36.4 C)  TempSrc: Oral  SpO2: 93%  Weight: 198 lb 12.8 oz (90.2 kg)  Height: 5' 4 (1.626 m)   Body mass index is 34.12 kg/m.   General: Well developed, well nourished. No acute distress. Psych: Alert and oriented. Normal mood and affect.  Health Maintenance Due  Topic Date Due   Hepatitis C Screening  Never done     Assessment & Plan:   Problem List Items Addressed This Visit       Cardiovascular and Mediastinum   Essential hypertension - Primary   Blood pressure is in good control. Continue amlodipine  10 mg daily and lisinopril  30 mg daily.        Other   Class 1 obesity due to excess calories with body mass index (BMI) of 34.0 to 34.9 in adult   Maximum weight: 201 lbs (10/2022) Current weight: 198 lbs Weight change since last visit: + 3 lbs Total weight loss: - 3 lbs (1.5 %)  We will continue the 5 mg weekly dose of tirzepatide  for now. Encourage ongoing efforts at regular exercise.      Vasomotor symptoms due to menopause   Improved. Continue estradiol  0.025 mg/24 hr patch.       Return in about 3 months (around 07/09/2024) for Reassessment.   Garnette CHRISTELLA Simpler, MD

## 2024-04-08 NOTE — Assessment & Plan Note (Signed)
 Maximum weight: 201 lbs (10/2022) Current weight: 198 lbs Weight change since last visit: + 3 lbs Total weight loss: - 3 lbs (1.5 %)  We will continue the 5 mg weekly dose of tirzepatide  for now. Encourage ongoing efforts at regular exercise.

## 2024-04-08 NOTE — Assessment & Plan Note (Signed)
Blood pressure is in good control. Continue amlodipine 10 mg daily and lisinopril 30 mg daily.

## 2024-04-10 ENCOUNTER — Other Ambulatory Visit: Payer: Self-pay | Admitting: Family Medicine

## 2024-04-10 DIAGNOSIS — I10 Essential (primary) hypertension: Secondary | ICD-10-CM

## 2024-06-30 ENCOUNTER — Other Ambulatory Visit: Payer: Self-pay | Admitting: Family Medicine

## 2024-06-30 DIAGNOSIS — E6609 Other obesity due to excess calories: Secondary | ICD-10-CM

## 2024-07-05 ENCOUNTER — Encounter: Payer: Self-pay | Admitting: Family Medicine

## 2024-07-05 DIAGNOSIS — E66811 Other obesity due to excess calories: Secondary | ICD-10-CM

## 2024-07-05 MED ORDER — ZEPBOUND 7.5 MG/0.5ML ~~LOC~~ SOLN
7.5000 mg | SUBCUTANEOUS | 3 refills | Status: DC
Start: 2024-07-05 — End: 2024-07-05

## 2024-07-05 MED ORDER — ZEPBOUND 7.5 MG/0.5ML ~~LOC~~ SOLN: 7.5000 mg | SUBCUTANEOUS | 3 refills | Status: DC

## 2024-08-24 ENCOUNTER — Other Ambulatory Visit: Payer: Self-pay | Admitting: Family Medicine

## 2024-08-24 DIAGNOSIS — N951 Menopausal and female climacteric states: Secondary | ICD-10-CM

## 2024-10-07 ENCOUNTER — Other Ambulatory Visit: Payer: Self-pay | Admitting: Family Medicine

## 2024-10-07 DIAGNOSIS — E66811 Obesity, class 1: Secondary | ICD-10-CM

## 2024-10-12 ENCOUNTER — Ambulatory Visit: Admitting: Family Medicine

## 2024-11-25 ENCOUNTER — Encounter: Admitting: Family Medicine
